# Patient Record
Sex: Female | Born: 1975 | Race: Black or African American | Hispanic: No | Marital: Single | Smoking: Never smoker
Health system: Southern US, Community
[De-identification: ages and names within clinical notes are randomized; demographics above are authoritative.]

## PROBLEM LIST (undated history)

## (undated) DIAGNOSIS — I10 Essential (primary) hypertension: Secondary | ICD-10-CM

## (undated) DIAGNOSIS — R569 Unspecified convulsions: Secondary | ICD-10-CM

---

## 2017-07-30 ENCOUNTER — Emergency Department (HOSPITAL_COMMUNITY): Payer: Medicaid Other

## 2017-07-30 ENCOUNTER — Inpatient Hospital Stay (HOSPITAL_COMMUNITY): Payer: Medicaid Other | Admitting: Certified Registered Nurse Anesthetist

## 2017-07-30 ENCOUNTER — Encounter (HOSPITAL_COMMUNITY): Payer: Self-pay | Admitting: Emergency Medicine

## 2017-07-30 ENCOUNTER — Inpatient Hospital Stay (HOSPITAL_COMMUNITY)
Admission: EM | Admit: 2017-07-30 | Discharge: 2017-09-01 | DRG: 020 | Disposition: A | Discharge status: Rehabilitation Facility | Payer: Medicaid Other | Attending: Neurosurgery | Admitting: Neurosurgery

## 2017-07-30 ENCOUNTER — Encounter (HOSPITAL_COMMUNITY)
Admission: EM | Disposition: A | Discharge status: Rehabilitation Facility | Payer: Self-pay | Source: Home / Self Care | Attending: Neurosurgery

## 2017-07-30 DIAGNOSIS — D72829 Elevated white blood cell count, unspecified: Secondary | ICD-10-CM | POA: Diagnosis not present

## 2017-07-30 DIAGNOSIS — Z09 Encounter for follow-up examination after completed treatment for conditions other than malignant neoplasm: Secondary | ICD-10-CM

## 2017-07-30 DIAGNOSIS — Z0189 Encounter for other specified special examinations: Secondary | ICD-10-CM

## 2017-07-30 DIAGNOSIS — Y95 Nosocomial condition: Secondary | ICD-10-CM | POA: Diagnosis not present

## 2017-07-30 DIAGNOSIS — G914 Hydrocephalus in diseases classified elsewhere: Secondary | ICD-10-CM | POA: Diagnosis present

## 2017-07-30 DIAGNOSIS — I606 Nontraumatic subarachnoid hemorrhage from other intracranial arteries: Principal | ICD-10-CM | POA: Diagnosis present

## 2017-07-30 DIAGNOSIS — D6489 Other specified anemias: Secondary | ICD-10-CM | POA: Diagnosis present

## 2017-07-30 DIAGNOSIS — Z823 Family history of stroke: Secondary | ICD-10-CM | POA: Diagnosis not present

## 2017-07-30 DIAGNOSIS — S06350S Traumatic hemorrhage of left cerebrum without loss of consciousness, sequela: Secondary | ICD-10-CM | POA: Diagnosis not present

## 2017-07-30 DIAGNOSIS — R509 Fever, unspecified: Secondary | ICD-10-CM | POA: Diagnosis not present

## 2017-07-30 DIAGNOSIS — Z781 Physical restraint status: Secondary | ICD-10-CM | POA: Diagnosis not present

## 2017-07-30 DIAGNOSIS — R5082 Postprocedural fever: Secondary | ICD-10-CM | POA: Diagnosis not present

## 2017-07-30 DIAGNOSIS — I611 Nontraumatic intracerebral hemorrhage in hemisphere, cortical: Secondary | ICD-10-CM | POA: Diagnosis not present

## 2017-07-30 DIAGNOSIS — R739 Hyperglycemia, unspecified: Secondary | ICD-10-CM | POA: Diagnosis not present

## 2017-07-30 DIAGNOSIS — J181 Lobar pneumonia, unspecified organism: Secondary | ICD-10-CM | POA: Diagnosis not present

## 2017-07-30 DIAGNOSIS — D62 Acute posthemorrhagic anemia: Secondary | ICD-10-CM | POA: Diagnosis not present

## 2017-07-30 DIAGNOSIS — E872 Acidosis: Secondary | ICD-10-CM | POA: Diagnosis present

## 2017-07-30 DIAGNOSIS — I609 Nontraumatic subarachnoid hemorrhage, unspecified: Secondary | ICD-10-CM

## 2017-07-30 DIAGNOSIS — K567 Ileus, unspecified: Secondary | ICD-10-CM | POA: Diagnosis not present

## 2017-07-30 DIAGNOSIS — I69018 Other symptoms and signs involving cognitive functions following nontraumatic subarachnoid hemorrhage: Secondary | ICD-10-CM | POA: Diagnosis not present

## 2017-07-30 DIAGNOSIS — J189 Pneumonia, unspecified organism: Secondary | ICD-10-CM

## 2017-07-30 DIAGNOSIS — R1313 Dysphagia, pharyngeal phase: Secondary | ICD-10-CM | POA: Diagnosis not present

## 2017-07-30 DIAGNOSIS — R569 Unspecified convulsions: Secondary | ICD-10-CM

## 2017-07-30 DIAGNOSIS — R1310 Dysphagia, unspecified: Secondary | ICD-10-CM | POA: Diagnosis not present

## 2017-07-30 DIAGNOSIS — G8191 Hemiplegia, unspecified affecting right dominant side: Secondary | ICD-10-CM | POA: Diagnosis present

## 2017-07-30 DIAGNOSIS — G40909 Epilepsy, unspecified, not intractable, without status epilepticus: Secondary | ICD-10-CM | POA: Diagnosis present

## 2017-07-30 DIAGNOSIS — E876 Hypokalemia: Secondary | ICD-10-CM | POA: Diagnosis present

## 2017-07-30 DIAGNOSIS — I69093 Ataxia following nontraumatic subarachnoid hemorrhage: Secondary | ICD-10-CM | POA: Diagnosis not present

## 2017-07-30 DIAGNOSIS — I10 Essential (primary) hypertension: Secondary | ICD-10-CM | POA: Diagnosis present

## 2017-07-30 DIAGNOSIS — I671 Cerebral aneurysm, nonruptured: Secondary | ICD-10-CM | POA: Diagnosis not present

## 2017-07-30 DIAGNOSIS — J9601 Acute respiratory failure with hypoxia: Secondary | ICD-10-CM

## 2017-07-30 DIAGNOSIS — R1312 Dysphagia, oropharyngeal phase: Secondary | ICD-10-CM | POA: Diagnosis not present

## 2017-07-30 DIAGNOSIS — A419 Sepsis, unspecified organism: Secondary | ICD-10-CM | POA: Diagnosis not present

## 2017-07-30 DIAGNOSIS — R1314 Dysphagia, pharyngoesophageal phase: Secondary | ICD-10-CM | POA: Diagnosis not present

## 2017-07-30 DIAGNOSIS — I629 Nontraumatic intracranial hemorrhage, unspecified: Secondary | ICD-10-CM | POA: Diagnosis not present

## 2017-07-30 DIAGNOSIS — G9349 Other encephalopathy: Secondary | ICD-10-CM | POA: Diagnosis present

## 2017-07-30 DIAGNOSIS — I610 Nontraumatic intracerebral hemorrhage in hemisphere, subcortical: Secondary | ICD-10-CM | POA: Diagnosis not present

## 2017-07-30 DIAGNOSIS — R269 Unspecified abnormalities of gait and mobility: Secondary | ICD-10-CM | POA: Diagnosis not present

## 2017-07-30 DIAGNOSIS — G9389 Other specified disorders of brain: Secondary | ICD-10-CM | POA: Diagnosis present

## 2017-07-30 DIAGNOSIS — I161 Hypertensive emergency: Secondary | ICD-10-CM | POA: Diagnosis present

## 2017-07-30 DIAGNOSIS — Z978 Presence of other specified devices: Secondary | ICD-10-CM | POA: Diagnosis not present

## 2017-07-30 DIAGNOSIS — J9690 Respiratory failure, unspecified, unspecified whether with hypoxia or hypercapnia: Secondary | ICD-10-CM

## 2017-07-30 DIAGNOSIS — Z4659 Encounter for fitting and adjustment of other gastrointestinal appliance and device: Secondary | ICD-10-CM

## 2017-07-30 DIAGNOSIS — I69151 Hemiplegia and hemiparesis following nontraumatic intracerebral hemorrhage affecting right dominant side: Secondary | ICD-10-CM | POA: Diagnosis not present

## 2017-07-30 DIAGNOSIS — I69919 Unspecified symptoms and signs involving cognitive functions following unspecified cerebrovascular disease: Secondary | ICD-10-CM | POA: Diagnosis not present

## 2017-07-30 DIAGNOSIS — I67848 Other cerebrovascular vasospasm and vasoconstriction: Secondary | ICD-10-CM | POA: Diagnosis present

## 2017-07-30 DIAGNOSIS — R4189 Other symptoms and signs involving cognitive functions and awareness: Secondary | ICD-10-CM | POA: Diagnosis present

## 2017-07-30 DIAGNOSIS — I69398 Other sequelae of cerebral infarction: Secondary | ICD-10-CM | POA: Diagnosis not present

## 2017-07-30 DIAGNOSIS — K9189 Other postprocedural complications and disorders of digestive system: Secondary | ICD-10-CM | POA: Diagnosis not present

## 2017-07-30 DIAGNOSIS — I619 Nontraumatic intracerebral hemorrhage, unspecified: Secondary | ICD-10-CM | POA: Diagnosis present

## 2017-07-30 HISTORY — PX: IR ANGIOGRAM FOLLOW UP STUDY: IMG697

## 2017-07-30 HISTORY — PX: IR TRANSCATH/EMBOLIZ: IMG695

## 2017-07-30 HISTORY — PX: IR 3D INDEPENDENT WKST: IMG2385

## 2017-07-30 HISTORY — PX: IR ANGIO VERTEBRAL SEL VERTEBRAL UNI R MOD SED: IMG5368

## 2017-07-30 HISTORY — DX: Unspecified convulsions: R56.9

## 2017-07-30 HISTORY — PX: IR ANGIO INTRA EXTRACRAN SEL INTERNAL CAROTID BILAT MOD SED: IMG5363

## 2017-07-30 HISTORY — PX: RADIOLOGY WITH ANESTHESIA: SHX6223

## 2017-07-30 HISTORY — DX: Essential (primary) hypertension: I10

## 2017-07-30 LAB — CBC
HCT: 28.2 % — ABNORMAL LOW (ref 36.0–46.0)
HEMOGLOBIN: 8.6 g/dL — AB (ref 12.0–15.0)
MCH: 26.6 pg (ref 26.0–34.0)
MCHC: 30.5 g/dL (ref 30.0–36.0)
MCV: 87.3 fL (ref 78.0–100.0)
Platelets: 287 10*3/uL (ref 150–400)
RBC: 3.23 MIL/uL — AB (ref 3.87–5.11)
RDW: 18.2 % — ABNORMAL HIGH (ref 11.5–15.5)
WBC: 13.1 10*3/uL — ABNORMAL HIGH (ref 4.0–10.5)

## 2017-07-30 LAB — RAPID URINE DRUG SCREEN, HOSP PERFORMED
AMPHETAMINES: NOT DETECTED
Barbiturates: NOT DETECTED
Benzodiazepines: NOT DETECTED
COCAINE: NOT DETECTED
OPIATES: NOT DETECTED
TETRAHYDROCANNABINOL: NOT DETECTED

## 2017-07-30 LAB — I-STAT ARTERIAL BLOOD GAS, ED
ACID-BASE EXCESS: 1 mmol/L (ref 0.0–2.0)
Bicarbonate: 26.8 mmol/L (ref 20.0–28.0)
O2 SAT: 100 %
TCO2: 28 mmol/L (ref 22–32)
pCO2 arterial: 47.8 mmHg (ref 32.0–48.0)
pH, Arterial: 7.355 (ref 7.350–7.450)
pO2, Arterial: 397 mmHg — ABNORMAL HIGH (ref 83.0–108.0)

## 2017-07-30 LAB — BASIC METABOLIC PANEL
ANION GAP: 14 (ref 5–15)
BUN: 14 mg/dL (ref 6–20)
CALCIUM: 8.6 mg/dL — AB (ref 8.9–10.3)
CO2: 17 mmol/L — AB (ref 22–32)
Chloride: 104 mmol/L (ref 101–111)
Creatinine, Ser: 0.79 mg/dL (ref 0.44–1.00)
Glucose, Bld: 121 mg/dL — ABNORMAL HIGH (ref 65–99)
Potassium: 2.6 mmol/L — CL (ref 3.5–5.1)
Sodium: 135 mmol/L (ref 135–145)

## 2017-07-30 LAB — BLOOD GAS, ARTERIAL
Acid-base deficit: 5.9 mmol/L — ABNORMAL HIGH (ref 0.0–2.0)
BICARBONATE: 18.2 mmol/L — AB (ref 20.0–28.0)
Drawn by: 51133
FIO2: 40
LHR: 14 {breaths}/min
O2 Saturation: 99.3 %
PATIENT TEMPERATURE: 97.8
PEEP/CPAP: 5 cmH2O
PO2 ART: 211 mmHg — AB (ref 83.0–108.0)
VT: 420 mL
pCO2 arterial: 30.5 mmHg — ABNORMAL LOW (ref 32.0–48.0)
pH, Arterial: 7.391 (ref 7.350–7.450)

## 2017-07-30 LAB — CBG MONITORING, ED: GLUCOSE-CAPILLARY: 134 mg/dL — AB (ref 65–99)

## 2017-07-30 LAB — I-STAT BETA HCG BLOOD, ED (MC, WL, AP ONLY)

## 2017-07-30 LAB — PROTIME-INR
INR: 1.07
Prothrombin Time: 13.8 seconds (ref 11.4–15.2)

## 2017-07-30 LAB — APTT: aPTT: 37 seconds — ABNORMAL HIGH (ref 24–36)

## 2017-07-30 LAB — MRSA PCR SCREENING: MRSA BY PCR: NEGATIVE

## 2017-07-30 SURGERY — RADIOLOGY WITH ANESTHESIA
Anesthesia: General

## 2017-07-30 MED ORDER — LORAZEPAM 2 MG/ML IJ SOLN
INTRAMUSCULAR | Status: AC
  Filled 2017-07-30: qty 1

## 2017-07-30 MED ORDER — ORAL CARE MOUTH RINSE
15.0000 mL | Freq: Four times a day (QID) | OROMUCOSAL | Status: DC
  Administered 2017-07-30: 15 mL via OROMUCOSAL

## 2017-07-30 MED ORDER — PANTOPRAZOLE SODIUM 40 MG PO TBEC: 40.0000 mg | DELAYED_RELEASE_TABLET | Freq: Every day | ORAL | Status: DC

## 2017-07-30 MED ORDER — PROPOFOL 1000 MG/100ML IV EMUL
5.0000 ug/kg/min | Freq: Once | INTRAVENOUS | Status: DC
  Administered 2017-07-30: 20 ug/kg/min via INTRAVENOUS

## 2017-07-30 MED ORDER — METOPROLOL TARTRATE 5 MG/5ML IV SOLN
2.5000 mg | INTRAVENOUS | Status: DC | PRN
  Administered 2017-07-30 – 2017-08-06 (×15): 5 mg via INTRAVENOUS
  Filled 2017-07-30 (×15): qty 5

## 2017-07-30 MED ORDER — LORAZEPAM 2 MG/ML IJ SOLN
2.0000 mg | Freq: Once | INTRAMUSCULAR | Status: AC
  Administered 2017-07-30: 2 mg via INTRAVENOUS

## 2017-07-30 MED ORDER — FENTANYL CITRATE (PF) 100 MCG/2ML IJ SOLN
50.0000 ug | INTRAMUSCULAR | Status: DC | PRN
  Administered 2017-07-30 – 2017-08-01 (×5): 50 ug via INTRAVENOUS
  Filled 2017-07-30 (×5): qty 2

## 2017-07-30 MED ORDER — IOPAMIDOL (ISOVUE-300) INJECTION 61%
INTRAVENOUS | Status: AC
  Filled 2017-07-30: qty 100

## 2017-07-30 MED ORDER — IOPAMIDOL (ISOVUE-300) INJECTION 61%
INTRAVENOUS | Status: AC
  Filled 2017-07-30: qty 150

## 2017-07-30 MED ORDER — ONDANSETRON 4 MG PO TBDP: 4.0000 mg | ORAL_TABLET | Freq: Four times a day (QID) | ORAL | Status: DC | PRN

## 2017-07-30 MED ORDER — LORAZEPAM 2 MG/ML IJ SOLN
INTRAMUSCULAR | Status: AC
  Administered 2017-07-30: 2 mg
  Filled 2017-07-30: qty 1

## 2017-07-30 MED ORDER — PROPOFOL 1000 MG/100ML IV EMUL
5.0000 ug/kg/min | Freq: Once | INTRAVENOUS | Status: DC
  Filled 2017-07-30: qty 100

## 2017-07-30 MED ORDER — LORAZEPAM 2 MG/ML IJ SOLN
INTRAMUSCULAR | Status: AC
  Filled 2017-07-30: qty 2

## 2017-07-30 MED ORDER — PANTOPRAZOLE SODIUM 40 MG PO PACK
40.0000 mg | PACK | Freq: Every day | ORAL | Status: DC
  Administered 2017-07-30 – 2017-08-05 (×6): 40 mg
  Filled 2017-07-30 (×6): qty 20

## 2017-07-30 MED ORDER — PHENYLEPHRINE 40 MCG/ML (10ML) SYRINGE FOR IV PUSH (FOR BLOOD PRESSURE SUPPORT)
PREFILLED_SYRINGE | INTRAVENOUS | Status: DC | PRN
  Administered 2017-07-30 (×2): 40 ug via INTRAVENOUS

## 2017-07-30 MED ORDER — ACETAMINOPHEN 650 MG RE SUPP
650.0000 mg | RECTAL | Status: DC | PRN
  Administered 2017-08-06: 650 mg via RECTAL
  Filled 2017-07-30: qty 1

## 2017-07-30 MED ORDER — SODIUM CHLORIDE 0.9 % IV SOLN
1000.0000 mg | Freq: Two times a day (BID) | INTRAVENOUS | Status: DC
  Administered 2017-07-30 – 2017-08-08 (×18): 1000 mg via INTRAVENOUS
  Filled 2017-07-30 (×18): qty 10

## 2017-07-30 MED ORDER — SODIUM CHLORIDE 0.9 % IV SOLN
INTRAVENOUS | Status: DC
  Administered 2017-07-30 – 2017-08-24 (×39): via INTRAVENOUS

## 2017-07-30 MED ORDER — SODIUM CHLORIDE 0.9 % IV SOLN
INTRAVENOUS | Status: DC | PRN
  Administered 2017-07-30 (×3): via INTRAVENOUS

## 2017-07-30 MED ORDER — ROCURONIUM BROMIDE 100 MG/10ML IV SOLN
INTRAVENOUS | Status: DC | PRN
  Administered 2017-07-30 (×4): 50 mg via INTRAVENOUS

## 2017-07-30 MED ORDER — CLEVIDIPINE BUTYRATE 0.5 MG/ML IV EMUL
0.0000 mg/h | INTRAVENOUS | Status: DC
  Administered 2017-07-30 (×3): 21 mg/h via INTRAVENOUS
  Administered 2017-07-31 (×2): 15 mg/h via INTRAVENOUS
  Administered 2017-07-31: 19 mg/h via INTRAVENOUS
  Administered 2017-07-31: 13 mg/h via INTRAVENOUS
  Administered 2017-07-31: 15 mg/h via INTRAVENOUS
  Administered 2017-07-31: 13 mg/h via INTRAVENOUS
  Administered 2017-07-31: 15 mg/h via INTRAVENOUS
  Administered 2017-07-31: 20 mg/h via INTRAVENOUS
  Administered 2017-08-01: 6 mg/h via INTRAVENOUS
  Administered 2017-08-01: 5 mg/h via INTRAVENOUS
  Administered 2017-08-01: 7 mg/h via INTRAVENOUS
  Administered 2017-08-02: 9 mg/h via INTRAVENOUS
  Administered 2017-08-02: 8 mg/h via INTRAVENOUS
  Administered 2017-08-02 (×2): 11 mg/h via INTRAVENOUS
  Administered 2017-08-02: 9 mg/h via INTRAVENOUS
  Administered 2017-08-03: 11 mg/h via INTRAVENOUS
  Administered 2017-08-04: 1 mg/h via INTRAVENOUS
  Administered 2017-08-05: 4 mg/h via INTRAVENOUS
  Administered 2017-08-05: 5 mg/h via INTRAVENOUS
  Administered 2017-08-06: 3 mg/h via INTRAVENOUS
  Filled 2017-07-30 (×25): qty 50

## 2017-07-30 MED ORDER — ETOMIDATE 2 MG/ML IV SOLN
INTRAVENOUS | Status: DC | PRN
  Administered 2017-07-30: 20 mg via INTRAVENOUS

## 2017-07-30 MED ORDER — SUCCINYLCHOLINE CHLORIDE 20 MG/ML IJ SOLN
INTRAMUSCULAR | Status: DC | PRN
  Administered 2017-07-30: 100 mg via INTRAVENOUS

## 2017-07-30 MED ORDER — NICARDIPINE HCL IN NACL 20-0.86 MG/200ML-% IV SOLN
0.0000 mg/h | INTRAVENOUS | Status: DC
  Administered 2017-07-30: 5 mg/h via INTRAVENOUS
  Filled 2017-07-30: qty 200

## 2017-07-30 MED ORDER — POTASSIUM CHLORIDE 10 MEQ/100ML IV SOLN
10.0000 meq | INTRAVENOUS | Status: AC
  Administered 2017-07-30 – 2017-07-31 (×4): 10 meq via INTRAVENOUS
  Filled 2017-07-30 (×4): qty 100

## 2017-07-30 MED ORDER — NIMODIPINE 30 MG PO CAPS
60.0000 mg | ORAL_CAPSULE | ORAL | Status: DC
  Filled 2017-07-30: qty 2

## 2017-07-30 MED ORDER — ACETAMINOPHEN 325 MG PO TABS: 650.0000 mg | ORAL_TABLET | ORAL | Status: DC | PRN

## 2017-07-30 MED ORDER — CHLORHEXIDINE GLUCONATE 0.12% ORAL RINSE (MEDLINE KIT)
15.0000 mL | Freq: Two times a day (BID) | OROMUCOSAL | Status: DC
  Administered 2017-07-30: 15 mL via OROMUCOSAL

## 2017-07-30 MED ORDER — HYDRALAZINE HCL 20 MG/ML IJ SOLN
10.0000 mg | INTRAMUSCULAR | Status: DC | PRN
  Administered 2017-07-30: 20 mg via INTRAVENOUS
  Filled 2017-07-30: qty 2

## 2017-07-30 MED ORDER — ACETAMINOPHEN 160 MG/5ML PO SOLN
650.0000 mg | ORAL | Status: DC | PRN
  Administered 2017-08-02 – 2017-08-27 (×38): 650 mg
  Filled 2017-07-30 (×41): qty 20.3

## 2017-07-30 MED ORDER — DOCUSATE SODIUM 100 MG PO CAPS: 100.0000 mg | ORAL_CAPSULE | Freq: Two times a day (BID) | ORAL | Status: DC

## 2017-07-30 MED ORDER — SODIUM CHLORIDE 0.9 % IV SOLN
1000.0000 mg | Freq: Once | INTRAVENOUS | Status: AC
  Administered 2017-07-30: 1000 mg via INTRAVENOUS
  Filled 2017-07-30: qty 10

## 2017-07-30 MED ORDER — CLEVIDIPINE BUTYRATE 0.5 MG/ML IV EMUL
0.0000 mg/h | INTRAVENOUS | Status: DC
  Administered 2017-07-30: 1 mg/h via INTRAVENOUS
  Filled 2017-07-30 (×3): qty 50

## 2017-07-30 MED ORDER — LACTATED RINGERS IV SOLN: INTRAVENOUS | Status: DC | PRN

## 2017-07-30 MED ORDER — IOPAMIDOL (ISOVUE-370) INJECTION 76%
INTRAVENOUS | Status: AC
  Filled 2017-07-30: qty 50

## 2017-07-30 MED ORDER — NIMODIPINE 60 MG/20ML PO SOLN
60.0000 mg | ORAL | Status: DC
  Administered 2017-07-30 – 2017-08-17 (×100): 60 mg
  Administered 2017-08-17: 30 mg
  Filled 2017-07-30 (×104): qty 20

## 2017-07-30 MED ORDER — IOPAMIDOL (ISOVUE-300) INJECTION 61%
INTRAVENOUS | Status: AC
  Administered 2017-07-30: 110 mL
  Filled 2017-07-30: qty 150

## 2017-07-30 MED ORDER — ONDANSETRON HCL 4 MG/2ML IJ SOLN: 4.0000 mg | Freq: Four times a day (QID) | INTRAMUSCULAR | Status: DC | PRN

## 2017-07-30 MED ORDER — POTASSIUM CHLORIDE 10 MEQ/100ML IV SOLN
10.0000 meq | INTRAVENOUS | Status: AC
  Administered 2017-07-30 (×2): 10 meq via INTRAVENOUS
  Filled 2017-07-30 (×2): qty 100

## 2017-07-30 MED ORDER — PROPOFOL 1000 MG/100ML IV EMUL
INTRAVENOUS | Status: AC
  Administered 2017-07-30: 20 ug/kg/min via INTRAVENOUS
  Filled 2017-07-30: qty 100

## 2017-07-30 MED ORDER — PROPOFOL 1000 MG/100ML IV EMUL
0.0000 ug/kg/min | INTRAVENOUS | Status: DC
  Administered 2017-07-31 – 2017-08-01 (×3): 30 ug/kg/min via INTRAVENOUS
  Filled 2017-07-30 (×3): qty 100

## 2017-07-30 MED ORDER — FENTANYL CITRATE (PF) 100 MCG/2ML IJ SOLN
50.0000 ug | INTRAMUSCULAR | Status: AC | PRN
  Administered 2017-07-31 – 2017-08-01 (×3): 50 ug via INTRAVENOUS
  Filled 2017-07-30 (×3): qty 2

## 2017-07-30 MED ORDER — MORPHINE SULFATE (PF) 4 MG/ML IV SOLN: 1.0000 mg | INTRAVENOUS | Status: DC | PRN

## 2017-07-30 MED ORDER — STROKE: EARLY STAGES OF RECOVERY BOOK
Freq: Once | Status: DC
  Filled 2017-07-30: qty 1

## 2017-07-30 NOTE — Anesthesia Procedure Notes (Signed)
Arterial Line Insertion Start/End9/13/2018 6:00 PM, 07/30/2017 6:11 PM Performed by: Izola PriceOCKFIELD JR, Yasmin Bronaugh WALTON, CRNA  Patient location: OR. Preanesthetic checklist: patient identified, IV checked, site marked, risks and benefits discussed, surgical consent, monitors and equipment checked, pre-op evaluation, timeout performed and anesthesia consent Left, radial was placed Catheter size: 20 G Hand hygiene performed , maximum sterile barriers used  and Seldinger technique used Allen's test indicative of satisfactory collateral circulation Attempts: 1 Procedure performed without using ultrasound guided technique. Following insertion, Biopatch and dressing applied. Patient tolerated the procedure well with no immediate complications.

## 2017-07-30 NOTE — Progress Notes (Signed)
Pts admission history reviewed. Pt unable to provide history, no family available. Per EMR and ER MD, pt presented likely post-ictal, very somnolent but intermittently FC, moving extremities. Had multiple subsequent SZ requiring intubation for airway protection. Unknown medical history.  EXAM:  BP (!) 170/126   Pulse (!) 143   Temp 97.8 F (36.6 C) (Oral)   Resp (!) 25   Wt 60 kg (132 lb 4.4 oz)   LMP  (LMP Unknown) Comment: neg preg test  SpO2 100%   On propofol, received rocuronium 10min ago Pupils 4mm, briskly reactive No motor responses  IMAGING: CTH reviewed, diffuse basal SAH with left orbito-frontal IPH suggesting carotid circulation aneurysm. No HCP.  IMPRESSION:  41 y.o. female Hunt-Hess 5, Fisher 4 SAH. Despite high-grade, she has been post-ictal suggesting her true grade after resuscitation may be lower. Given her young age I believe treatment is certainly reasonable.  PLAN: - Will proceed with angiogram, treatment of any identified aneurysm  No family is available for consent, pt is unable to consent for herself. I will therefore proceed under emergent circumstances with implied consent.

## 2017-07-30 NOTE — ED Triage Notes (Signed)
[place] reported pt was riding in the car with patient and witnessed full body jerking. Pt with hx of seizures and HTN. Emesis x1 for EMS. Given  zofran. CBG 114. bp prehospital 200/112. Pt arouses to voice. Oriented x3 at present. 20g LFA. C/o 10/10 headache. Pupils equal reactive. Seizure precautions in place.

## 2017-07-30 NOTE — Consult Note (Signed)
Reason for Consult:SAH Referring Physician: EDP  Suzanne Brooks is an 41 y.o. female.   HPI:  41 year old female with a history of seizure who was brought to the emergency department with a seizure today. She had 2 subsequent emergency department, and was intubated in a postictal state. CT scan showed diffuse subarachnoid hemorrhage. She is sedated and intubated and therefore cannot take a history from her lower and she cannot cooperate with physical exam.  Past Medical History:  Diagnosis Date  . Hypertension   . Seizures (HCC)     History reviewed. No pertinent surgical history.  No Known Allergies  Social History  Substance Use Topics  . Smoking status: Not on file  . Smokeless tobacco: Not on file  . Alcohol use Yes     Comment: occasional    History reviewed. No pertinent family history.   Review of Systems  Positive ROS: Unable to obtain    Objective: Vital signs in last 24 hours: Temp:  [97.8 F (36.6 C)] 97.8 F (36.6 C) (09/13 1456) Pulse Rate:  [66-143] 143 (09/13 1658) Resp:  [18-29] 25 (09/13 1658) BP: (156-219)/(113-146) 170/126 (09/13 1658) SpO2:  [97 %-100 %] 100 % (09/13 1658) FiO2 (%):  [100 %] 100 % (09/13 1637) Weight:  [60 kg (132 lb 4.4 oz)] 60 kg (132 lb 4.4 oz) (09/13 1659)  General Appearance: Sedated and intubated female, appears stated age Head: Normocephalic, without obvious abnormality, atraumatic Eyes: PERRL 5-3 mm bilaterally, is conjugate    Ears: Normal TM's and external ear canals, both ears Throat: Intubated Neck: Supple Lungs: Clear to auscultation bilaterally Heart: Tachycardic Abdomen: Soft Extremities: Extremities normal, atraumatic, no cyanosis or edema Pulses: 2+ and symmetric all extremities Skin: Skin color, texture, turgor normal, no rashes or lesions  NEUROLOGIC:   Mental status: Sedated and intubated, unable to test speech, fund of knowledge, memory or attention span Motor Exam - seems to move all extremities  spontaneously Sensory Exam - unable to examine Reflexes: symmetric, no pathologic reflexes, No Hoffman's, No clonus Coordination - unable to examine Gait - unable to test Balance - unable to test Cranial Nerves: I: smell Not tested  II: visual acuity  OS: na    OD: na  II: visual fields   II: pupils Equal and reactive   III,VII: ptosis   III,IV,VI: extraocular muscles    V: mastication   V: facial light touch sensation    V,VII: corneal reflex  Present   VII: facial muscle function - upper    VII: facial muscle function - lower   VIII: hearing   IX: soft palate elevation    IX,X: gag reflex Present   XI: trapezius strength    XI: sternocleidomastoid strength   XI: neck flexion strength    XII: tongue strength      Data Review Lab Results  Component Value Date   WBC 13.1 (H) 07/30/2017   HGB 8.6 (L) 07/30/2017   HCT 28.2 (L) 07/30/2017   MCV 87.3 07/30/2017   PLT 287 07/30/2017   Lab Results  Component Value Date   NA 135 07/30/2017   K 2.6 (LL) 07/30/2017   CL 104 07/30/2017   CO2 17 (L) 07/30/2017   BUN 14 07/30/2017   CREATININE 0.79 07/30/2017   GLUCOSE 121 (H) 07/30/2017   No results found for: INR, PROTIME  Radiology: Ct Head Wo Contrast  Result Date: 07/30/2017 CLINICAL DATA:  Altered mental status, history hypertension, seizures EXAM: CT HEAD WITHOUT CONTRAST TECHNIQUE: Contiguous axial  images were obtained from the base of the skull through the vertex without intravenous contrast. Sagittal and coronal MPR images reconstructed from axial data set. COMPARISON:  None FINDINGS: Brain: Extensive subarachnoid blood throughout the basilar cisterns, sylvian fissure, and interhemispheric fissure. Additional intra cerebral hematoma identified within inferior LEFT frontal region 3.5 x 1.4 x 1.8 cm. No hydrocephalus. Diffuse cerebral edema. Minimal RIGHT to LEFT midline shift 2 mm. No additional areas of intraparenchymal hemorrhage or infarction. No intraventricular  blood. Vascular: High suspicion of ruptured intracranial aneurysm though a discrete aneurysm is not visualized. Skull: Intact. Sinuses/Orbits: Visualized paranasal sinuses and mastoid air cells clear. Other: N/A IMPRESSION: Extensive subarachnoid hemorrhage throughout the basilar cisterns, sylvian fissure and interhemispheric fissure question ruptured aneurysm. Additional intra cerebral hematoma within the inferior LEFT frontal lobe 3.5 x 1.4 x 1.8 cm, un common to have intraparenchymal extension of subarachnoid hemorrhage, question concomitant or subsequent fall with traumatic hemorrhage? Further assessment by CTA imaging with contrast recommended. Critical Value/emergent results were called by telephone at the time of interpretation on 07/30/2017 at 4:44 pm to Dr. Shaune Pollack , who verbally acknowledged these results. Electronically Signed   By: Ulyses Southward M.D.   On: 07/30/2017 16:45   Dg Chest Port 1 View  Result Date: 07/30/2017 CLINICAL DATA:  Seizure activity, now 1 responsive, intubation of the trachea. EXAM: PORTABLE CHEST 1 VIEW COMPARISON:  None in PACs FINDINGS: The endotracheal tube tip lies approximately 1.6 cm above the carina. The esophagogastric tube tip in proximal port lie within the stomach. The lungs are reasonably well inflated. The interstitial markings are mildly prominent. There are coarse lung markings in the medial retrocardiac region on the left. There is no pleural effusion or pneumothorax. The cardiac silhouette is top-normal in size. The pulmonary vascularity is normal. IMPRESSION: Reasonable positioning of the endotracheal tube and esophagogastric tube. The lungs are adequately inflated. There is no definite pneumonia though subsegmental atelectasis at the left lung base medially may be present. Electronically Signed   By: Elaya Droege  [place] M.D.   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 I have spoken with our neurovascular surgeon who will evaluate the patient in the next 20-30 minutes and we'll take her to the angiographic suite for diagnostic and potentially therapeutic angiography.   Venisa Frampton S 07/30/2017 5:26 PM

## 2017-07-30 NOTE — Anesthesia Postprocedure Evaluation (Signed)
Anesthesia Post Note  Patient: Suzanne Brooks  Procedure(s) Performed: Procedure(s) (LRB): RADIOLOGY WITH ANESTHESIA/ DR. Gerlene BurdockNUNDKUMAR/IR (N/A)     Patient location during evaluation: SICU Anesthesia Type: General Level of consciousness: sedated Pain management: pain level controlled Vital Signs Assessment: post-procedure vital signs reviewed and stable Respiratory status: patient remains intubated per anesthesia plan Cardiovascular status: stable Postop Assessment: no apparent nausea or vomiting Anesthetic complications: no    Last Vitals:  Vitals:   07/30/17 2010 07/30/17 2030  BP:    Pulse:    Resp: 14   Temp:  36.6 C  SpO2:      Last Pain:  Vitals:   07/30/17 2030  TempSrc: Axillary  PainSc:                  Suzanne Brooks,W. EDMOND

## 2017-07-30 NOTE — Sedation Documentation (Signed)
Pt brought to IR 2 from emergency room, intubated accompanied by CRNA and RN. Sedated and unresponsive to verbal or tactile stimuli.

## 2017-07-30 NOTE — ED Notes (Signed)
Patient taken to IR by this RN, IR RN, and CRNA.  Patient tolerated move well.  Care handed off to IR Team

## 2017-07-30 NOTE — Brief Op Note (Signed)
DATE OF PROCEDURE: 07/30/17  PREOP DX: Subarachnoid Hemorrhage  POSTOP DX: Same  PROCEDURE: Diagnostic angiogram, coiling of left ophthalmic aneurysm  SURGEON: Julyana Woolverton  ANESTHESIA: GETA  EBL: Minimal  SPECIMENS: None  DRAINS: None  COMPLICATIONS: None immediate  CONDITION: Stable to ICU  FINDINGS:  1. 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 Coiled successfully with minimal residual and patency of the left ophthalmic a. after emoblization. 2. No other aneurysms, AVM, or fistulas.

## 2017-07-30 NOTE — ED Provider Notes (Signed)
[facility] DEPT Provider Note   CSN: [id number] Arrival date & time: 07/30/17  1444  History   Chief Complaint Chief Complaint  Patient presents with  . Seizures    HPI Caelynn Marshman is a 41 y.o. female.  This is a 41 year old female with no known PMH who presents via EMS with concern for seizure while riding in a car with her family member.  Blood glucose at scene 114.  No family members present at bedside, patient altered mentally and somnolent, level 5 caveat.  According to EMS patient had approximate 2 full minutes of body wide "jerking".  She apparently has a history of seizure and HTN.  She had emesis, one episode and was given 4 mg IV Zofran.  Her initial blood pressure was 200/112.   The history is provided by the EMS personnel. The history is limited by the condition of the patient.    Past Medical History:  Diagnosis Date  . Hypertension   . Seizures [facility])     Patient Active Problem List   Diagnosis Date Noted  . ICH (intracerebral hemorrhage) ([place]) 07/30/2017    History reviewed. No pertinent surgical history.  OB History    No data available       Home Medications    Prior to Admission medications   Not on File    Family History History reviewed. No pertinent family history.  Social History Social History  Substance Use Topics  . Smoking status: Not on file  . Smokeless tobacco: Not on file  . Alcohol use Yes     Comment: occasional     Allergies   Patient has no known allergies.   Review of Systems Review of Systems  Unable to perform ROS: Mental status change     Physical Exam Updated Vital Signs BP (!) 170/126   Pulse (!) 143   Temp 97.8 F (36.6 C) (Oral)   Resp (!) 25   Wt 60 kg (132 lb 4.4 oz)   LMP  (LMP Unknown) Comment: neg preg test  SpO2 100%   Physical Exam  Constitutional: She appears well-nourished. She appears lethargic. No distress.  HENT:  Head: Normocephalic and atraumatic.  Eyes: Pupils are equal,  round, and reactive to light. Conjunctivae are normal.  Neck: Neck supple.  Cardiovascular: Normal rate and regular rhythm.   No murmur heard. Pulmonary/Chest: Effort normal and breath sounds normal. No respiratory distress.  Abdominal: Soft. There is no tenderness.  Musculoskeletal: She exhibits no edema.  Neurological: She appears lethargic. She displays no tremor. She displays seizure activity. GCS eye subscore is 3. GCS verbal subscore is 3. GCS motor subscore is 6.  Unable to complete neuro exam due to patient being post-ictal.  Skin: Skin is warm. She is diaphoretic.  Psychiatric: She has a normal mood and affect.  Nursing note and vitals reviewed.    ED Treatments / Results  Labs (all labs ordered are listed, but only abnormal results are displayed) Labs Reviewed  BASIC METABOLIC PANEL - Abnormal; Notable for the following:       Result Value   Potassium 2.6 (*)    CO2 17 (*)    Glucose, Bld 121 (*)    Calcium 8.6 (*)    All other components within normal limits  CBC - Abnormal; Notable for the following:    WBC 13.1 (*)    RBC 3.23 (*)    Hemoglobin 8.6 (*)    HCT 28.2 (*)    RDW 18.2 (*)  All other components within normal limits  CBG MONITORING, ED - Abnormal; Notable for the following:    Glucose-Capillary 134 (*)    All other components within normal limits  I-STAT BETA HCG BLOOD, ED ([facility], [facility], [facility] ONLY)    EKG  EKG Interpretation  Date/Time:  Thursday July 30 2017 15:01:32 EDT Ventricular Rate:  82 PR Interval:    QRS Duration: 101 QT Interval:  433 QTC Calculation: 506 R Axis:   54 Text Interpretation:  Sinus rhythm Prolonged PR interval Borderline prolonged QT interval hypertrophic changes Abnormal ekg Confirmed by Carmin Muskrat [phone]) on 07/30/2017 4:16:59 PM       Radiology Ct Head Wo Contrast  Result Date: 07/30/2017 CLINICAL DATA:  Altered mental status, history hypertension, seizures EXAM: CT HEAD WITHOUT CONTRAST TECHNIQUE:  Contiguous axial images were obtained from the base of the skull through the vertex without intravenous contrast. Sagittal and coronal MPR images reconstructed from axial data set. COMPARISON:  None FINDINGS: Brain: Extensive subarachnoid blood throughout the basilar cisterns, sylvian fissure, and interhemispheric fissure. Additional intra cerebral hematoma identified within inferior LEFT frontal region 3.5 x 1.4 x 1.8 cm. No hydrocephalus. Diffuse cerebral edema. Minimal RIGHT to LEFT midline shift 2 mm. No additional areas of intraparenchymal hemorrhage or infarction. No intraventricular blood. Vascular: High suspicion of ruptured intracranial aneurysm though a discrete aneurysm is not visualized. Skull: Intact. Sinuses/Orbits: Visualized paranasal sinuses and mastoid air cells clear. Other: N/A IMPRESSION: Extensive subarachnoid hemorrhage throughout the basilar cisterns, sylvian fissure and interhemispheric fissure question ruptured aneurysm. Additional intra cerebral hematoma within the inferior LEFT frontal lobe 3.5 x 1.4 x 1.8 cm, un common to have intraparenchymal extension of subarachnoid hemorrhage, question concomitant or subsequent fall with traumatic hemorrhage? Further assessment by CTA imaging with contrast recommended. Critical Value/emergent results were called by telephone at the time of interpretation on 07/30/2017 at 4:44 pm to Dr. Aldona Lento , who verbally acknowledged these results. Electronically Signed   By: Lavonia Dana M.D.   On: 07/30/2017 16:45   Dg Chest Port 1 View  Result Date: 07/30/2017 CLINICAL DATA:  Seizure activity, now 1 responsive, intubation of the trachea. EXAM: PORTABLE CHEST 1 VIEW COMPARISON:  None in PACs FINDINGS: The endotracheal tube tip lies approximately 1.6 cm above the carina. The esophagogastric tube tip in proximal port lie within the stomach. The lungs are reasonably well inflated. The interstitial markings are mildly prominent. There are coarse lung  markings in the medial retrocardiac region on the left. There is no pleural effusion or pneumothorax. The cardiac silhouette is top-normal in size. The pulmonary vascularity is normal. IMPRESSION: Reasonable positioning of the endotracheal tube and esophagogastric tube. The lungs are adequately inflated. There is no definite pneumonia though subsegmental atelectasis at the left lung base medially may be present. Electronically Signed   By: David  Martinique M.D.   On: 07/30/2017 16:29    Procedures Procedure Name: Intubation Date/Time: 07/30/2017 4:28 PM Performed by: Aldona Lento Pre-anesthesia Checklist: Patient identified, Emergency Drugs available, Patient being monitored, Suction available and Timeout performed Oxygen Delivery Method: Nasal cannula Preoxygenation: Pre-oxygenation with 100% oxygen Induction Type: IV induction and Rapid sequence Ventilation: Nasal airway inserted- appropriate to patient size and Mask ventilation without difficulty Laryngoscope Size: Mac and 4 Grade View: Grade II Tube size: 7.5 mm Number of attempts: 1 Airway Equipment and Method: Stylet Secured at: 23 cm Tube secured with: ETT holder Difficulty Due To: Difficulty was unanticipated Comments: Tube moved back to 22 at the lip after  X-ray due to tube being right at the carina edge.      (including critical care time)  Medications Ordered in ED Medications  LORazepam (ATIVAN) 2 MG/ML injection (not administered)  etomidate (AMIDATE) injection (20 mg Intravenous Given 07/30/17 1611)  succinylcholine (ANECTINE) injection (100 mg Intravenous Given 07/30/17 1611)  clevidipine (CLEVIPREX) infusion 0.5 mg/mL (21 mg/hr Intravenous Rate/Dose Change 07/30/17 1658)  iopamidol (ISOVUE-370) 76 % injection (not administered)  iopamidol (ISOVUE-300) 61 % injection (not administered)  iopamidol (ISOVUE-300) 61 % injection (not administered)  potassium chloride 10 mEq in 100 mL IVPB (not administered)  LORazepam  (ATIVAN) injection 2 mg (2 mg Intravenous Given 07/30/17 1507)  LORazepam (ATIVAN) 2 MG/ML injection (2 mg  Given 07/30/17 1555)  levETIRAcetam (KEPPRA) 1,000 mg in sodium chloride 0.9 % 100 mL IVPB (0 mg Intravenous Stopped 07/30/17 1734)  propofol (DIPRIVAN) 1000 MG/100ML infusion (60 mcg/kg/min Intravenous Rate/Dose Change 07/30/17 1742)     Initial Impression / Assessment and Plan / ED Course  I have reviewed the triage vital signs and the nursing notes.  Pertinent labs & imaging results that were available during my care of the patient were reviewed by me and considered in my medical decision making (see chart for details).     This is a 41 year old female with no known PMH who presents via EMS with concern for seizure while riding in a car with her family member.    Limited history due to lack of family members present at bedside and no phone numbers to contact in her chart.  Unable to verify medical history given limited medical record here at this institution. According to EMS patient had 2 minutes of concern for tonic-clonic seizure activity.  Blood glucose is 114, no falls noted at the scene.   Repeat CBG 134 in ED. On first eval patient disoriented but able to state name, oxygen saturations >98% with good waveform, and good distal pulses. Exam noted as above and was limited due to patient's mental status.  Patient had repeat seizure activity shortly after arrival. Given 2 mg IV Ativan. CT head ordered. Patient meets criteria for status epilepticus.  While awaiting for CT, the  patient had another seizure episode and she was given 2 doses of 2 mg IV Ativan by nursing staff.  Patient had posturing as well on reexamination and verbally unresponsive with extremity stiffening and pupillary dilation but responsive and equal.  Blood pressure continued to be in the 200s.  Patient urgently intubated for airway protection given patient has refractory status epilepticus and concern for  intracranial pathology.  Propofol ordered for sedation, she was loaded with IV Keppra.  See procedure note above for details regarding intubation, no immediate complications noted.  Patient emergently taken to CT which displayed SAH and IPH in the left frontoinferior lobe measuring 3.5 x 1.4 x 1.8.   Clevidipine drip started for BP.  NSG called and evaluated patient. Patient candidate for IR intervention. No family members to discuss with regarding patient's care.  Final Clinical Impressions(s) / ED Diagnoses   Final diagnoses:  Seizure-like activity ([place])  Nontraumatic cortical hemorrhage of left cerebral hemisphere [facility])    New Prescriptions New Prescriptions   No medications on file     Aldona Lento, MD 07/30/17 Evalee Jefferson    Carmin Muskrat, MD 08/03/17 1100

## 2017-07-30 NOTE — ED Notes (Signed)
Called into room by tech because pt was seizing. Pts seizure lasted approx 30sec with full body jerking and eye deviation to the right. Pt medicated post seizure with  ativan.

## 2017-07-30 NOTE — Consult Note (Signed)
PULMONARY / CRITICAL CARE MEDICINE   Name: Suzanne Brooks MRN: [id number] DOB: 1976/06/13    ADMISSION DATE:  07/30/2017 CONSULTATION DATE:  9/13  REFERRING MD:  Dr. Conchita Paris  CHIEF COMPLAINT:  SAH  HISTORY OF PRESENT ILLNESS:   41 year old female with PMH significant for HTN and seizures. 9/13 she suffered seizure while riding in a car with a family member. EMS was called and the patient was alert upon their arrival, but suffered another seizure. She had another seizure in ED which responded to IV ativan. She was found to be profoundly hypertensive and was intubated for airway protection. Keppra was given and she was taken for CT head, which demonstrated SAH and IPH in the frontoinferior lobe measuring 3.5 x 1.4 x 1.8.  She was taken to IR where aneurysmal source was identified and coiled. Post-operatively she was taken to ICU on vent for monitoring. PCCM asked to assist with ICU care.    PAST MEDICAL HISTORY :  She  has a past medical history of Hypertension and Seizures (HCC).  PAST SURGICAL HISTORY: She  has no past surgical history on file.  No Known Allergies  No current facility-administered medications on file prior to encounter.    No current outpatient prescriptions on file prior to encounter.    FAMILY HISTORY:  Her has no family status information on file.    SOCIAL HISTORY: She  reports that she drinks alcohol. She reports that she does not use drugs.  REVIEW OF SYSTEMS:   unable  SUBJECTIVE:    VITAL SIGNS: BP (!) 115/95   Pulse (!) 139   Temp 97.8 F (36.6 C) (Axillary)   Resp 14   Wt 60 kg (132 lb 4.4 oz)   LMP  (LMP Unknown) Comment: neg preg test  SpO2 100%   HEMODYNAMICS:    VENTILATOR SETTINGS: Vent Mode: PRVC FiO2 (%):  [40 %-100 %] 40 % Set Rate:  [14 bmp] 14 bmp Vt Set:  [420 mL] 420 mL PEEP:  [5 cmH20] 5 cmH20 Plateau Pressure:  [14 cmH20-16 cmH20] 16 cmH20  INTAKE / OUTPUT: I/O last 3 completed shifts: In: 1000 [I.V.:1000] Out:  350 [Urine:350]  PHYSICAL EXAMINATION: General:  Middle aged female of normal body habitus on vent Neuro:  Sedated HEENT:  [place]/AT, Pupils unequal, Left dilated, no JVD Cardiovascular:  Tachy, regular, no MRG Lungs:  Clear, bilateral breath sounds Abdomen:  Soft, non-tender, non-distended Musculoskeletal: No acute deformity Skin:  Grossly intact  LABS:  BMET  Recent Labs Lab 07/30/17 1515  NA 135  K 2.6*  CL 104  CO2 17*  BUN 14  CREATININE 0.79  GLUCOSE 121*    Electrolytes  Recent Labs Lab 07/30/17 1515  CALCIUM 8.6*    CBC  Recent Labs Lab 07/30/17 1515  WBC 13.1*  HGB 8.6*  HCT 28.2*  PLT 287    Coag's No results for input(s): APTT, INR in the last 168 hours.  Sepsis Markers No results for input(s): LATICACIDVEN, PROCALCITON, O2SATVEN in the last 168 hours.  ABG  Recent Labs Lab 07/30/17 1807  PHART 7.355  PCO2ART 47.8  PO2ART 397.0*    Liver Enzymes No results for input(s): AST, ALT, ALKPHOS, BILITOT, ALBUMIN in the last 168 hours.  Cardiac Enzymes No results for input(s): TROPONINI, PROBNP in the last 168 hours.  Glucose  Recent Labs Lab 07/30/17 1509  GLUCAP 134*    Imaging Ct Head Wo Contrast  Result Date: 07/30/2017 CLINICAL DATA:  Altered mental status, history  hypertension, seizures EXAM: CT HEAD WITHOUT CONTRAST TECHNIQUE: Contiguous axial images were obtained from the base of the skull through the vertex without intravenous contrast. Sagittal and coronal MPR images reconstructed from axial data set. COMPARISON:  None FINDINGS: Brain: Extensive subarachnoid blood throughout the basilar cisterns, sylvian fissure, and interhemispheric fissure. Additional intra cerebral hematoma identified within inferior LEFT frontal region 3.5 x 1.4 x 1.8 cm. No hydrocephalus. Diffuse cerebral edema. Minimal RIGHT to LEFT midline shift 2 mm. No additional areas of intraparenchymal hemorrhage or infarction. No intraventricular blood. Vascular:  High suspicion of ruptured intracranial aneurysm though a discrete aneurysm is not visualized. Skull: Intact. Sinuses/Orbits: Visualized paranasal sinuses and mastoid air cells clear. Other: N/A IMPRESSION: Extensive subarachnoid hemorrhage throughout the basilar cisterns, sylvian fissure and interhemispheric fissure question ruptured aneurysm. Additional intra cerebral hematoma within the inferior LEFT frontal lobe 3.5 x 1.4 x 1.8 cm, un common to have intraparenchymal extension of subarachnoid hemorrhage, question concomitant or subsequent fall with traumatic hemorrhage? Further assessment by CTA imaging with contrast recommended. Critical Value/emergent results were called by telephone at the time of interpretation on 07/30/2017 at 4:44 pm to Dr. Shaune PollackBLAKE BRIGGS , who verbally acknowledged these results. Electronically Signed   By: Ulyses SouthwardMark  Boles M.D.   On: 07/30/2017 16:45   Dg Chest Port 1 View  Result Date: 07/30/2017 CLINICAL DATA:  Seizure activity, now 1 responsive, intubation of the trachea. EXAM: PORTABLE CHEST 1 VIEW COMPARISON:  None in PACs FINDINGS: The endotracheal tube tip lies approximately 1.6 cm above the carina. The esophagogastric tube tip in proximal port lie within the stomach. The lungs are reasonably well inflated. The interstitial markings are mildly prominent. There are coarse lung markings in the medial retrocardiac region on the left. There is no pleural effusion or pneumothorax. The cardiac silhouette is top-normal in size. The pulmonary vascularity is normal. IMPRESSION: Reasonable positioning of the endotracheal tube and esophagogastric tube. The lungs are adequately inflated. There is no definite pneumonia though subsegmental atelectasis at the left lung base medially may be present. Electronically Signed   By: David  SwazilandJordan M.D.   On: 07/30/2017 16:29     STUDIES:  CT head 9/13 > Extensive subarachnoid hemorrhage throughout the basilar cisterns, sylvian fissure and  interhemispheric fissure question ruptured aneurysm. Additional intra cerebral hematoma within the inferior LEFT frontal lobe 3.5 x 1.4 x 1.8 cm, un common to have intraparenchymal extension of subarachnoid hemorrhage, question concomitant or subsequent fall with traumatic hemorrhage?  CULTURES:   ANTIBIOTICS:   SIGNIFICANT EVENTS: 9/13 admit  LINES/TUBES: Ett 9/13 > Art line 9/13 >  DISCUSSION: 41 year old female who presented to ED with seizures found to be hypertensive with aneurysmal SAH, ICH. Coiled in IR 9/13. Post-op to ICU on vent.   ASSESSMENT / PLAN:  PULMONARY A: Inability to protect airway in setting SAH, IPH  P:   Full vent support ABG CXR VAP bundle  CARDIOVASCULAR A:  Hypertensive emergency  P:  ICU hemodynamic monitoring Clevidipine infusion for BP goal set [facility]surgery [facility] hydralazine, metoprolol  RENAL A:   Hypokalemia  P:   Give 4 runs K, repeat BMP Strict I&O  GASTROINTESTINAL A:   No acute issues  P:   NPO Protonix  HEMATOLOGIC A:   Anemia  P:  Follow CBC Transfuse per ICU guidelines  INFECTIOUS A:   Leukocytosis  P:   Monitor  ENDOCRINE A:   No acute issues  P:   Follow glucose  NEUROLOGIC A:   Diffuse  subarachnoid hemorrhage  Inferior left frontal ICH  P:   Aneurysm coiled in IR 9/13 RASS goal: -1 to -2 Propofol for sedation PRN fentanyl for analgesia Nimodipine Repeat imaging per Neurosurgery   FAMILY  - Updates:   - Inter-disciplinary family meet or Palliative Care meeting due by:  9/20  Joneen Roach, AGACNP-BC [facility] Pager [phone] or [phone]  07/30/2017 9:10 PM

## 2017-07-30 NOTE — ED Notes (Signed)
Date and time results received: 07/30/17 1614   Test: Potassium  Critical Value: 2.6   Name of Provider Notified: Briggs/Lockwood MD   Orders Received? Or Actions Taken?:  No new orders

## 2017-07-30 NOTE — ED Notes (Signed)
Pt CBG was 134, notified Nicole(RN)

## 2017-07-30 NOTE — Anesthesia Preprocedure Evaluation (Addendum)
Anesthesia Evaluation  Patient identified by MRN, date of birth, ID band Patient unresponsive    Reviewed: Allergy & Precautions, NPO status , Patient's Chart, lab work & pertinent test results  Airway Mallampati: Intubated       Dental no notable dental hx. (+) Teeth Intact, Dental Advisory Given   Pulmonary  Intubated and sedated   Pulmonary exam normal breath sounds clear to auscultation       Cardiovascular hypertension, Normal cardiovascular exam Rhythm:Regular Rate:Normal  ECG: SR, rate 82   Neuro/Psych Seizures -, Poorly Controlled,  ANEURSYM CT scan showed diffuse subarachnoid hemorrhage inferior left frontal hemorrhage   ruptured intracranial aneurysm negative psych ROS   GI/Hepatic negative GI ROS, Neg liver ROS,   Endo/Other  negative endocrine ROS  Renal/GU negative Renal ROS     Musculoskeletal negative musculoskeletal ROS (+)   Abdominal   Peds  Hematology  (+) anemia ,   Anesthesia Other Findings Hypokalemia   Reproductive/Obstetrics                            Anesthesia Physical Anesthesia Plan  ASA: IV and emergent  Anesthesia Plan: General   Post-op Pain Management:    Induction: Intravenous  PONV Risk Score and Plan: 3 and Ondansetron, Dexamethasone and Treatment may vary due to age or medical condition  Airway Management Planned: Oral ETT  Additional Equipment: Arterial line  Intra-op Plan:   Post-operative Plan: Post-operative intubation/ventilation  Informed Consent: I have reviewed the patients History and Physical, chart, labs and discussed the procedure including the risks, benefits and alternatives for the proposed anesthesia with the patient or authorized representative who has indicated his/her understanding and acceptance.   Dental advisory given  Plan Discussed with: CRNA  Anesthesia Plan Comments:        Anesthesia Quick  Evaluation

## 2017-07-30 NOTE — ED Notes (Signed)
  Roc given by CRNA

## 2017-07-30 NOTE — Transfer of Care (Signed)
Immediate Anesthesia Transfer of Care Note  Patient: Suzanne CatenaChristie Brooks  Procedure(s) Performed: Procedure(s): RADIOLOGY WITH ANESTHESIA/ DR. Gerlene BurdockNUNDKUMAR/IR (N/A)  Patient Location: ICU  Anesthesia Type:General  Level of Consciousness: Patient remains intubated per anesthesia plan  Airway & Oxygen Therapy: Patient remains intubated per anesthesia plan and Patient placed on Ventilator (see vital sign flow sheet for setting)  Post-op Assessment: Report given to RN and Post -op Vital signs reviewed and stable  Post vital signs: Reviewed and stable  Last Vitals:  Vitals:   07/30/17 1800 07/30/17 2010  BP: (!) 115/95   Pulse: (!) 139   Resp: (!) 28 14  Temp:    SpO2: 100%     Last Pain:  Vitals:   07/30/17 1456  TempSrc: Oral  PainSc: 10-Worst pain ever         Complications: No apparent anesthesia complications

## 2017-07-31 ENCOUNTER — Inpatient Hospital Stay (HOSPITAL_BASED_OUTPATIENT_CLINIC_OR_DEPARTMENT_OTHER): Payer: Medicaid Other

## 2017-07-31 ENCOUNTER — Inpatient Hospital Stay (HOSPITAL_COMMUNITY): Payer: Medicaid Other

## 2017-07-31 ENCOUNTER — Encounter (HOSPITAL_COMMUNITY): Payer: Self-pay | Admitting: Radiology

## 2017-07-31 DIAGNOSIS — I609 Nontraumatic subarachnoid hemorrhage, unspecified: Secondary | ICD-10-CM | POA: Diagnosis not present

## 2017-07-31 DIAGNOSIS — J9601 Acute respiratory failure with hypoxia: Secondary | ICD-10-CM

## 2017-07-31 LAB — BASIC METABOLIC PANEL
ANION GAP: 8 (ref 5–15)
BUN: <5 mg/dL — ABNORMAL LOW (ref 6–20)
CO2: 20 mmol/L — ABNORMAL LOW (ref 22–32)
Calcium: 7.5 mg/dL — ABNORMAL LOW (ref 8.9–10.3)
Chloride: 107 mmol/L (ref 101–111)
Creatinine, Ser: 0.74 mg/dL (ref 0.44–1.00)
GFR calc Af Amer: >60 mL/min (ref >60)
Glucose, Bld: 128 mg/dL — ABNORMAL HIGH (ref 65–99)
POTASSIUM: 2.9 mmol/L — AB (ref 3.5–5.1)
SODIUM: 135 mmol/L (ref 135–145)

## 2017-07-31 LAB — HIV ANTIBODY (ROUTINE TESTING W REFLEX): HIV SCREEN 4TH GENERATION: NONREACTIVE

## 2017-07-31 LAB — PHOSPHORUS: Phosphorus: 2.8 mg/dL (ref 2.5–4.6)

## 2017-07-31 LAB — GLUCOSE, CAPILLARY
Glucose-Capillary: 137 mg/dL — ABNORMAL HIGH (ref 65–99)
Glucose-Capillary: 138 mg/dL — ABNORMAL HIGH (ref 65–99)
Glucose-Capillary: 143 mg/dL — ABNORMAL HIGH (ref 65–99)

## 2017-07-31 LAB — MAGNESIUM: MAGNESIUM: 1.4 mg/dL — AB (ref 1.7–2.4)

## 2017-07-31 MED ORDER — VITAL HIGH PROTEIN PO LIQD
1000.0000 mL | ORAL | Status: DC
  Administered 2017-07-31: 17:00:00
  Administered 2017-07-31: 1000 mL
  Administered 2017-07-31 – 2017-08-01 (×6)

## 2017-07-31 MED ORDER — CHLORHEXIDINE GLUCONATE 0.12% ORAL RINSE (MEDLINE KIT)
15.0000 mL | Freq: Two times a day (BID) | OROMUCOSAL | Status: DC
  Administered 2017-07-31 (×2): 15 mL via OROMUCOSAL

## 2017-07-31 MED ORDER — ORAL CARE MOUTH RINSE
15.0000 mL | OROMUCOSAL | Status: DC
  Administered 2017-07-31 – 2017-08-01 (×12): 15 mL via OROMUCOSAL

## 2017-07-31 MED ORDER — ADULT MULTIVITAMIN LIQUID CH
15.0000 mL | Freq: Every day | ORAL | Status: DC
  Administered 2017-07-31 – 2017-09-01 (×30): 15 mL
  Filled 2017-07-31 (×30): qty 15

## 2017-07-31 MED ORDER — MAGNESIUM SULFATE 4 GM/100ML IV SOLN
4.0000 g | Freq: Once | INTRAVENOUS | Status: AC
  Administered 2017-07-31: 4 g via INTRAVENOUS
  Filled 2017-07-31: qty 100

## 2017-07-31 MED ORDER — POTASSIUM CHLORIDE 10 MEQ/100ML IV SOLN
10.0000 meq | INTRAVENOUS | Status: AC
  Administered 2017-07-31 (×3): 10 meq via INTRAVENOUS
  Filled 2017-07-31 (×3): qty 100

## 2017-07-31 MED ORDER — POTASSIUM CHLORIDE 20 MEQ/15ML (10%) PO SOLN
40.0000 meq | Freq: Once | ORAL | Status: AC
  Administered 2017-07-31: 40 meq
  Filled 2017-07-31: qty 30

## 2017-07-31 MED ORDER — LORAZEPAM 2 MG/ML IJ SOLN
INTRAMUSCULAR | Status: AC
  Filled 2017-07-31: qty 1

## 2017-07-31 MED ORDER — PRO-STAT SUGAR FREE PO LIQD
60.0000 mL | Freq: Two times a day (BID) | ORAL | Status: DC
  Administered 2017-07-31: 60 mL
  Filled 2017-07-31: qty 60

## 2017-07-31 NOTE — Procedures (Signed)
PROCEDURE: Right frontal ventriculostomy   SURGEON: Dr. Lisbeth Renshaw, MD  ANESTHESIA: IV Sedation (propofol and fentanyl) with Local  EBL: Minimal  SPECIMENS: None  COMPLICATIONS: None  CONDITION: Hemodynamically stable  INDICATIONS: Mrs. Suzanne Brooks is a 41 y.o. female in the ICU for subarachnoid hemorrhage. CT scans overnight have demonstrated progressive ventriculomegaly. EVD is therefore indicated. Indications, risks, benefits, and alternatives were reviewed with the patient's family. After all questions were answered, consent was obtained.  PROCEDURE IN DETAIL: After consent was obtained from the patient's family, skin of the right frontal scalp was clipped, prepped and draped in the usual sterile fashion.  Scalp was then infiltrated with local anesthetic with epinephrine.  Skin incision was made sharply, and twist drill burr hole was made.  The dura was then incised, and the ventricular catheter was passed in two attempts into the right lateral ventricle.  Good CSF flow was obtained.  The catheter was then tunneled subcutaneously and connected to a drainage system and the skin incision closed.  The drain was then secured in place.  FINDINGS: 1. Opening pressure >20cm H2O 2. Clear CSF

## 2017-07-31 NOTE — Progress Notes (Signed)
Pt started having intermittent facial twitching and will only intermittently FC.  Notified neurology of this.  He stated that he did not see any seizures on the EEG at this time.  Notified Dr. Conchita Paris and he will contact neurology after he is done with his procedure to follow up.  Will continue to monitor.

## 2017-07-31 NOTE — Progress Notes (Signed)
eLink Physician-Brief Progress Note Patient Name: Suzanne Brooks DOB: Mar 24, 1976 MRN: [id number]   Date of Service  07/31/2017  HPI/Events of Note    eICU Interventions  Hypokalemia / hypomag  -repleted      Intervention Category Intermediate Interventions: Electrolyte abnormality - evaluation and management  ALVA,RAKESH V. 07/31/2017, 6:09 PM

## 2017-07-31 NOTE — Progress Notes (Signed)
PULMONARY / CRITICAL CARE MEDICINE   Name: Suzanne Brooks MRN: [id number] DOB: 1976-10-14    ADMISSION DATE:  07/30/2017 CONSULTATION DATE:  07/30/2017   REFERRING MD:  Dr Conchita Paris  BRIEF:   41 year old female with PMH significant for HTN and seizures. 9/13 she suffered seizure while riding in a car with a family member. EMS was called and the patient was alert upon their arrival, but suffered another seizure. She had another seizure in ED which responded to IV ativan. She was found to be profoundly hypertensive and was intubated for airway protection. Keppra was given and she was taken for CT head, which demonstrated SAH and IPH in the frontoinferior lobe measuring 3.5 x 1.4 x 1.8.  She was taken to IR where aneurysmal source was identified and coiled. Post-operatively she was taken to ICU on vent for monitoring. PCCM asked to assist with ICU care.    SUBJECTIVE/OVERNIGHT/INTERVAL HX 07/31/17  - has 5 kids (age 74-26) and mom and fiancee. Has hydrocephalus and is now for c-eeg and ventriculostomy. Remains on vent   VITAL SIGNS: BP 127/82   Pulse (!) 114   Temp 98.9 F (37.2 C) (Oral)   Resp (!) 29   Ht  (1.6 m)   Wt 62.7 kg (138 lb 3.7 oz)   LMP  (LMP Unknown) Comment: neg preg test  SpO2 100%   BMI 24.49 kg/m   HEMODYNAMICS:    VENTILATOR SETTINGS: Vent Mode: PRVC FiO2 (%):  [30 %-100 %] 30 % Set Rate:  [14 bmp] 14 bmp Vt Set:  [420 mL] 420 mL PEEP:  [5 cmH20] 5 cmH20 Plateau Pressure:  [14 cmH20-16 cmH20] 16 cmH20  INTAKE / OUTPUT: I/O last 3 completed shifts: In: 4497.4 [I.V.:3987.4; IV Piggyback:510] Out: 3685 [Urine:3675; Blood:10]  PHYSICAL EXAMINATION:  General Appearance:    Looks criticall ill  Head:    Normocephalic, without obvious abnormality, atraumatic  Eyes:    PERRL - yes, conjunctiva/corneas - clear  Ears:    Normal external ear canals, both ears  Nose:   NG tube - no  Throat:  ETT TUBE - yes , OG tube - yes  Neck:   Supple,  No  enlargement/tenderness/nodules     Lungs:     Clear to auscultation bilaterally, Ventilator   Synchrony - yes  Chest wall:    No deformity  Heart:    S1 and S2 normal, no murmur, CVP - no.  Pressors - no  Abdomen:     Soft, no masses, no organomegaly  Genitalia:    Not done  Rectal:   not done  Extremities:   Extremities- intact     Skin:   Intact in exposed areas .     Neurologic:   Sedation - diprivan gtt -> RASS - -3     LABS:  PULMONARY  Recent Labs Lab 07/30/17 1807 07/30/17 2245  PHART 7.355 7.391  PCO2ART 47.8 30.5*  PO2ART 397.0* 211*  HCO3 26.8 18.2*  TCO2 28  --   O2SAT 100.0 99.3    CBC  Recent Labs Lab 07/30/17 1515  HGB 8.6*  HCT 28.2*  WBC 13.1*  PLT 287    COAGULATION  Recent Labs Lab 07/30/17 2039  INR 1.07    CARDIAC  No results for input(s): TROPONINI in the last 168 hours. No results for input(s): PROBNP in the last 168 hours.   CHEMISTRY  Recent Labs Lab 07/30/17 1515  NA 135  K 2.6*  CL 104  CO2 17*  GLUCOSE 121*  BUN 14  CREATININE 0.79  CALCIUM 8.6*   Estimated Creatinine Clearance: 75 mL/min (by C-G formula based on SCr of 0.79 mg/dL).   LIVER  Recent Labs Lab 07/30/17 2039  INR 1.07     INFECTIOUS No results for input(s): LATICACIDVEN, PROCALCITON in the last 168 hours.   ENDOCRINE CBG (last 3)   Recent Labs  07/30/17 1509  GLUCAP 134*         IMAGING x48h  - image(s) personally visualized  -   highlighted in bold Ct Head Wo Contrast  Result Date: 07/31/2017 CLINICAL DATA:  Followup intracranial hemorrhage. EXAM: CT HEAD WITHOUT CONTRAST TECHNIQUE: Contiguous axial images were obtained from the base of the skull through the vertex without intravenous contrast. COMPARISON:  07/31/2017 and 07/30/2017 FINDINGS: Brain: Examination demonstrates no change in an oval acute intraparenchymal hematoma over the left frontal lobe measuring 1.3 x 3.6 cm. No change in extensive acute subarachnoid  hemorrhage over the basal cisterns and convexities. Subtle amount of intraventricular hemorrhage unchanged. No evidence of midline shift. Vascular: Embolization material projected over the left anterior aspect of the circle of Willis near the left ophthalmic artery unchanged. Skull: Normal. Negative for fracture or focal lesion. Sinuses/Orbits: No acute finding. Mild opacification over the inferior left mastoid air cells. Other: None. IMPRESSION: Stable acute intraparenchymal hematoma over the left frontal lobe measuring 1.3 x 3.6 cm. Stable moderate acute subarachnoid hemorrhage over the basal cisterns and convexities. Minimal stable intraventricular hemorrhage. Embolization material projected in the region of the left anterior circle Willis unchanged. Electronically Signed   By: Elberta Fortis M.D.   On: 07/31/2017 08:12   Ct Head Wo Contrast  Result Date: 07/31/2017 CLINICAL DATA:  - 41 y/o F; post transarterial intervention without movement of the lower extremities and altered mental status. EXAM: CT HEAD WITHOUT CONTRAST TECHNIQUE: Contiguous axial images were obtained from the base of the skull through the vertex without intravenous contrast. COMPARISON:  07/30/2017 CT head FINDINGS: Brain: Stable acute hemorrhage within the left inferior frontal lobe measuring 36 x 14 x 17 mm. Stable large volume of subarachnoid hemorrhage over the cerebral convexities, within basilar cisterns, and extending inferiorly in the prepontine cistern to the foramen magnum. Stable small volume of intraventricular hemorrhage within occipital horns of lateral ventricles, third ventricle, and fourth ventricle. No evidence for new acute intracranial hemorrhage or large territory infarction. The interval mild dilatation of the lateral and third ventricles compatible with mild hydrocephalus. Vascular: Coil mass in the left ophthalmic ICA region. Skull: Normal. Negative for fracture or focal lesion. Sinuses/Orbits: No acute finding.  Other: None. IMPRESSION: 1. Interval mild dilatation of lateral and third ventricles compatible with developing hydrocephalus. 2. No evidence for new acute intracranial hemorrhage or large infarction. 3. Stable acute hemorrhage within the left inferior frontal lobe, diffuse large volume subarachnoid hemorrhage, and small volume intraventricular hemorrhage. These results will be called to the ordering clinician or representative by the Radiologist Assistant, and communication documented in the PACS or zVision Dashboard. Electronically Signed   By: Mitzi Hansen M.D.   On: 07/31/2017 01:41   Ct Head Wo Contrast  Result Date: 07/30/2017 CLINICAL DATA:  Altered mental status, history hypertension, seizures EXAM: CT HEAD WITHOUT CONTRAST TECHNIQUE: Contiguous axial images were obtained from the base of the skull through the vertex without intravenous contrast. Sagittal and coronal MPR images reconstructed from axial data set. COMPARISON:  None FINDINGS: Brain: Extensive subarachnoid blood throughout the basilar cisterns, sylvian fissure,  and interhemispheric fissure. Additional intra cerebral hematoma identified within inferior LEFT frontal region 3.5 x 1.4 x 1.8 cm. No hydrocephalus. Diffuse cerebral edema. Minimal RIGHT to LEFT midline shift 2 mm. No additional areas of intraparenchymal hemorrhage or infarction. No intraventricular blood. Vascular: High suspicion of ruptured intracranial aneurysm though a discrete aneurysm is not visualized. Skull: Intact. Sinuses/Orbits: Visualized paranasal sinuses and mastoid air cells clear. Other: N/A IMPRESSION: Extensive subarachnoid hemorrhage throughout the basilar cisterns, sylvian fissure and interhemispheric fissure question ruptured aneurysm. Additional intra cerebral hematoma within the inferior LEFT frontal lobe 3.5 x 1.4 x 1.8 cm, un common to have intraparenchymal extension of subarachnoid hemorrhage, question concomitant or subsequent fall with  traumatic hemorrhage? Further assessment by CTA imaging with contrast recommended. Critical Value/emergent results were called by telephone at the time of interpretation on 07/30/2017 at 4:44 pm to Dr. Shaune Pollack , who verbally acknowledged these results. Electronically Signed   By: Ulyses Southward M.D.   On: 07/30/2017 16:45   Dg Chest Port 1 View  Result Date: 07/30/2017 CLINICAL DATA:  Seizure activity, now 1 responsive, intubation of the trachea. EXAM: PORTABLE CHEST 1 VIEW COMPARISON:  None in PACs FINDINGS: The endotracheal tube tip lies approximately 1.6 cm above the carina. The esophagogastric tube tip in proximal port lie within the stomach. The lungs are reasonably well inflated. The interstitial markings are mildly prominent. There are coarse lung markings in the medial retrocardiac region on the left. There is no pleural effusion or pneumothorax. The cardiac silhouette is top-normal in size. The pulmonary vascularity is normal. IMPRESSION: Reasonable positioning of the endotracheal tube and esophagogastric tube. The lungs are adequately inflated. There is no definite pneumonia though subsegmental atelectasis at the left lung base medially may be present. Electronically Signed   By: David  [place] M.D.   On: 07/30/2017 16:29   '  ASSESSMENT / PLAN:  PULMONARY A: Acute resp failure - does not meet sbt criteria P:   Full vent suppot  CARDIOVASCULAR A:  On cleviprex P:  sbp > 110 per NSGY  RENAL A:   Severe low k - repleted P:   Recheck and monitor  GASTROINTESTINAL A:   npo P:   Tube feeds ppi  HEMATOLOGIC A:   Anemia critical illness P:  - PRBC for hgb </= 6.9gm%    - exceptions are   -  if ACS susepcted/confirmed then transfuse for hgb </= 8.0gm%,  or    -  active bleeding with hemodynamic instability, then transfuse regardless of hemoglobin value   At at all times try to transfuse 1 unit prbc as possible with exception of active hemorrhage    INFECTIOUS A:    No evidence of infection P:   monitor  ENDOCRINE A:   No acute issues   P:   ssi  NEUROLOGIC A:   Hx of seizures SAH with s/p coiling 07/30/2017 and now hydrocephalus P:   RASS goal: 0 NSGY managing - for ventric    FAMILY  - Updates:  No fmaily at bedside. Updated by NSGY  - Inter-disciplinary family meet or Palliative Care meeting due by:  Day 7 from admit date of 07/30/2017      The patient is critically ill with multiple organ systems failure and requires high complexity decision making for assessment and support, frequent evaluation and titration of therapies, application of advanced monitoring technologies and extensive interpretation of multiple databases.   Critical Care Time devoted to patient care services described in this note  is  30  Minutes. This time reflects time of care of this signee Dr Kalman Shan. This critical care time does not reflect procedure time, or teaching time or supervisory time of PA/NP/Med student/Med Resident etc but could involve care discussion time    Dr. Kalman Shan, M.D., [facility].C.P Pulmonary and Critical Care Medicine Staff Physician  System [facility] Pager: [phone], If no answer or between  15:00h - 7:00h: call [phone]  07/31/2017 10:40 AM

## 2017-07-31 NOTE — Progress Notes (Signed)
PT Cancellation Note  Patient Details Name: Suzanne Brooks MRN: [id number] DOB: 07/24/1976   Cancelled Treatment:    Reason Eval/Treat Not Completed: Patient not medically ready.   Vena Austria 07/31/2017, 10:34 AM Durenda Hurt. Renaldo Fiddler, [facility] Acute Rehab Services Pager [phone]

## 2017-07-31 NOTE — Progress Notes (Signed)
Transcranial Doppler  Date POD PCO2 HCT BP  MCA ACA PCA OPHT SIPH VERT Basilar  9-14 vs     Right  Left   49  44   -14  -44   21  37   -30  -35     -37         Right  Left                                            Right  Left                                             Right  Left                                             Right  Left                                            Right  Left                                            Right  Left                                        MCA = Middle Cerebral Artery      OPHT = Opthalmic Artery     BASILAR = Basilar Artery   ACA = Anterior Cerebral Artery     SIPH = Carotid Siphon PCA = Posterior Cerebral Artery   VERT = Verterbral Artery                   Normal MCA = 62+\-12 ACA = 50+\-12 PCA = 42+\-23   [employer], RVS 07/31/2017, 1:42 PM

## 2017-07-31 NOTE — Progress Notes (Signed)
SLP Cancellation Note  Patient Details Name: Suzanne Brooks MRN: [id number] DOB: [birth year]   Cancelled treatment:       Reason Eval/Treat Not Completed: Patient not medically ready   Stuart Guillen, [name] 07/31/2017, 7:20 AM

## 2017-07-31 NOTE — Progress Notes (Signed)
OT Cancellation Note  Patient Details Name: Suzanne Brooks MRN: [id number] DOB: Feb 05, 1976   Cancelled Treatment:    Reason Eval/Treat Not Completed: Patient not medically ready (bedrest with neuro ongoing assessment)  Harolyn Rutherford  [phone] 07/31/2017, 7:17 AM

## 2017-07-31 NOTE — Progress Notes (Signed)
vLTM EEG running. Notified Neuro 

## 2017-07-31 NOTE — Progress Notes (Signed)
Initial Nutrition Assessment  INTERVENTION:   Vital High Protein @ 10 ml/hr (240 ml/day) 60 ml Prostat BID MVI daily  Provides: 640 kcal, 81 grams protein, and 200 ml free water. TF regimen and propofol at current rate providing 2365 total kcal/day (exceeds needs due to Cleviprex/Propofol)  NUTRITION DIAGNOSIS:   Inadequate oral intake related to inability to eat as evidenced by NPO status.  GOAL:   Patient will meet greater than or equal to 90% of their needs  MONITOR:   Vent status, TF tolerance, I & O's  REASON FOR ASSESSMENT:   Consult, Ventilator Enteral/tube feeding initiation and management  ASSESSMENT:   41 year old female who presented to ED with seizures found to be hypertensive with aneurysmal SAH, ICH. Coiled in IR 9/13. Post-op to ICU on vent.   Pt discussed during ICU rounds and with RN.   Patient is currently intubated on ventilator support MV: 12.5 L/min Temp (24hrs), Avg:98.4 F (36.9 C), Min:97.8 F (36.6 C), Max:99.1 F (37.3 C)  Propofol: 10.8 ml/hr provides: 285 kcal   Cleviprex: 30 ml/hr provides: 1440 kcal Medications reviewed and include: colace Labs reviewed: K+ 2.6 (L)   Diet Order:  Diet NPO time specified  Skin:  Reviewed, no issues  Last BM:  unknown  Height:   Ht Readings from Last 1 Encounters:  07/30/17  (1.6 m)    Weight:   Wt Readings from Last 1 Encounters:  07/30/17 138 lb 3.7 oz (62.7 kg)    Ideal Body Weight:  52.2 kg  BMI:  Body mass index is 24.49 kg/m.  Estimated Nutritional Needs:   Kcal:  1591  Protein:  80-95 grams  Fluid:  > 1.5 L/day  EDUCATION NEEDS:   No education needs identified at this time  Kendell Bane RD, LDN, CNSC [phone] Pager [phone] After Hours Pager

## 2017-07-31 NOTE — Progress Notes (Signed)
Patient ID: Suzanne Brooks, female   DOB: August 04, 1976, 41 y.o.   MRN: [id number] I was called to see the patient for altered mental status and decreased blood pressure. The patient was following commands through part of the night. The nurses then reported that she stopped "doing anything" and her systolic blood pressures went into the 90s.  She opened her eyes spontaneously for me, there is blinking movement, she localizes briskly on the left, she is purposeful with her left arm, she moves her left leg, there is mild flexion of the right upper extremity to painful stimuli, right pupil is 3 mm and briskly reactive, left pupil is 5 mm and sluggish, there is a very slight down and out deviation to the left eye  I have spoken to the family at length about the situation. She seems to be doing a little better at this point than the report I got. I am going to hold off on placing a ventriculostomy drain at this point. I will repeat her scan in a couple of hours to see if the ventricular system is continuing to enlarge, but she does not present clinically at this point like a person developing hydrocephalus. She is too purposeful, and I would not suspect that her blood pressure would drop with increasing intracranial pressure. 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  I do think there is a reasonable chance that she will end up needing a ventriculostomy drain. I explained this to the family at length. I suspect Dr. Conchita Paris will be in in a few hours to check on her, and I will update him of this status.

## 2017-07-31 NOTE — Progress Notes (Addendum)
Pt seen and examined. Was noted to be intermittently following commands overnight. CT scan done at 1a and 8a.   EXAM: Temp:  [97.8 F (36.6 C)-99.1 F (37.3 C)] 99.1 F (37.3 C) (09/14 1200) Pulse Rate:  [78-148] 103 (09/14 1500) Resp:  [14-34] 21 (09/14 1500) BP: (91-208)/(58-146) 121/82 (09/14 1500) SpO2:  [100 %] 100 % (09/14 1500) Arterial Line BP: (101-167)/(49-95) 139/74 (09/14 1500) FiO2 (%):  [30 %-100 %] 30 % (09/14 1451) Weight:  [60 kg (132 lb 4.4 oz)-62.7 kg (138 lb 3.7 oz)] 62.7 kg (138 lb 3.7 oz) (09/13 2034) Intake/Output      09/13 0701 - 09/14 0700 09/14 0701 - 09/15 0700   I.V. (mL/kg) 3987.4 (63.6) 954.8 (15.2)   IV Piggyback 510    Total Intake(mL/kg) 4497.4 (71.7) 954.8 (15.2)   Urine (mL/kg/hr) 3675 1025 (1.8)   Blood 10    Total Output 3685 1025   Net +812.4 -70.2         Opens eyes to voice Moves BUE/BLE spontaneously Able to show thumb on right  LABS: Lab Results  Component Value Date   CREATININE 0.79 07/30/2017   BUN 14 07/30/2017   NA 135 07/30/2017   K 2.6 (LL) 07/30/2017   CL 104 07/30/2017   CO2 17 (L) 07/30/2017   Lab Results  Component Value Date   WBC 13.1 (H) 07/30/2017   HGB 8.6 (L) 07/30/2017   HCT 28.2 (L) 07/30/2017   MCV 87.3 07/30/2017   PLT 287 07/30/2017    IMAGING: CT scans overnight reviewed, demonstrating no new hemorrhage, progressive ventriculomegaly with most recent scan likely showing some bifrontal transependymal flow.  IMPRESSION: - 41 y.o. 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 With history of SZ and fluctuating exam, certainly concern for ongoing SZ.  PLAN: - Will place right frontal EVD - Will get continuous EEG after EVD placed - Cont Nimotop, TCD monitoring

## 2017-07-31 NOTE — Progress Notes (Signed)
eLink Physician-Brief Progress Note Patient Name: Suzanne Brooks DOB: [birth year] MRN: [id number]   Date of Service  07/31/2017  HPI/Events of Note  Patient's family has arrived at the hospital. Updated the patient's mother by phone regarding events of today, recent head CT and the potential need for ventric drain depending on NSGY plans.  She has questions regarding prognosis, possible neuro deficits. I was able to answer these questions in general, but asked them to also address this with NSGY to get more specific answers about her particular deficits and anticipated recovery.   eICU Interventions       Intervention Category Major Interventions: Other:  Suzanne Brooks S. 07/31/2017, 3:08 AM

## 2017-07-31 NOTE — Progress Notes (Signed)
Patient ID: Suzanne Brooks, female   DOB: 03-07-1976, 41 y.o.   MRN: [id number] CT head reviewed. I agree with report. Has temporal horns now but lateral vents not really enlarged, 3rd not overly rounded. No need for ventric yet, but may be developing hydro. Plan for repeat CT later today.

## 2017-07-31 NOTE — Progress Notes (Signed)
RT transported pt to and from CT without event. RT will continue to monitor. 

## 2017-07-31 NOTE — Progress Notes (Signed)
Patient's sedation was turned completely off at 2230 to obtain an accurate NIH and neuro assessment. Patient did not move anything to pain. But with repeated stimulation would show a thumbs up on left hand. Neurosurgery made aware and STAT CT head ordered.

## 2017-07-31 NOTE — Progress Notes (Signed)
LTM EEG initiated.  Nursing staff educated regarding use of event button.

## 2017-07-31 NOTE — Progress Notes (Signed)
Patients blood pressure became soft, cleviprex titrated down and off around 0337. Patient dips down below SBP goal of 110-150. Neurosurgery and CCM made aware. All sedation turned off as well.  0430- patient not following commands anymore. Neurosurgery notified again of lower BP and neuro status. No new orders, will continue to monitor.

## 2017-08-01 ENCOUNTER — Inpatient Hospital Stay (HOSPITAL_COMMUNITY): Payer: Medicaid Other

## 2017-08-01 ENCOUNTER — Encounter (HOSPITAL_COMMUNITY): Payer: Self-pay | Admitting: Certified Registered Nurse Anesthetist

## 2017-08-01 DIAGNOSIS — I611 Nontraumatic intracerebral hemorrhage in hemisphere, cortical: Secondary | ICD-10-CM

## 2017-08-01 DIAGNOSIS — I609 Nontraumatic subarachnoid hemorrhage, unspecified: Secondary | ICD-10-CM

## 2017-08-01 DIAGNOSIS — R569 Unspecified convulsions: Secondary | ICD-10-CM

## 2017-08-01 LAB — GLUCOSE, CAPILLARY
Glucose-Capillary: 127 mg/dL — ABNORMAL HIGH (ref 65–99)
Glucose-Capillary: 118 mg/dL — ABNORMAL HIGH (ref 65–99)
Glucose-Capillary: 132 mg/dL — ABNORMAL HIGH (ref 65–99)
Glucose-Capillary: 143 mg/dL — ABNORMAL HIGH (ref 65–99)
Glucose-Capillary: 113 mg/dL — ABNORMAL HIGH (ref 65–99)

## 2017-08-01 LAB — PHOSPHORUS: PHOSPHORUS: 2.4 mg/dL — AB (ref 2.5–4.6)

## 2017-08-01 LAB — BASIC METABOLIC PANEL
ANION GAP: 7 (ref 5–15)
BUN: 5 mg/dL — ABNORMAL LOW (ref 6–20)
CO2: 18 mmol/L — ABNORMAL LOW (ref 22–32)
CREATININE: 0.81 mg/dL (ref 0.44–1.00)
Calcium: 7.6 mg/dL — ABNORMAL LOW (ref 8.9–10.3)
Chloride: 111 mmol/L (ref 101–111)
GFR calc non Af Amer: >60 mL/min (ref >60)
Glucose, Bld: 139 mg/dL — ABNORMAL HIGH (ref 65–99)
POTASSIUM: 3.6 mmol/L (ref 3.5–5.1)
SODIUM: 136 mmol/L (ref 135–145)

## 2017-08-01 LAB — CBC WITH DIFFERENTIAL/PLATELET
Basophils Absolute: 0 10*3/uL (ref 0.0–0.1)
Basophils Relative: 0 %
EOS PCT: 0 %
Eosinophils Absolute: 0 10*3/uL (ref 0.0–0.7)
HCT: 25.6 % — ABNORMAL LOW (ref 36.0–46.0)
Hemoglobin: 8.5 g/dL — ABNORMAL LOW (ref 12.0–15.0)
LYMPHS ABS: 0.8 10*3/uL (ref 0.7–4.0)
LYMPHS PCT: 3 %
MCH: 28.6 pg (ref 26.0–34.0)
MCHC: 33.2 g/dL (ref 30.0–36.0)
MCV: 86.2 fL (ref 78.0–100.0)
MONO ABS: 1.8 10*3/uL — AB (ref 0.1–1.0)
Monocytes Relative: 8 %
Neutro Abs: 20.1 10*3/uL — ABNORMAL HIGH (ref 1.7–7.7)
Neutrophils Relative %: 89 %
PLATELETS: 207 10*3/uL (ref 150–400)
RBC: 2.97 MIL/uL — ABNORMAL LOW (ref 3.87–5.11)
RDW: 18.3 % — AB (ref 11.5–15.5)
WBC: 22.7 10*3/uL — ABNORMAL HIGH (ref 4.0–10.5)

## 2017-08-01 LAB — MAGNESIUM: Magnesium: 3 mg/dL — ABNORMAL HIGH (ref 1.7–2.4)

## 2017-08-01 MED ORDER — DOCUSATE SODIUM 50 MG/5ML PO LIQD
100.0000 mg | Freq: Two times a day (BID) | ORAL | Status: DC
  Administered 2017-08-01 – 2017-08-04 (×5): 100 mg
  Filled 2017-08-01 (×6): qty 10

## 2017-08-01 MED ORDER — CHLORHEXIDINE GLUCONATE 0.12 % MT SOLN
15.0000 mL | Freq: Two times a day (BID) | OROMUCOSAL | Status: DC
  Administered 2017-08-01 – 2017-08-04 (×7): 15 mL via OROMUCOSAL
  Filled 2017-08-01 (×4): qty 15

## 2017-08-01 MED ORDER — ORAL CARE MOUTH RINSE
15.0000 mL | Freq: Two times a day (BID) | OROMUCOSAL | Status: DC
  Administered 2017-08-01 – 2017-08-04 (×8): 15 mL via OROMUCOSAL

## 2017-08-01 MED ORDER — DIAZEPAM 5 MG/ML IJ SOLN
2.0000 mg | Freq: Once | INTRAMUSCULAR | Status: AC
  Administered 2017-08-01: 2 mg via INTRAVENOUS
  Filled 2017-08-01: qty 2

## 2017-08-01 MED ORDER — FENTANYL CITRATE (PF) 100 MCG/2ML IJ SOLN
12.5000 ug | INTRAMUSCULAR | Status: DC | PRN
  Administered 2017-08-01 – 2017-08-13 (×14): 25 ug via INTRAVENOUS
  Filled 2017-08-01 (×14): qty 2

## 2017-08-01 NOTE — Progress Notes (Signed)
SLP Cancellation Note  Patient Details Name: Suzanne Brooks MRN: [id number] DOB: 11-Mar-1976   Cancelled treatment:       Reason Eval/Treat Not Completed: Patient not medically ready. Remains intubated. Will f/u.   Ferdinand Lango MA, CCC-SLP [phone]    Ellionna Buckbee [name] 08/01/2017, 8:01 AM

## 2017-08-01 NOTE — Evaluation (Signed)
Physical Therapy Evaluation Patient Details Name: Suzanne Brooks MRN: [id number] DOB: Aug 19, 1976 Today's Date: 08/01/2017   History of Present Illness  Pt is a 41 y/o female who presents with seizure activity. 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  Clinical Impression  Pt admitted with above diagnosis. Pt currently with functional limitations due to the deficits listed below (see PT Problem List). At the time of PT eval pt was able to perform transfers to/from EOB with +2 total assist. Pt very lethargic but opening eyes spontaneously after she was sat up EOB. Pt followed one command, and gave a "thumbs up" with her L hand. Recommending continued rehab at the CIR level to maximize functional independence and return to PLOF. Acutely, pt will benefit from skilled PT to increase their independence and safety with mobility to allow discharge to the venue listed below.       Follow Up Recommendations CIR;Supervision/Assistance - 24 hour    Equipment Recommendations  Other (comment) (TBD by next venue of care)    Recommendations for Other Services Rehab consult     Precautions / Restrictions Precautions Precautions: Fall Precaution Comments: EVD Restrictions Weight Bearing Restrictions: No      Mobility  Bed Mobility Overal bed mobility: Needs Assistance Bed Mobility: Supine to Sit;Sit to Supine     Supine to sit: Total assist;+2 for physical assistance;HOB elevated Sit to supine: Total assist;+2 for physical assistance   General bed mobility comments: Total assist for all aspects of bed mobility. No initiation noted.   Transfers                 General transfer comment: Unable  Ambulation/Gait             General Gait Details: Unable  Stairs            Wheelchair Mobility    Modified Rankin (Stroke Patients Only)       Balance Overall balance assessment: Needs  assistance Sitting-balance support: Feet unsupported;Bilateral upper extremity supported Sitting balance-Leahy Scale: Zero Sitting balance - Comments: Max assist for all sitting balance                                     Pertinent Vitals/Pain Pain Assessment: Faces Faces Pain Scale: No hurt    Home Living Family/patient expects to be discharged to:: Inpatient rehab                 Additional Comments: Pt unable to provide history, and no family present in room at the time of PT eval. Unsure what home environment is.    Prior Function Level of Independence: Independent         Comments: Presumably      Hand Dominance        Extremity/Trunk Assessment   Upper Extremity Assessment Upper Extremity Assessment: Defer to OT evaluation    Lower Extremity Assessment Lower Extremity Assessment: Difficult to assess due to impaired cognition    Cervical / Trunk Assessment Cervical / Trunk Assessment: Other exceptions Cervical / Trunk Exceptions: Forward head/rounded shoulder posture  Communication   Communication: Expressive difficulties  Cognition Arousal/Alertness: Lethargic Behavior During Therapy: Flat affect Overall Cognitive Status: Impaired/Different from baseline Area of Impairment: Orientation;Attention;Memory;Following commands;Safety/judgement;Awareness;Problem solving                 Orientation Level:  (  Non-verbal ) Current Attention Level: Focused Memory: Decreased recall of precautions;Decreased short-term memory Following Commands: Follows one step commands inconsistently Safety/Judgement: Decreased awareness of safety;Decreased awareness of deficits Awareness: Intellectual Problem Solving: Slow processing;Decreased initiation;Difficulty sequencing;Requires verbal cues;Requires tactile cues General Comments: Pt showed a "thumbs up" once but did not follow commands throughout the rest of session.       General Comments       Exercises     Assessment/Plan    PT Assessment Patient needs continued PT services  PT Problem List Decreased strength;Decreased range of motion;Decreased activity tolerance;Decreased balance;Decreased mobility;Decreased cognition;Decreased safety awareness;Decreased knowledge of use of DME;Decreased knowledge of precautions       PT Treatment Interventions DME instruction;Gait training;Stair training;Functional mobility training;Therapeutic activities;Therapeutic exercise;Neuromuscular re-education;Patient/family education;Cognitive remediation    PT Goals (Current goals can be found in the Care Plan section)  Acute Rehab PT Goals PT Goal Formulation: Patient unable to participate in goal setting Time For Goal Achievement: 08/15/17 Potential to Achieve Goals: Fair    Frequency Min 3X/week   Barriers to discharge        Co-evaluation PT/OT/SLP Co-Evaluation/Treatment: Yes Reason for Co-Treatment: Complexity of the patient's impairments (multi-system involvement);Necessary to address cognition/behavior during functional activity;To address functional/ADL transfers;For patient/therapist safety PT goals addressed during session: Mobility/safety with mobility;Balance         AM-PAC PT "6 Clicks" Daily Activity  Outcome Measure Difficulty turning over in bed (including adjusting bedclothes, sheets and blankets)?: Unable Difficulty moving from lying on back to sitting on the side of the bed? : Unable Difficulty sitting down on and standing up from a chair with arms (e.g., wheelchair, bedside commode, etc,.)?: Unable Help needed moving to and from a bed to chair (including a wheelchair)?: Total Help needed walking in hospital room?: Total Help needed climbing 3-5 steps with a railing? : Total 6 Click Score: 6    End of Session Equipment Utilized During Treatment: Oxygen Activity Tolerance: Patient limited by lethargy Patient left: in bed;with call bell/phone within reach;with  restraints reapplied Nurse Communication: Mobility status PT Visit Diagnosis: Muscle weakness (generalized) (M62.81);Other symptoms and signs involving the nervous system (R29.898)    Time: 5621-3086 PT Time Calculation (min) (ACUTE ONLY): 21 min   Charges:   PT Evaluation $PT Eval Moderate Complexity: 1 Mod     PT G Codes:        Conni Slipper, PT, DPT Acute Rehabilitation Services Pager: [phone]   Marylynn Pearson 08/01/2017, 3:17 PM

## 2017-08-01 NOTE — Progress Notes (Signed)
Inpatient Rehabilitation  Per PT, OT, and SLP request patient was screened by Fae Pippin for appropriateness for an Inpatient Acute Rehab consult.  At this time we are recommending an Inpatient Rehab consult.  Please order if you are agreeable.    Charlane Ferretti., CCC/SLP Admission Coordinator  [facility] Inpatient Rehabilitation  Cell [phone]

## 2017-08-01 NOTE — Evaluation (Signed)
Occupational Therapy Evaluation Patient Details Name: Suzanne Brooks MRN: [id number] DOB: 10/31/1976 Today's Date: 08/01/2017    History of Present Illness Pt is a 41 y/o female who presents with seizure activity. 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   Clinical Impression   Unsure of pts PLOF. Currently pt requires total assist +2 for bed mobility and and ADL. Pt presenting with impaired cognition, limited AROM bil UEs, ?impaired vision, poor sitting balance impacting her independence and safety with ADL and functional mobility. Recommending CIR level therapies to maximize independence and safety with ADL and functional mobility prior to return home. Pt would benefit from continued skilled OT to address established goals.    Follow Up Recommendations  CIR;Supervision/Assistance - 24 hour    Equipment Recommendations  Other (comment) (TBD at next venue)    Recommendations for Other Services       Precautions / Restrictions Precautions Precautions: Fall Precaution Comments: EVD Restrictions Weight Bearing Restrictions: No      Mobility Bed Mobility Overal bed mobility: Needs Assistance Bed Mobility: Supine to Sit;Sit to Supine     Supine to sit: Total assist;+2 for physical assistance;HOB elevated Sit to supine: Total assist;+2 for physical assistance   General bed mobility comments: Total assist for all aspects of bed mobility. No initiation noted.   Transfers                 General transfer comment: Unable    Balance Overall balance assessment: Needs assistance Sitting-balance support: Feet unsupported;Bilateral upper extremity supported Sitting balance-Leahy Scale: Zero Sitting balance - Comments: Max assist for all sitting balance                                   ADL either performed or assessed with clinical judgement   ADL Overall ADL's : Needs  assistance/impaired Eating/Feeding: NPO                                     General ADL Comments: Total assist for ADL at this time.     Vision   Additional Comments: L eye closed throughout most of session. Difficult to assess due to impaired cognition.     Perception     Praxis      Pertinent Vitals/Pain Pain Assessment: Faces Faces Pain Scale: No hurt     Hand Dominance     Extremity/Trunk Assessment Upper Extremity Assessment Upper Extremity Assessment: Difficult to assess due to impaired cognition (limited RUE active movement noted)   Lower Extremity Assessment Lower Extremity Assessment: Defer to PT evaluation   Cervical / Trunk Assessment Cervical / Trunk Assessment: Other exceptions Cervical / Trunk Exceptions: Forward head/rounded shoulder posture   Communication Communication Communication: Expressive difficulties   Cognition Arousal/Alertness: Lethargic Behavior During Therapy: Flat affect Overall Cognitive Status: Impaired/Different from baseline Area of Impairment: Orientation;Attention;Memory;Following commands;Safety/judgement;Awareness;Problem solving                 Orientation Level:  (Non-verbal ) Current Attention Level: Focused Memory: Decreased recall of precautions;Decreased short-term memory Following Commands: Follows one step commands inconsistently Safety/Judgement: Decreased awareness of safety;Decreased awareness of deficits Awareness: Intellectual Problem Solving: Slow processing;Decreased initiation;Difficulty sequencing;Requires verbal cues;Requires tactile cues General Comments: Pt showed a "thumbs up" once but did not follow commands  throughout the rest of session.    General Comments  HR in 120s throughout    Exercises     Shoulder Instructions      Home Living Family/patient expects to be discharged to:: Inpatient rehab                                 Additional Comments: Pt unable to  provide history, and no family present in room at the time of PT eval. Unsure what home environment is.      Prior Functioning/Environment Level of Independence: Independent        Comments: Presumably         OT Problem List: Decreased strength;Decreased activity tolerance;Decreased range of motion;Impaired balance (sitting and/or standing);Impaired vision/perception;Decreased coordination;Decreased cognition;Decreased safety awareness;Decreased knowledge of use of DME or AE;Cardiopulmonary status limiting activity;Impaired sensation;Impaired tone;Impaired UE functional use;Increased edema      OT Treatment/Interventions: Self-care/ADL training;Therapeutic exercise;Neuromuscular education;Energy conservation;DME and/or AE instruction;Therapeutic activities;Cognitive remediation/compensation;Visual/perceptual remediation/compensation;Patient/family education;Balance training    OT Goals(Current goals can be found in the care plan section) Acute Rehab OT Goals Patient Stated Goal: none stated OT Goal Formulation: With patient Time For Goal Achievement: 08/15/17 Potential to Achieve Goals: Good ADL Goals Pt Will Perform Grooming: with max assist;sitting Additional ADL Goal #1: Pt will sit EOB x5 mintues with min assist as precursor to ADL. Additional ADL Goal #2: Pt will follow one step command 25% of the time in a non distracting environment.  OT Frequency: Min 3X/week   Barriers to D/C:            Co-evaluation PT/OT/SLP Co-Evaluation/Treatment: Yes Reason for Co-Treatment: Complexity of the patient's impairments (multi-system involvement);Necessary to address cognition/behavior during functional activity;For patient/therapist safety;To address functional/ADL transfers PT goals addressed during session: Mobility/safety with mobility;Balance OT goals addressed during session: ADL's and self-care      AM-PAC PT "6 Clicks" Daily Activity     Outcome Measure Help from another  person eating meals?: Total Help from another person taking care of personal grooming?: Total Help from another person toileting, which includes using toliet, bedpan, or urinal?: Total Help from another person bathing (including washing, rinsing, drying)?: Total Help from another person to put on and taking off regular upper body clothing?: Total Help from another person to put on and taking off regular lower body clothing?: Total 6 Click Score: 6   End of Session Equipment Utilized During Treatment: Oxygen Nurse Communication: Mobility status  Activity Tolerance: Patient tolerated treatment well Patient left: in bed;with call bell/phone within reach;with SCD's reapplied;with restraints reapplied  OT Visit Diagnosis: Muscle weakness (generalized) (M62.81);Other abnormalities of gait and mobility (R26.89)                Time: 1914-7829 OT Time Calculation (min): 21 min Charges:  OT General Charges $OT Visit: 1 Visit OT Evaluation $OT Eval Moderate Complexity: 1 Mod G-Codes:     Caylei Sperry A. Brett Albino, M.S., OTR/L Pager: [phone]  Gaye Alken 08/01/2017, 3:41 PM

## 2017-08-01 NOTE — Evaluation (Signed)
  Clinical/Bedside Swallow Evaluation Patient Details  Name: Suzanne Brooks: [id number] Date of Birth: 03/07/1976  Today's Date: 08/01/2017 Time: SLP Start Time (ACUTE ONLY): 1550 SLP Stop Time (ACUTE ONLY): 1553 SLP Time Calculation (min) (ACUTE ONLY): 3 min  Past Medical History:  Past Medical History:  Diagnosis Date  . Hypertension   . Seizures (HCC)    Past Surgical History:  Past Surgical History:  Procedure Laterality Date  . RADIOLOGY WITH ANESTHESIA N/A 07/30/2017   Procedure: RADIOLOGY WITH ANESTHESIA/ DR. Gerlene Burdock;  Surgeon: Radiologist, Medication, MD;  Location: [facility] OR;  Service: Radiology;  Laterality: N/A;   HPI:  Pt is a 41 y/o female who presents with seizure activity. 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 ETT 9/13-9/15.    Assessment / Plan / Recommendation Clinical Impression  Pt presents with moderate-severe risk for aspiration in the setting of SAH s/p coiling, 3 day intubation and reduced level of alertness. Pt is initially lethargic, becomes more alert with oral care and responds verbally to 50% of simple biographical questions. Voice is reduced in intensity and hoarse, intermittently aphonic. She demonstrates consistent, immediate throat clearing with ice chips, thin liquids, pureed solids, suggestive of reduced airway protection. Per RN cortrak placement likely this evening. Will follow up for PO trials to assess readiness for instrumental exam (MBS). SLP Visit Diagnosis: Dysphagia, unspecified (R13.10)    Aspiration Risk  Moderate aspiration risk;Severe aspiration risk    Diet Recommendation NPO;Ice chips PRN after oral care   Medication Administration: Via alternative means    Other  Recommendations Oral Care Recommendations: Oral care QID;Oral care prior to ice chip/H20 Other Recommendations: Remove water pitcher;Have oral suction available   Follow up Recommendations  Inpatient Rehab      Frequency and Duration min 2x/week  2 weeks       Prognosis Prognosis for Safe Diet Advancement: Good Barriers to Reach Goals: Cognitive deficits      Swallow Study   General Date of Onset: 07/30/17 HPI: Pt is a 41 y/o female who presents with seizure activity. 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 ETT 9/13-9/15.  Type of Study: Bedside Swallow Evaluation Previous Swallow Assessment: none in chart Diet Prior to this Study: NPO Temperature Spikes Noted: Yes (100.2) Respiratory Status: Nasal cannula History of Recent Intubation: Yes Length of Intubations (days): 3 days Date extubated: 08/01/17 Behavior/Cognition: Lethargic/Drowsy;Distractible;Requires cueing Oral Cavity Assessment: Within Functional Limits Oral Care Completed by SLP: Recent completion by staff Oral Cavity - Dentition: Adequate natural dentition Self-Feeding Abilities: Total assist Patient Positioning: Upright in bed Baseline Vocal Quality: Hoarse;Low vocal intensity Volitional Cough: Cognitively unable to elicit Volitional Swallow: Unable to elicit    Oral/Motor/Sensory Function Overall Oral Motor/Sensory Function: Generalized oral weakness   Ice Chips Ice chips: Impaired Presentation: Spoon Pharyngeal Phase Impairments: Throat Clearing - Delayed   Thin Liquid Thin Liquid: Impaired Presentation: Cup;Spoon;Straw Pharyngeal  Phase Impairments: Multiple swallows;Suspected delayed Swallow;Throat Clearing - Immediate    Nectar Thick Nectar Thick Liquid: Not tested   Honey Thick Honey Thick Liquid: Not tested   Puree Puree: Impaired Presentation: Spoon Pharyngeal Phase Impairments: Throat Clearing - Immediate   Solid   GO   Rondel Baton, MS, CCC-SLP Speech-Language Pathologist [phone] Solid: Not tested        Arlana Lindau 08/01/2017,4:13 PM

## 2017-08-01 NOTE — Procedures (Signed)
Continuous Video-EEG Monitoring Report  Patient: Suzanne Brooks, Suzanne Brooks     EEG No.ID: [id number]     DOB: [birth year] Age: 41 Room#4N24  MED REC NO: [id number] Gender: Female   TECH: Liliana Cline     Physician: Ronnie Doss     Referring Physician: Lisbeth Renshaw     Report Date: 08/01/2017       Study Duration: 07/31/2017 17:20 to 08/01/2017 09:15 CPT Code:  29528 Diagnosis:  Seizures (R56.9)  History: This is a 41 year old female presenting with seizures in the setting of acute intracranial hemorrhage.  Video-EEG monitoring was performed to evaluate for seizures.  Technical Details:  Long-term video-EEG monitoring was performed using standard setting per the guidelines.  Briefly, a minimum of 21 electrodes were placed on scalp according to the International 10-20 or/and 10-10 Systems.  Supplemental electrodes were placed as needed.  Single EKG electrode was also used to detect cardiac arrhythmia.  Patient's behavior was continuously recorded on video simultaneously with EEG.  A minimum of 16 channels were used for data display.  Each epoch of study was reviewed manually daily and as needed using standard referential and bipolar montages.    EEG Description:  There was diffuse beta activity, which was most likely due to medication effect (propofol).  Generalized polymorphic delta slowing was present.  No posterior dominant rhythm or sleep architecture was recorded.  No focal, lateralized or periodic abnormality was seen.  No epileptiform discharges or seizures were in evidence.    Impression:  This is an abnormal EEG due to the presence of generalized polymorphic delta slowing, suggesting severe encephalopathy, but medication effect (propofol) could not be excluded.  No epileptiform discharge or seizure was in evidence.   Reading Physician: Ronnie Doss, MD, PhD

## 2017-08-01 NOTE — Procedures (Signed)
Extubation Procedure Note  Patient Details:   Name: Suzanne Brooks DOB: Dec 09, 1975 MRN: [id number]   Airway Documentation:     Evaluation  O2 sats: stable throughout Complications: No apparent complications Patient did tolerate procedure well. Bilateral Breath Sounds: Rhonchi   Yes pt able vocalize.   Pt extubated at this time per MD order. No complications noted. Pt able to breathe around deflated cuff. Pt has strong, adequate cough. IS attempted, weak effort with performed. Pt placed on 4L [place]. No stridor noted at this time.   Loyal Jacobson [facility] 08/01/2017, 9:06 AM

## 2017-08-01 NOTE — Progress Notes (Signed)
PULMONARY / CRITICAL CARE MEDICINE   Name: Suzanne Brooks MRN: [id number] DOB: 26-Sep-1976    ADMISSION DATE:  07/30/2017 CONSULTATION DATE:  07/30/2017  REFERRING MD:  Conchita Paris  CHIEF COMPLAINT:  SAH  BRIEF HISTORY OF PRESENT ILLNESS:   41 y/o female with a history of hypertension and seizures presented on 9/13 with seizure, found to have a subarachnoid hemorrhage and intraparenchymal hemorrhage in left frontal lobe. She underwent left opthlmic aneurysm coiling, had an EVD placed on 9/14.   SUBJECTIVE:  No acute issues  VITAL SIGNS: BP 139/69   Pulse (!) 110   Temp 98.9 F (37.2 C) (Axillary)   Resp (!) 30   Ht  (1.6 m)   Wt 150 lb 12.7 oz (68.4 kg)   LMP  (LMP Unknown) Comment: neg preg test  SpO2 96%   BMI 26.71 kg/m   HEMODYNAMICS:    VENTILATOR SETTINGS: Vent Mode: PRVC FiO2 (%):  [30 %] 30 % Set Rate:  [14 bmp] 14 bmp Vt Set:  [420 mL] 420 mL PEEP:  [5 cmH20] 5 cmH20 Plateau Pressure:  [9 cmH20-18 cmH20] 18 cmH20  INTAKE / OUTPUT: I/O last 3 completed shifts: In: 7339.3 [I.V.:6161.6; NG/GT:147.7; IV Piggyback:1030] Out: 6576 [Urine:6350; Drains:216; Blood:10]  PHYSICAL EXAMINATION:  General:  In bed on vent HENT: NCAT ETT in place PULM: CTA B, vent supported breathing CV: RRR, no mgr GI: BS+, soft, nontender MSK: normal bulk and tone Neuro: sedated on vent    LABS:  BMET  Recent Labs Lab 07/30/17 1515 07/31/17 1600 08/01/17 0004  NA 135 135 136  K 2.6* 2.9* 3.6  CL 104 107 111  CO2 17* 20* 18*  BUN 14 <5* 5*  CREATININE 0.79 0.74 0.81  GLUCOSE 121* 128* 139*    Electrolytes  Recent Labs Lab 07/30/17 1515 07/31/17 1600 08/01/17 0004 08/01/17 0344  CALCIUM 8.6* 7.5* 7.6*  --   MG  --  1.4*  --  3.0*  PHOS  --  2.8  --  2.4*    CBC  Recent Labs Lab 07/30/17 1515 08/01/17 0344  WBC 13.1* 22.7*  HGB 8.6* 8.5*  HCT 28.2* 25.6*  PLT 287 207    Coag's  Recent Labs Lab 07/30/17 2039  APTT 37*  INR 1.07     Sepsis Markers No results for input(s): LATICACIDVEN, PROCALCITON, O2SATVEN in the last 168 hours.  ABG  Recent Labs Lab 07/30/17 1807 07/30/17 2245  PHART 7.355 7.391  PCO2ART 47.8 30.5*  PO2ART 397.0* 211*    Liver Enzymes No results for input(s): AST, ALT, ALKPHOS, BILITOT, ALBUMIN in the last 168 hours.  Cardiac Enzymes No results for input(s): TROPONINI, PROBNP in the last 168 hours.  Glucose  Recent Labs Lab 07/30/17 1509 07/31/17 1646 07/31/17 1945 08/01/17 0002 08/01/17 0343 08/01/17 0809  GLUCAP 134* 143* 137* 138* 127* 118*    Imaging Dg Chest Port 1 View  Result Date: 07/31/2017 CLINICAL DATA:  Endotracheal tube placement.  New onset seizures. EXAM: PORTABLE CHEST 1 VIEW COMPARISON:  07/30/2017 FINDINGS: Endotracheal tube has been pulled back and has tip 4.5 cm above the carina. Enteric tube courses into the stomach and off the inferior portion of the film as tip is not visualized. Lungs are adequately inflated with hazy opacification over the right upper lobe which may be a developing pneumonia. No evidence of effusion or pneumothorax. Cardiomediastinal silhouette and remainder of the exam is unchanged. IMPRESSION: Hazy density over the right upper lobe which may be due to  atelectasis or developing pneumonia. Tubes and lines as described. Electronically Signed   By: Elberta Fortis M.D.   On: 07/31/2017 11:33     STUDIES:  CT head 9/13 > Extensive subarachnoid hemorrhage throughout the basilar cisterns, sylvian fissure and interhemispheric fissure question ruptured aneurysm. Additional intra cerebral hematoma within the inferior LEFT frontal lobe 3.5 x 1.4 x 1.8 cm, un common to have intraparenchymal extension of subarachnoid hemorrhage, question concomitant or subsequent fall with traumatic hemorrhage? CT head 9/14> stable left frontal intraparenchymal hemorrhage and moderate SAH, embolization material stable, EVD in  place  CULTURES:   ANTIBIOTICS:   SIGNIFICANT EVENTS: 9/13 admit  LINES/TUBES: Ett 9/13 > 9/14 ventricular drain >   DISCUSSION: 41 y/o female with a past medical history of hypertension and seizure here with a left frontoparenchymal hemorrage and SAH.    ASSESSMENT / PLAN:  NEUROLOGIC A:   Seizure history, unknown if on anti-epileptics SAH Left frontal intraparenchymal hemorrhage S/p L opth artery coiling P:   RASS goal: 0 to -1 PAD protocol > propofol Continue keppra Continue nimodipine Monitor drain output  PULMONARY A: Acute respiratory failure with hypoxemia secondary to inabilty to protect airway 9/14 CXR severely rotated, poor study P:   Full mechanical vent support VAP prevention Daily WUA/SBT Repeat CXR Possible extubation today if mental status OK  CARDIOVASCULAR A:  Hypertension P:  SBP goal 110-150 per neurology Follow cuff pressures as a-line inaccurate Continue cleviprex  RENAL A:   No acute issues P:   Monitor BMET and UOP Replace electrolytes as needed   GASTROINTESTINAL A:   No acute issues P:   Hold tube feedings for possible extubation  HEMATOLOGIC A:   Anemia wihtout bleeding Leukocytosis> stress response? P:  Monitor for bleeding  INFECTIOUS A:   No acute issues P:   Monitor for fever  ENDOCRINE A:   No acute issues   P:   Monitor glucose  My cc time 40 minutes   FAMILY  - Updates: none bedside  - Inter-disciplinary family meet or Palliative Care meeting due by:  day 7  Heber [place], MD [place] [facility] Pager: [phone] Cell: [phone] After 3pm or if no response, call [phone]   08/01/2017, 8:14 AM

## 2017-08-01 NOTE — Progress Notes (Signed)
Cortrak Tube Team Note:  RD had received consult for tube feeding. She has been extubated. Upon clarification w/ RN, this was for a small bore NGT if the patient failed her swallow evaluation.   No current note indicating whether or not patient failed/passed this evaluation.   Given time of day and closure of service, pt would require ngt placed on bedside by nurse if this is required. Alternatively, If small bore is required, pt would need to go to IR. If patient can wait, cortrak service will be available Monday.   Will discontinue RD consult.   Thank you.   Christophe Louis RD, LDN, CNSC Clinical Nutrition Pager: [phone] 08/01/2017 4:11 PM

## 2017-08-01 NOTE — Progress Notes (Signed)
No new events or problems overnight.  Afebrile. Vital signs stable. Fluid balance positive. Ventricular drainage approximately 10/h. Fluid blood-tinged. Patient well open eyes to voice and appears aware. No effort at verbalization. Will stick out tongue to command. Occasionally follows commands with right hand.  Status post subarachnoid/intracerebral hemorrhage secondary to ruptured cerebral aneurysm. Continue ICU observation and care.

## 2017-08-02 ENCOUNTER — Inpatient Hospital Stay (HOSPITAL_COMMUNITY): Payer: Medicaid Other

## 2017-08-02 DIAGNOSIS — J189 Pneumonia, unspecified organism: Secondary | ICD-10-CM

## 2017-08-02 DIAGNOSIS — A419 Sepsis, unspecified organism: Secondary | ICD-10-CM

## 2017-08-02 LAB — CBC WITH DIFFERENTIAL/PLATELET
BASOS ABS: 0 10*3/uL (ref 0.0–0.1)
BASOS PCT: 0 %
Eosinophils Absolute: 0 10*3/uL (ref 0.0–0.7)
Eosinophils Relative: 0 %
HCT: 30.1 % — ABNORMAL LOW (ref 36.0–46.0)
HEMOGLOBIN: 9.5 g/dL — AB (ref 12.0–15.0)
LYMPHS PCT: 3 %
Lymphs Abs: 0.9 10*3/uL (ref 0.7–4.0)
MCH: 27.2 pg (ref 26.0–34.0)
MCHC: 31.6 g/dL (ref 30.0–36.0)
MCV: 86.2 fL (ref 78.0–100.0)
MONOS PCT: 5 %
Monocytes Absolute: 1.6 10*3/uL — ABNORMAL HIGH (ref 0.1–1.0)
NEUTROS ABS: 28.9 10*3/uL — AB (ref 1.7–7.7)
Neutrophils Relative %: 92 %
Platelets: 239 10*3/uL (ref 150–400)
RBC: 3.49 MIL/uL — ABNORMAL LOW (ref 3.87–5.11)
RDW: 18.1 % — ABNORMAL HIGH (ref 11.5–15.5)
WBC: 31.4 10*3/uL — ABNORMAL HIGH (ref 4.0–10.5)

## 2017-08-02 LAB — URINALYSIS, ROUTINE W REFLEX MICROSCOPIC
Bilirubin Urine: NEGATIVE
GLUCOSE, UA: 50 mg/dL — AB
KETONES UR: NEGATIVE mg/dL
Leukocytes, UA: NEGATIVE
NITRITE: NEGATIVE
PH: 5 (ref 5.0–8.0)
PROTEIN: 30 mg/dL — AB
Specific Gravity, Urine: 1.006 (ref 1.005–1.030)

## 2017-08-02 LAB — BASIC METABOLIC PANEL
ANION GAP: 9 (ref 5–15)
BUN: 7 mg/dL (ref 6–20)
CHLORIDE: 107 mmol/L (ref 101–111)
CO2: 22 mmol/L (ref 22–32)
Calcium: 8.6 mg/dL — ABNORMAL LOW (ref 8.9–10.3)
Creatinine, Ser: 0.68 mg/dL (ref 0.44–1.00)
GFR calc Af Amer: >60 mL/min (ref >60)
Glucose, Bld: 124 mg/dL — ABNORMAL HIGH (ref 65–99)
Potassium: 3.9 mmol/L (ref 3.5–5.1)
SODIUM: 138 mmol/L (ref 135–145)

## 2017-08-02 LAB — GLUCOSE, CAPILLARY
GLUCOSE-CAPILLARY: 127 mg/dL — AB (ref 65–99)
GLUCOSE-CAPILLARY: 121 mg/dL — AB (ref 65–99)
GLUCOSE-CAPILLARY: 126 mg/dL — AB (ref 65–99)
Glucose-Capillary: 119 mg/dL — ABNORMAL HIGH (ref 65–99)
Glucose-Capillary: 127 mg/dL — ABNORMAL HIGH (ref 65–99)
Glucose-Capillary: 134 mg/dL — ABNORMAL HIGH (ref 65–99)
Glucose-Capillary: 141 mg/dL — ABNORMAL HIGH (ref 65–99)

## 2017-08-02 LAB — MAGNESIUM
MAGNESIUM: 2.5 mg/dL — AB (ref 1.7–2.4)
Magnesium: 2.2 mg/dL (ref 1.7–2.4)
Magnesium: 2.3 mg/dL (ref 1.7–2.4)

## 2017-08-02 LAB — PHOSPHORUS
Phosphorus: 1.7 mg/dL — ABNORMAL LOW (ref 2.5–4.6)
Phosphorus: 2.1 mg/dL — ABNORMAL LOW (ref 2.5–4.6)
Phosphorus: 1.3 mg/dL — ABNORMAL LOW (ref 2.5–4.6)

## 2017-08-02 LAB — LACTIC ACID, PLASMA
LACTIC ACID, VENOUS: 2.6 mmol/L — AB (ref 0.5–1.9)
Lactic Acid, Venous: 2.2 mmol/L (ref 0.5–1.9)

## 2017-08-02 LAB — TRIGLYCERIDES: TRIGLYCERIDES: 103 mg/dL (ref <150)

## 2017-08-02 MED ORDER — VANCOMYCIN HCL IN DEXTROSE 750-5 MG/150ML-% IV SOLN
750.0000 mg | Freq: Two times a day (BID) | INTRAVENOUS | Status: DC
  Administered 2017-08-02 – 2017-08-05 (×7): 750 mg via INTRAVENOUS
  Filled 2017-08-02 (×8): qty 150

## 2017-08-02 MED ORDER — DEXTROSE 5 % IV SOLN
2.0000 g | Freq: Three times a day (TID) | INTRAVENOUS | Status: DC
  Administered 2017-08-02 – 2017-08-11 (×28): 2 g via INTRAVENOUS
  Filled 2017-08-02 (×29): qty 2

## 2017-08-02 MED ORDER — SODIUM CHLORIDE 0.9 % IV BOLUS (SEPSIS)
1000.0000 mL | Freq: Once | INTRAVENOUS | Status: AC
  Administered 2017-08-02: 1000 mL via INTRAVENOUS

## 2017-08-02 MED ORDER — SODIUM CHLORIDE 0.9 % IV BOLUS (SEPSIS)
250.0000 mL | Freq: Once | INTRAVENOUS | Status: AC
  Administered 2017-08-02: 250 mL via INTRAVENOUS

## 2017-08-02 MED ORDER — PRO-STAT SUGAR FREE PO LIQD
30.0000 mL | Freq: Two times a day (BID) | ORAL | Status: DC
  Administered 2017-08-02 – 2017-08-03 (×3): 30 mL
  Filled 2017-08-02 (×3): qty 30

## 2017-08-02 MED ORDER — SODIUM CHLORIDE 0.9 % IV BOLUS (SEPSIS): 1000.0000 mL | Freq: Once | INTRAVENOUS | Status: DC

## 2017-08-02 MED ORDER — VITAL HIGH PROTEIN PO LIQD
1000.0000 mL | ORAL | Status: DC
  Administered 2017-08-02 (×7)
  Administered 2017-08-02: 1000 mL
  Administered 2017-08-02 (×2)
  Administered 2017-08-03: 1000 mL

## 2017-08-02 MED ORDER — VANCOMYCIN HCL IN DEXTROSE 1-5 GM/200ML-% IV SOLN
1000.0000 mg | Freq: Once | INTRAVENOUS | Status: AC
  Administered 2017-08-02: 1000 mg via INTRAVENOUS
  Filled 2017-08-02: qty 200

## 2017-08-02 NOTE — Progress Notes (Signed)
No new issues or problems overnight. Patient nonverbal.  Low-grade temperature elevations. Vital signs otherwise stable. Oxygen well. Urine output appropriate. Fluid balance remains overall positive.  Patient is awake and aware. She follows commands bilaterally. She still has a dense nonfluent aphasia. CSF output approximately 10 mL/h. Fluid remains blood-tinged.  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 Continue current management.

## 2017-08-02 NOTE — Progress Notes (Signed)
SLP Cancellation Note  Patient Details Name: Suzanne Brooks MRN: [id number] DOB: 01-27-1976   Cancelled treatment:       Reason Eval/Treat Not Completed: Fatigue/lethargy limiting ability to participate. Pt now with NG. Will follow up next date for readiness for MBS, cognitive-linguistic evaluation.  Rondel Baton, [place], CCC-SLP Speech-Language Pathologist [phone]  Arlana Lindau 08/02/2017, 9:38 AM

## 2017-08-02 NOTE — Progress Notes (Signed)
RT note-no Arterial line, charted in error.

## 2017-08-02 NOTE — Progress Notes (Signed)
CRITICAL VALUE ALERT  Critical Value:  Lactic acid 2.6  Date & Time Notied:  08/02/17 at 1225  Provider Notified: CCM MD  Orders Received/Actions taken: No new orders received at this time.

## 2017-08-02 NOTE — Progress Notes (Signed)
Pharmacy Antibiotic Note  Suzanne Brooks is a 41 y.o. female admitted on 07/30/2017 with sepsis and fever of unknown origin.  Pharmacy has been consulted for vancomycin/ceftazidime dosing. WBC today is elevated at 31.4 and patient is febrile with Tmax 101.1. Patient has not been on any antibiotics up to this point since admission. Renal function is stable with SCr- 0.68 and estimated CrCl ~ 80 ml/min.  Plan: Vancomycin 1000 mg Load then 750 mg every 12 hours Ceftazidime 2 gm every 8 hours  Monitor renal function, cultures, and clinical s/sx of improvement Vancomycin trough at steady state as indicated   Height:  (160 cm) Weight: 150 lb 12.7 oz (68.4 kg) IBW/kg (Calculated) : 52.4  Temp (24hrs), Avg:99.8 F (37.7 C), Min:98.5 F (36.9 C), Max:101.1 F (38.4 C)   Recent Labs Lab 07/30/17 1515 07/31/17 1600 08/01/17 0004 08/01/17 0344 08/02/17 0502  WBC 13.1*  --   --  22.7* 31.4*  CREATININE 0.79 0.74 0.81  --  0.68    Estimated Creatinine Clearance: 84.2 mL/min (by C-G formula based on SCr of 0.68 mg/dL).    No Known Allergies  Antimicrobials this admission: 9/16 Vanc >> 9/16 Ceftaz >>   Microbiology results: 9/13  MRSA PCR: Negative    Thank you for allowing pharmacy to be a part of this patient's care.  Della Goo, PharmD PGY2 Infectious Diseases Pharmacy Resident Phone: [phone] 08/02/2017 8:56 AM

## 2017-08-02 NOTE — Progress Notes (Signed)
PULMONARY / CRITICAL CARE MEDICINE   Name: Suzanne Brooks MRN: [id number] DOB: 1976/10/31    ADMISSION DATE:  07/30/2017 CONSULTATION DATE:  07/30/2017  REFERRING MD:  Conchita Paris  CHIEF COMPLAINT:  SAH  BRIEF HISTORY OF PRESENT ILLNESS:   41 y/o female with a history of hypertension and seizures presented on 9/13 with seizure, found to have a subarachnoid hemorrhage and intraparenchymal hemorrhage in left frontal lobe. She underwent left opthlmic aneurysm coiling, had an EVD placed on 9/14.   SUBJECTIVE:  Extubated yesterday Pulled out NG Has fever CXR without infiltrate  VITAL SIGNS: BP (!) 131/95   Pulse (!) 117   Temp 100 F (37.8 C) (Axillary)   Resp 19   Ht  (1.6 m)   Wt 68.4 kg (150 lb 12.7 oz)   LMP  (LMP Unknown) Comment: neg preg test  SpO2 96%   BMI 26.71 kg/m   HEMODYNAMICS:    VENTILATOR SETTINGS:    INTAKE / OUTPUT: I/O last 3 completed shifts: In: 5222.7 [I.V.:4344.4; NG/GT:138.3; IV Piggyback:740] Out: 5214 [Urine:4700; Drains:514]  PHYSICAL EXAMINATION:  General:  Resting comfortably in bed HENT: Ventricular drain clean, dry, no drainage OP clear PULM: CTA B, normal effort CV: RRR, no mgr GI: BS+, soft, nontender MSK: normal bulk and tone Neuro: awakens to voice, opens R eye, moves R hand, non verbal     LABS:  BMET  Recent Labs Lab 07/31/17 1600 08/01/17 0004 08/02/17 0502  NA 135 136 138  K 2.9* 3.6 3.9  CL 107 111 107  CO2 20* 18* 22  BUN <5* 5* 7  CREATININE 0.74 0.81 0.68  GLUCOSE 128* 139* 124*    Electrolytes  Recent Labs Lab 07/31/17 1600 08/01/17 0004 08/01/17 0344 08/02/17 0502  CALCIUM 7.5* 7.6*  --  8.6*  MG 1.4*  --  3.0* 2.5*  PHOS 2.8  --  2.4* 1.7*    CBC  Recent Labs Lab 07/30/17 1515 08/01/17 0344 08/02/17 0502  WBC 13.1* 22.7* 31.4*  HGB 8.6* 8.5* 9.5*  HCT 28.2* 25.6* 30.1*  PLT 287 207 239    Coag's  Recent Labs Lab 07/30/17 2039  APTT 37*  INR 1.07    Sepsis  Markers No results for input(s): LATICACIDVEN, PROCALCITON, O2SATVEN in the last 168 hours.  ABG  Recent Labs Lab 07/30/17 1807 07/30/17 2245  PHART 7.355 7.391  PCO2ART 47.8 30.5*  PO2ART 397.0* 211*    Liver Enzymes No results for input(s): AST, ALT, ALKPHOS, BILITOT, ALBUMIN in the last 168 hours.  Cardiac Enzymes No results for input(s): TROPONINI, PROBNP in the last 168 hours.  Glucose  Recent Labs Lab 08/01/17 1136 08/01/17 1538 08/01/17 2018 08/02/17 0019 08/02/17 0407 08/02/17 0723  GLUCAP 132* 143* 113* 119* 141* 127*    Imaging Dg Abd 1 View  Result Date: 08/02/2017 CLINICAL DATA:  Nasogastric placement EXAM: ABDOMEN - 1 VIEW COMPARISON:  08/02/2017 FINDINGS: Nasogastric tube enters the stomach with its tip in the fundus. Moderate amount of gas throughout the colon. Small bowel pattern unremarkable. IMPRESSION: Nasogastric tube enters the stomach with its tip in the fundus. Electronically Signed   By: Paulina Fusi M.D.   On: 08/02/2017 07:58   Dg Chest Port 1 View  Result Date: 08/02/2017 CLINICAL DATA:  Acute respiratory failure with hypoxia. EXAM: PORTABLE CHEST 1 VIEW COMPARISON:  08/01/2017 FINDINGS: No visible endotracheal tube. Nasogastric tube enters the gastric fundus than doubles back into the esophagus with its tip at the midthoracic esophagus. Infiltrates less  volume loss persists in the right lower lobe. IMPRESSION: Persistent right lower lobe pneumonia. Nasogastric tube enters the gastric fundus but doubles back upon itself with its tip in the midthoracic esophagus. Electronically Signed   By: Paulina Fusi M.D.   On: 08/02/2017 06:51   Dg Chest Port 1 View  Result Date: 08/01/2017 CLINICAL DATA:  Acute respiratory failure. EXAM: PORTABLE CHEST 1 VIEW COMPARISON:  Earlier this morning at 0620 hours. FINDINGS: 0858 hours. Removal of nasogastric and endotracheal tubes. Extensive artifact over the left-sided chest. Patient rotated to the right.  Cardiomegaly accentuated by AP portable technique. Left costophrenic angle minimally excluded. No pleural effusion or pneumothorax. Mild right hemidiaphragm elevation with improved right-sided aeration. No congestive failure. IMPRESSION: Extubation and removal of nasogastric tube. Cardiomegaly without congestive failure. Improved right base aeration with decreased atelectasis. Electronically Signed   By: Jeronimo Greaves M.D.   On: 08/01/2017 09:14   Dg Abd Portable 1v  Result Date: 08/02/2017 CLINICAL DATA:  Encounter for feeding tube placement. EXAM: PORTABLE ABDOMEN - 1 VIEW COMPARISON:  Chest radiograph performed immediately prior. FINDINGS: The enteric tube previously coiled in the stomach has been retracted since chest radiograph, not visualized on the current exam. Nonobstructive bowel gas pattern. IMPRESSION: Enteric tube not visualized in the abdomen. Electronically Signed   By: Rubye Oaks M.D.   On: 08/02/2017 05:12   Dg Abd Portable 1v  Result Date: 08/01/2017 CLINICAL DATA:  Evaluate NG tube placement EXAM: PORTABLE ABDOMEN - 1 VIEW COMPARISON:  None FINDINGS: Nasogastric tube is identified looped within the left upper quadrant of the abdomen. The side port of the tube is approximately 3.7 cm distal to the expected location of the GE junction. IMPRESSION: Side port of the NG tube is below the level of the GE junction. Electronically Signed   By: Signa Kell M.D.   On: 08/01/2017 19:11     STUDIES:  CT head 9/13 > Extensive subarachnoid hemorrhage throughout the basilar cisterns, sylvian fissure and interhemispheric fissure question ruptured aneurysm. Additional intra cerebral hematoma within the inferior LEFT frontal lobe 3.5 x 1.4 x 1.8 cm, un common to have intraparenchymal extension of subarachnoid hemorrhage, question concomitant or subsequent fall with traumatic hemorrhage? CT head 9/14> stable left frontal intraparenchymal hemorrhage and moderate SAH, embolization material  stable, EVD in place  CULTURES: 9/16 blood >  9/16 urine >   ANTIBIOTICS: 9/16 vanc >   9/16 ceftaz >   SIGNIFICANT EVENTS: 9/13 admit  LINES/TUBES: Ett 9/13 > 9/15 9/14 ventricular drain >   DISCUSSION: 41 y/o female with a past medical history of hypertension and seizure here with a left frontoparenchymal hemorrage and SAH.  Fever 9/16.  ASSESSMENT / PLAN:  NEUROLOGIC A:   Seizure history, unknown if on anti-epileptics SAH Left frontal intraparenchymal hemorrhage S/p L opth artery coiling P:   Per neurosurgery Continue keppra Continue nimodipine Monitor drain output  PULMONARY A: Acute respiratory failure with hypoxemia secondary to inabilty to protect airway > resolved 9/15 CXR severely rotated, poor study > RLL pneumonia? No clear infiltrate on my review P:   Incentive spirometry Start HCAP coverage  CARDIOVASCULAR A:  Hypertension Sinus tach from sepsis, not in shock P:  SBP goal 110-150 per neurology Follow cuff pressures as a-line inaccurate Continue cleviprex Sepsis protocol > 30cc/kg IBW Follow lactic acid  RENAL A:   No acute issues P:   Monitor BMET and UOP Replace electrolytes as needed    GASTROINTESTINAL A:   No acute issues  dysphagia P:   Restart tube feeding  HEMATOLOGIC A:   Anemia wihtout bleeding Leukocytosis> stress response? P:  Monitor for bleeding  INFECTIOUS A:   Sepsis, HCAP? P:   Sepsis protocol Fluids, vanc, ceftaz, cultures  ENDOCRINE A:   No acute issues   P:   Monitor glucose    FAMILY  - Updates: none bedside  - Inter-disciplinary family meet or Palliative Care meeting due by:  day 7  Heber [place], MD [place] [facility] Pager: [phone] Cell: [phone] After 3pm or if no response, call [phone]   08/02/2017, 8:55 AM

## 2017-08-02 NOTE — Progress Notes (Signed)
CRITICAL VALUE ALERT  Critical Value:  Lactic acid 2.2   Date & Time Notied:  08/02/17 at 1150  Provider Notified: CCM MD notified and responded at 1245 pm (08/02/17)  Orders Received/Actions taken: continue with sepsis protocol (repeat lactic acid)

## 2017-08-02 NOTE — Progress Notes (Signed)
Pt. Pulled NG tube out. RN attempted times 2 to place another NG unsuccessfully. RN will continue to monitor.

## 2017-08-03 ENCOUNTER — Inpatient Hospital Stay (HOSPITAL_BASED_OUTPATIENT_CLINIC_OR_DEPARTMENT_OTHER): Payer: Medicaid Other

## 2017-08-03 ENCOUNTER — Inpatient Hospital Stay (HOSPITAL_COMMUNITY): Payer: Medicaid Other

## 2017-08-03 DIAGNOSIS — I609 Nontraumatic subarachnoid hemorrhage, unspecified: Secondary | ICD-10-CM | POA: Diagnosis not present

## 2017-08-03 LAB — GLUCOSE, CAPILLARY
GLUCOSE-CAPILLARY: 132 mg/dL — AB (ref 65–99)
Glucose-Capillary: 145 mg/dL — ABNORMAL HIGH (ref 65–99)
Glucose-Capillary: 122 mg/dL — ABNORMAL HIGH (ref 65–99)
Glucose-Capillary: 137 mg/dL — ABNORMAL HIGH (ref 65–99)
Glucose-Capillary: 129 mg/dL — ABNORMAL HIGH (ref 65–99)

## 2017-08-03 LAB — CBC WITH DIFFERENTIAL/PLATELET
BASOS PCT: 0 %
Basophils Absolute: 0 10*3/uL (ref 0.0–0.1)
EOS PCT: 0 %
Eosinophils Absolute: 0 10*3/uL (ref 0.0–0.7)
HEMATOCRIT: 27.6 % — AB (ref 36.0–46.0)
HEMOGLOBIN: 9 g/dL — AB (ref 12.0–15.0)
Lymphocytes Relative: 3 %
Lymphs Abs: 1.2 10*3/uL (ref 0.7–4.0)
MCH: 27.9 pg (ref 26.0–34.0)
MCHC: 32.6 g/dL (ref 30.0–36.0)
MCV: 85.4 fL (ref 78.0–100.0)
MONO ABS: 2 10*3/uL — AB (ref 0.1–1.0)
Monocytes Relative: 5 %
NEUTROS PCT: 92 %
Neutro Abs: 36.5 10*3/uL — ABNORMAL HIGH (ref 1.7–7.7)
Platelets: 220 10*3/uL (ref 150–400)
RBC: 3.23 MIL/uL — ABNORMAL LOW (ref 3.87–5.11)
RDW: 18.4 % — AB (ref 11.5–15.5)
WBC: 39.7 10*3/uL — ABNORMAL HIGH (ref 4.0–10.5)

## 2017-08-03 LAB — BASIC METABOLIC PANEL
ANION GAP: 14 (ref 5–15)
BUN: 10 mg/dL (ref 6–20)
CALCIUM: 8.5 mg/dL — AB (ref 8.9–10.3)
CHLORIDE: 107 mmol/L (ref 101–111)
CO2: 18 mmol/L — ABNORMAL LOW (ref 22–32)
Creatinine, Ser: 0.78 mg/dL (ref 0.44–1.00)
GFR calc Af Amer: >60 mL/min (ref >60)
GFR calc non Af Amer: >60 mL/min (ref >60)
GLUCOSE: 138 mg/dL — AB (ref 65–99)
Potassium: 2.7 mmol/L — CL (ref 3.5–5.1)
Sodium: 139 mmol/L (ref 135–145)

## 2017-08-03 LAB — MAGNESIUM
Magnesium: 2.1 mg/dL (ref 1.7–2.4)
Magnesium: 2 mg/dL (ref 1.7–2.4)

## 2017-08-03 LAB — PROCALCITONIN: PROCALCITONIN: 0.44 ng/mL

## 2017-08-03 LAB — URINE CULTURE

## 2017-08-03 LAB — PHOSPHORUS
Phosphorus: 1.8 mg/dL — ABNORMAL LOW (ref 2.5–4.6)
Phosphorus: 2.9 mg/dL (ref 2.5–4.6)

## 2017-08-03 MED ORDER — POTASSIUM PHOSPHATES 15 MMOLE/5ML IV SOLN
30.0000 mmol | Freq: Once | INTRAVENOUS | Status: AC
  Administered 2017-08-03: 30 mmol via INTRAVENOUS
  Filled 2017-08-03: qty 10

## 2017-08-03 MED ORDER — OSMOLITE 1.5 CAL PO LIQD
1000.0000 mL | ORAL | Status: DC
  Administered 2017-08-03 – 2017-08-18 (×14): 1000 mL
  Filled 2017-08-03 (×24): qty 1000

## 2017-08-03 MED ORDER — PRO-STAT SUGAR FREE PO LIQD
30.0000 mL | Freq: Every day | ORAL | Status: DC
  Administered 2017-08-04 – 2017-08-20 (×16): 30 mL
  Filled 2017-08-03 (×16): qty 30

## 2017-08-03 MED ORDER — HYDRALAZINE HCL 20 MG/ML IJ SOLN
10.0000 mg | INTRAMUSCULAR | Status: DC | PRN
  Administered 2017-08-04: 20 mg via INTRAVENOUS
  Administered 2017-08-11: 40 mg via INTRAVENOUS
  Administered 2017-08-12 – 2017-08-22 (×2): 20 mg via INTRAVENOUS
  Filled 2017-08-03 (×3): qty 1
  Filled 2017-08-03: qty 2

## 2017-08-03 NOTE — Progress Notes (Signed)
Physical Therapy Treatment Patient Details Name: Suzanne Brooks MRN: [id number] DOB: [birth year] Today's Date: 08/03/2017    History of Present Illness Pt is a 41 y/o female who presents with seizure activity. 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 Pt disoriented to place, time and situation but following one step commands consistently. Pt with movement bil UE and performing reaching activities EOB with OT as well as active ROM LLE, no AROM or activation noted on LLE throughout session. Pt attempting to correct and right balance throughout EoB with progression of decreased assist needed when pelvis aligned in neutral and verbal cues given. Will continue to follow to maximize function and balance.    Follow Up Recommendations  CIR;Supervision/Assistance - 24 hour     Equipment Recommendations       Recommendations for Other Services       Precautions / Restrictions Precautions Precautions: Fall Precaution Comments: EVD Restrictions Weight Bearing Restrictions: No    Mobility  Bed Mobility Overal bed mobility: Needs Assistance Bed Mobility: Supine to Sit     Supine to sit: Max assist;+2 for physical assistance Sit to supine: Max assist;+2 for physical assistance   General bed mobility comments: pt with max assist to move bil LE off of bed, elevate trunk and pivot to EOB. pt initiating movement of LLE with cues but unable to clear bed  Transfers Overall transfer level: Needs assistance   Transfers: Sit to/from Stand Sit to Stand: Mod assist;+2 physical assistance         General transfer comment: mod +2 with Right knee blocked. Pt able to stand on LLE with buckling on right and assist for weight shift and balance, assist to rise and control descent. Scooting to EOB and sideways at EOB max +2 assist with pt  initiating effort  Ambulation/Gait             General Gait Details: Unable   Stairs            Wheelchair Mobility    Modified Rankin (Stroke Patients Only) Modified Rankin (Stroke Patients Only) Pre-Morbid Rankin Score: No symptoms Modified Rankin: Severe disability     Balance Overall balance assessment: Needs assistance Sitting-balance support: Feet supported Sitting balance-Leahy Scale: Poor Sitting balance - Comments: pt EOB 18 min with variation of minguard- mod assist for balance. Pt able to maintain minguard grossly 3 min at a time with verbal cues for awareness of body position with bil UE supported and unsupported                                    Cognition Arousal/Alertness: Awake/alert Behavior During Therapy: Flat affect Overall Cognitive Status: Impaired/Different from baseline Area of Impairment: Orientation;Attention;Memory;Following commands;Safety/judgement;Awareness;Problem solving                 Orientation Level: Disoriented to;Time;Place;Situation Current Attention Level: Focused Memory: Decreased short-term memory Following Commands: Follows one step commands consistently Safety/Judgement: Decreased awareness of deficits   Problem Solving: Slow processing;Requires verbal cues;Requires tactile cues General Comments: pt stating it is February and we are at a friends house beginning of session. End of session stated hospital but continued to state Feb      Exercises General Exercises - Lower Extremity Long Arc Quad: AROM;Left;Seated;10  reps Hip Flexion/Marching: AROM;Left;Seated;5 reps    General Comments        Pertinent Vitals/Pain Pain Assessment: No/denies pain    Home Living                      Prior Function            PT Goals (current goals can now be found in the care plan section) Progress towards PT goals: Progressing toward goals    Frequency    Min 3X/week      PT Plan  Current plan remains appropriate    Co-evaluation PT/OT/SLP Co-Evaluation/Treatment: Yes Reason for Co-Treatment: Complexity of the patient's impairments (multi-system involvement);For patient/therapist safety PT goals addressed during session: Mobility/safety with mobility;Balance;Strengthening/ROM        AM-PAC PT "6 Clicks" Daily Activity  Outcome Measure  Difficulty turning over in bed (including adjusting bedclothes, sheets and blankets)?: Unable Difficulty moving from lying on back to sitting on the side of the bed? : Unable Difficulty sitting down on and standing up from a chair with arms (e.g., wheelchair, bedside commode, etc,.)?: Unable Help needed moving to and from a bed to chair (including a wheelchair)?: Total Help needed walking in hospital room?: Total Help needed climbing 3-5 steps with a railing? : Total 6 Click Score: 6    End of Session   Activity Tolerance: Patient tolerated treatment well Patient left: in bed;with call bell/phone within reach;with restraints reapplied Nurse Communication: Mobility status;Precautions PT Visit Diagnosis: Muscle weakness (generalized) (M62.81);Other symptoms and signs involving the nervous system (R29.898);Other abnormalities of gait and mobility (R26.89)     Time: 1610-9604 PT Time Calculation (min) (ACUTE ONLY): 32 min  Charges:  $Therapeutic Activity: 8-22 mins                    G Codes:       Delaney Meigs, PT [phone]    Tahiry Spicer B Haniah Penny 08/03/2017, 11:56 AM

## 2017-08-03 NOTE — Progress Notes (Signed)
IVC bag changed by sterile technique

## 2017-08-03 NOTE — Progress Notes (Addendum)
  Speech Language Pathology Treatment: Dysphagia;Cognitive-Linquistic  Patient Details Name: Suzanne Brooks MRN: [id number] DOB: 1976-11-17 Today's Date: 08/03/2017 Time: 1478-2956 SLP Time Calculation (min) (ACUTE ONLY): 14 min  Assessment / Plan / Recommendation Clinical Impression  Pt lethargic; awakens with verbal and tactile stimuli for brief periods. Oral hygiene provided before ice chips trials and 1/2 teaspoon water. Decreased laryngeal protection suspected indicated by immediate cough with tsp water. Vocal intensity low; needs encouragement to respond verbally to questions. Continue NPO with consistent oral care and will recommend objective evaluation when pt able to maintain alertness. Speech-language eval to be completed.    HPI HPI: Pt is a 41 y/o female who presents with seizure activity. 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 ETT 9/13-9/15.       SLP Plan  Continue with current plan of care       Recommendations  Diet recommendations: NPO Medication Administration: Via alternative means                Oral Care Recommendations: Oral care QID Follow up Recommendations: Inpatient Rehab Plan: Continue with current plan of care                       Royce Macadamia 08/03/2017, 3:27 PM  Breck Coons Lonell Face.Ed [employer] [phone]

## 2017-08-03 NOTE — Progress Notes (Signed)
Transcranial Doppler  Date POD PCO2 HCT BP  MCA ACA PCA OPHT SIPH VERT Basilar  9/14 vs     Right  Left   49  44   -14  -44   21  37   -30  -35     -37    9/17 je     Right  Left   79  143   -45  -67   12  32   31  19   109  42   -60  -37   -54           Right  Left                                             Right  Left                                             Right  Left                                            Right  Left                                            Right  Left                                        MCA = Middle Cerebral Artery      OPHT = Opthalmic Artery     BASILAR = Basilar Artery   ACA = Anterior Cerebral Artery     SIPH = Carotid Siphon PCA = Posterior Cerebral Artery   VERT = Verterbral Artery                   Normal MCA = 62+\-12 ACA = 50+\-12 PCA = 42+\-23   9/17 [place] ratio: right 4.4 left 4.6. je

## 2017-08-03 NOTE — Progress Notes (Signed)
Pt seen and examined. No issues overnight.   EXAM: Temp:  [98.6 F (37 C)-103.1 F (39.5 C)] 98.7 F (37.1 C) (09/17 1347) Pulse Rate:  [97-128] 109 (09/17 1400) Resp:  [17-30] 21 (09/17 1400) BP: (117-174)/(73-119) 159/100 (09/17 1400) SpO2:  [90 %-100 %] 98 % (09/17 1400) Weight:  [66.5 kg (146 lb 9.7 oz)] 66.5 kg (146 lb 9.7 oz) (09/17 0405) Intake/Output      09/16 0701 - 09/17 0700 09/17 0701 - 09/18 0700   I.V. (mL/kg) 2888.5 (43.4) 749.6 (11.3)   NG/GT 837.3 203.3   IV Piggyback 1676.6 620   Total Intake(mL/kg) 5402.4 (81.2) 1572.9 (23.7)   Urine (mL/kg/hr) 3200 (2)    Drains 357 81   Total Output 3557 81   Net +1845.4 +1491.9        Urine Occurrence 1 x    Stool Occurrence 1 x     Opens eyes with stimulation Pupils reactive Says name, does speak in short sentences Follows commands BUE, LLE. Minimal movement noted RLE EVD in place, functional  LABS: Lab Results  Component Value Date   CREATININE 0.78 08/03/2017   BUN 10 08/03/2017   NA 139 08/03/2017   K 2.7 (LL) 08/03/2017   CL 107 08/03/2017   CO2 18 (L) 08/03/2017   Lab Results  Component Value Date   WBC 39.7 (H) 08/03/2017   HGB 9.0 (L) 08/03/2017   HCT 27.6 (L) 08/03/2017   MCV 85.4 08/03/2017   PLT 220 08/03/2017    IMAGING: CTH reviewed demonstrating no HCP, no new hemorrhage. Some edema surrounding left frontotemporal IPH.  TCD reviewed, increase in LMCA velocity  IMPRESSION: - 41 y.o. female SAH d# 5 s/p coiling left ophthalmic aneurysm. 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 

## 2017-08-03 NOTE — Progress Notes (Signed)
PULMONARY / CRITICAL CARE MEDICINE   Name: Suzanne Brooks MRN: [id number] DOB: 01-30-1976    ADMISSION DATE:  07/30/2017 CONSULTATION DATE:  07/30/2017  REFERRING MD:  Conchita Paris  CHIEF COMPLAINT:  SAH  BRIEF HISTORY OF PRESENT ILLNESS:   41 y/o female with a history of hypertension and seizures presented on 9/13 with seizure, found to have a subarachnoid hemorrhage and intraparenchymal hemorrhage in left frontal lobe. She underwent left opthlmic aneurysm coiling, had an EVD placed on 9/14.   SUBJECTIVE:  Extubated yesterday Pulled out NG Has fever CXR without infiltrate  VITAL SIGNS: BP 127/84   Pulse (!) 117   Temp (!) 103.1 F (39.5 C) (Axillary)   Resp (!) 27   Ht  (1.6 m)   Wt 66.5 kg (146 lb 9.7 oz)   LMP  (LMP Unknown) Comment: neg preg test  SpO2 96%   BMI 25.97 kg/m   HEMODYNAMICS:    VENTILATOR SETTINGS:    INTAKE / OUTPUT: I/O last 3 completed shifts: In: 6898.7 [I.V.:4274.8; NG/GT:837.3; IV Piggyback:1786.6] Out: 5186 [Urine:4650; Drains:536]  PHYSICAL EXAMINATION:  General:  Resting comfortably in bed, NAD HENT: Ventricular drain clean, dry, no drainage OP clear PULM: Decreased BS at the bases CV: RRR, Nl S1/S2, -M/R/G. GI: Soft, NT, ND and +BS MSK: WNL Neuro: Awakens to voice, opens R eye, moves R hand, non-verbal  LABS:  BMET  Recent Labs Lab 08/01/17 0004 08/02/17 0502 08/03/17 0519  NA 136 138 139  K 3.6 3.9 2.7*  CL 111 107 107  CO2 18* 22 18*  BUN 5* 7 10  CREATININE 0.81 0.68 0.78  GLUCOSE 139* 124* 138*    Electrolytes  Recent Labs Lab 08/01/17 0004  08/02/17 0502 08/02/17 1016 08/02/17 1608 08/03/17 0519  CALCIUM 7.6*  --  8.6*  --   --  8.5*  MG  --   < > 2.5* 2.2 2.3 2.1  PHOS  --   < > 1.7* 2.1* 1.3* 1.8*  < > = values in this interval not displayed.  CBC  Recent Labs Lab 08/01/17 0344 08/02/17 0502 08/03/17 0519  WBC 22.7* 31.4* 39.7*  HGB 8.5* 9.5* 9.0*  HCT 25.6* 30.1* 27.6*  PLT 207 239  220   Coag's  Recent Labs Lab 07/30/17 2039  APTT 37*  INR 1.07   Sepsis Markers  Recent Labs Lab 08/02/17 1016 08/02/17 1222  LATICACIDVEN 2.2* 2.6*   ABG  Recent Labs Lab 07/30/17 1807 07/30/17 2245  PHART 7.355 7.391  PCO2ART 47.8 30.5*  PO2ART 397.0* 211*   Liver Enzymes No results for input(s): AST, ALT, ALKPHOS, BILITOT, ALBUMIN in the last 168 hours.  Cardiac Enzymes No results for input(s): TROPONINI, PROBNP in the last 168 hours.  Glucose  Recent Labs Lab 08/02/17 1249 08/02/17 1607 08/02/17 1949 08/02/17 2338 08/03/17 0400 08/03/17 0817  GLUCAP 127* 121* 126* 134* 132* 145*   Imaging No results found.   STUDIES:  CT head 9/13 > Extensive subarachnoid hemorrhage throughout the basilar cisterns, sylvian fissure and interhemispheric fissure question ruptured aneurysm. Additional intra cerebral hematoma within the inferior LEFT frontal lobe 3.5 x 1.4 x 1.8 cm, un common to have intraparenchymal extension of subarachnoid hemorrhage, question concomitant or subsequent fall with traumatic hemorrhage? CT head 9/14> stable left frontal intraparenchymal hemorrhage and moderate SAH, embolization material stable, EVD in place  CULTURES: 9/16 blood >  9/16 urine >   ANTIBIOTICS: 9/16 vanc >   9/16 ceftaz >   SIGNIFICANT EVENTS: 9/13 admit  LINES/TUBES: Ett 9/13 > 9/15 9/14 ventricular drain >   I reviewed CXR myself, RLL infiltrate noted  DISCUSSION: 41 y/o female with a past medical history of hypertension and seizure here with a left frontoparenchymal hemorrage and SAH.  Fever 9/16.  Discussed with PCCM-NP.  ASSESSMENT / PLAN:  NEUROLOGIC A:   Seizure history, unknown if on anti-epileptics SAH Left frontal intraparenchymal hemorrhage S/p L opth artery coiling P:   Per neurosurgery Continue keppra Continue nimodipine IVD per neurosurgery  PULMONARY A: Acute respiratory failure with hypoxemia secondary to inabilty to protect  airway > resolved 9/15 CXR severely rotated, poor study > RLL pneumonia? No clear infiltrate on my review P:   Incentive spirometry HCAP coverage Monitor closely for airway protection, concern is that with mental status patient may loose control of her airway and require intubation.  CARDIOVASCULAR A:  Hypertension Sinus tach from sepsis, not in shock P:  SBP goal 110-150 per neurology Follow cuff pressures as a-line inaccurate Continue cleviprex Sepsis protocol > 30cc/kg IBW  RENAL A:   No acute issues P:   Monitor BMET and UOP Replace electrolytes as needed K3PO4 orders placed  GASTROINTESTINAL A:   No acute issues dysphagia P:   Tube feeding May need PEG tube  HEMATOLOGIC A:   Anemia wihtout bleeding Leukocytosis> stress response? P:  Monitor for bleeding  INFECTIOUS A:   Sepsis, HCAP? P:   Sepsis protocol Fluids, vanc, ceftaz, cultures Procalcitonin protocol ordered  ENDOCRINE A:   No acute issues   P:   Monitor glucose  FAMILY  - Updates: Hold in the ICU given IVD, new fever, pulmonary infiltrate and concern for airway protection given mental status.  PCCM will continue to follow.  - Inter-disciplinary family meet or Palliative Care meeting due by:  day 7  The patient is critically ill with multiple organ systems failure and requires high complexity decision making for assessment and support, frequent evaluation and titration of therapies, application of advanced monitoring technologies and extensive interpretation of multiple databases.   Critical Care Time devoted to patient care services described in this note is  35  Minutes. This time reflects time of care of this signee Dr Koren Bound. This critical care time does not reflect procedure time, or teaching time or supervisory time of PA/NP/Med student/Med Resident etc but could involve care discussion time.  Alyson Reedy, M.D. [facility] Pulmonary/Critical Care Medicine. Pager:  [phone]. After hours pager: [phone].  08/03/2017, 10:05 AM

## 2017-08-03 NOTE — Progress Notes (Signed)
Occupational Therapy Treatment Patient Details Name: Suzanne Brooks MRN: [id number] DOB: [birth year] Today's Date: 08/03/2017    History of present illness Pt is a 41 y/o female who presents with seizure activity. 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 Purposeful active movement noted in 3/4 extremities and pt able to complete AROM (bil UEs and LLE) and trunk control exercises seated at EOB x18 minutes with mod-min guard assist. Pt required mod assist +2 for sit to stand at EOB and max assist for LB bathing/peri care. Updated OT goals to reflect pts progress. D/c plan remains appropriate. Will continue to follow acutely.   Follow Up Recommendations  CIR;Supervision/Assistance - 24 hour    Equipment Recommendations  Other (comment) (TBD at next venue)    Recommendations for Other Services      Precautions / Restrictions Precautions Precautions: Fall Precaution Comments: EVD Restrictions Weight Bearing Restrictions: No       Mobility Bed Mobility Overal bed mobility: Needs Assistance Bed Mobility: Supine to Sit     Supine to sit: Max assist;+2 for physical assistance Sit to supine: Max assist;+2 for physical assistance   General bed mobility comments: pt with max assist to move bil LE off of bed, elevate trunk and pivot to EOB. pt initiating movement of LLE with cues but unable to clear bed  Transfers Overall transfer level: Needs assistance Equipment used: 2 person hand held assist Transfers: Sit to/from Stand Sit to Stand: Mod assist;+2 physical assistance         General transfer comment: mod +2 with Right knee blocked. Pt able to stand on LLE with buckling on right and assist for weight shift and balance, assist to rise and control descent. Scooting to EOB and sideways at EOB  max +2 assist with pt initiating effort    Balance Overall balance assessment: Needs assistance Sitting-balance support: Feet supported Sitting balance-Leahy Scale: Poor Sitting balance - Comments: pt EOB 18 min with variation of minguard- mod assist for balance. Pt able to maintain minguard grossly 3 min at a time with verbal cues for awareness of body position with bil UE supported and unsupported   Standing balance support: Bilateral upper extremity supported Standing balance-Leahy Scale: Poor Standing balance comment: max assist +2 to maintain standing balance                           ADL either performed or assessed with clinical judgement   ADL Overall ADL's : Needs assistance/impaired             Lower Body Bathing: Maximal assistance;Sit to/from stand;+2 for physical assistance       Lower Body Dressing: Maximal assistance Lower Body Dressing Details (indicate cue type and reason): to don socks     Toileting- Clothing Manipulation and Hygiene: Maximal assistance;+2 for physical assistance;Sit to/from stand               Vision   Additional Comments: Pt reporting no visual deficits. L eye droop noted but open and tracking in all quads this session.   Perception     Praxis      Cognition Arousal/Alertness: Awake/alert Behavior During Therapy: Flat affect Overall Cognitive Status: Impaired/Different from baseline Area of Impairment: Orientation;Attention;Memory;Following commands;Safety/judgement;Awareness;Problem solving  Orientation Level: Disoriented to;Time;Place;Situation Current Attention Level: Focused Memory: Decreased short-term memory Following Commands: Follows one step commands consistently Safety/Judgement: Decreased awareness of deficits Awareness: Intellectual Problem Solving: Slow processing;Requires verbal cues;Requires tactile cues General Comments: pt stating it is February and we are at a friends  house beginning of session. End of session stated hospital but continued to state Feb        Exercises Exercises: General Upper Extremity;Other exercises General Exercises - Upper Extremity Shoulder Flexion: AROM;Both;10 reps;Seated Shoulder Extension: AROM;Both;10 reps;Seated  Other Exercises Other Exercises: Pt tolerated sitting EOB x18 minutes with mod-min guard assist. Pt able to perform trunk control activities and identify which way she was leaning and able to self correct ~50% of the time with verbal cues.   Shoulder Instructions       General Comments      Pertinent Vitals/ Pain       Pain Assessment: No/denies pain  Home Living                                          Prior Functioning/Environment              Frequency  Min 3X/week        Progress Toward Goals  OT Goals(current goals can now be found in the care plan section)  Progress towards OT goals: Progressing toward goals  Acute Rehab OT Goals Patient Stated Goal: none stated OT Goal Formulation: With patient ADL Goals Pt Will Perform Grooming: with min guard assist;sitting Pt Will Transfer to Toilet: with min assist;stand pivot transfer;bedside commode Pt Will Perform Toileting - Clothing Manipulation and hygiene: with min assist;sit to/from stand Additional ADL Goal #1: Pt will sit EOB x10 minutes with min guard assist as precursor to ADL.  Plan Discharge plan remains appropriate    Co-evaluation    PT/OT/SLP Co-Evaluation/Treatment: Yes Reason for Co-Treatment: Complexity of the patient's impairments (multi-system involvement);For patient/therapist safety;To address functional/ADL transfers PT goals addressed during session: Mobility/safety with mobility;Balance;Strengthening/ROM OT goals addressed during session: ADL's and self-care      AM-PAC PT "6 Clicks" Daily Activity     Outcome Measure   Help from another person eating meals?: Total Help from another person  taking care of personal grooming?: A Lot Help from another person toileting, which includes using toliet, bedpan, or urinal?: A Lot Help from another person bathing (including washing, rinsing, drying)?: A Lot Help from another person to put on and taking off regular upper body clothing?: A Lot Help from another person to put on and taking off regular lower body clothing?: A Lot 6 Click Score: 11    End of Session    OT Visit Diagnosis: Muscle weakness (generalized) (M62.81);Other abnormalities of gait and mobility (R26.89)   Activity Tolerance Patient tolerated treatment well   Patient Left in bed;with call bell/phone within reach;with SCD's reapplied   Nurse Communication Mobility status        Time: 1610-9604 OT Time Calculation (min): 35 min  Charges: OT General Charges $OT Visit: 1 Visit OT Treatments $Self Care/Home Management : 8-22 mins  Soul Hackman A. Brett Albino, M.S., OTR/L Pager: [phone]   Gaye Alken 08/03/2017, 2:16 PM

## 2017-08-03 NOTE — Progress Notes (Signed)
Nutrition Follow-up  DOCUMENTATION CODES:   Not applicable  INTERVENTION:  - Will order 30 mL Prostat once/day with Osmolite 1.5 @ 30 mL/hr to increase by 10 mL every 8 hours to goal rate of Osmolite 1.5 @ 50 mL/hr. Slow advancement given current hypokalemia. At goal rate, this regimen + kcal from current Cleviprex rate will provide 1996 kcal, 90 grams of protein, and 914 mL free water.   Monitor magnesium, potassium, and phosphorus daily for at least 3 days, MD to replete as needed, as pt is at risk for refeeding syndrome given current hypokalemia.   NUTRITION DIAGNOSIS:   Inadequate oral intake related to inability to eat as evidenced by NPO status. -ongoing  GOAL:   Patient will meet greater than or equal to 90% of their needs -unmet with current TF regimen  MONITOR:   TF tolerance, Weight trends, Labs  REASON FOR ASSESSMENT:   Consult Enteral/tube feeding initiation and management  ASSESSMENT:   41 year old female who presented to ED with seizures found to be hypertensive with aneurysmal SAH, ICH. Coiled in IR 9/13. Post-op to ICU on vent.   9/17 Pt was extubated on 9/15 shortly before 9:00 AM. SLP saw pt later that day and recommended NPO. Small bore NGT was placed yesterday AM and pt is currently receiving Vital High Protein @ 40 mL/hr and also with order for 30 mL Prostat BID. This regimen provides 1160 kcal, 114 grams of protein, and 802 mL free water. Will adjust TF regimen as outlined above to better meet re-estimated nutrition needs (updated based on extubation and weight 9/13 at 2034: 62.7 kg). Weight beginning to trend back down toward admission weight.   Per Dr. Percival Spanish note today at 10:05 AM: Monitor closely for airway protection, concern is that with mental status patient may loose control of her airway and require intubation. May need PEG tube.  Medications reviewed; 100 mg Colace BID per NGT, 15 mL liquid multivitamin/day per NGT, 40 mg Protonix/day per NGT,  30 mmol IV KPhos x1 run today.  Labs reviewed; CBGs: 132 and 145 mg/dL this AM, K: 2.7 mmol/L, Mg: 1.8 mg/dL, Ca: 8.5 mg/dL.  IVF: NS @ 100 mL/hr.  Drip: Cleviprex @ 2 mg/hr (96 kcal from fat)   9/14 Pt discussed during ICU rounds and with RN.   Patient is currently intubated on ventilator support MV: 12.5 L/min Temp (24hrs), Avg:98.4 F (36.9 C), Min:97.8 F (36.6 C), Max:99.1 F (37.3 C)  Propofol: 10.8 ml/hr provides: 285 kcal   Cleviprex: 30 ml/hr provides: 1440 kcal   Diet Order:   NPO  Skin:  Wound (see comment) (R groin incision from 9/13)  Last BM:  9/16  Height:   Ht Readings from Last 1 Encounters:  07/30/17  (1.6 m)    Weight:   Wt Readings from Last 1 Encounters:  08/03/17 146 lb 9.7 oz (66.5 kg)    Ideal Body Weight:  52.2 kg  BMI:  Body mass index is 25.97 kg/m.  Estimated Nutritional Needs:   Kcal:  1880-2070 (30-33 kcal/kg)  Protein:  82-94 grams (1.3-1.5 grams/kg)  Fluid:  > 1.5 L/day  EDUCATION NEEDS:   No education needs identified at this time    Trenton Gammon, MS, RD, LDN, CNSC Inpatient Clinical Dietitian Pager # [phone] After hours/weekend pager # [phone]

## 2017-08-04 ENCOUNTER — Encounter (HOSPITAL_COMMUNITY): Payer: Self-pay | Admitting: *Deleted

## 2017-08-04 ENCOUNTER — Inpatient Hospital Stay (HOSPITAL_COMMUNITY): Payer: Medicaid Other

## 2017-08-04 LAB — BASIC METABOLIC PANEL
ANION GAP: 12 (ref 5–15)
BUN: 6 mg/dL (ref 6–20)
CALCIUM: 8.9 mg/dL (ref 8.9–10.3)
CHLORIDE: 106 mmol/L (ref 101–111)
CO2: 22 mmol/L (ref 22–32)
Creatinine, Ser: 0.78 mg/dL (ref 0.44–1.00)
GFR calc non Af Amer: >60 mL/min (ref >60)
Glucose, Bld: 202 mg/dL — ABNORMAL HIGH (ref 65–99)
POTASSIUM: 2.1 mmol/L — AB (ref 3.5–5.1)
Sodium: 140 mmol/L (ref 135–145)

## 2017-08-04 LAB — CBC WITH DIFFERENTIAL/PLATELET
BASOS PCT: 0 %
Basophils Absolute: 0 10*3/uL (ref 0.0–0.1)
EOS ABS: 0.4 10*3/uL (ref 0.0–0.7)
EOS PCT: 1 %
HEMATOCRIT: 29.4 % — AB (ref 36.0–46.0)
Hemoglobin: 9.6 g/dL — ABNORMAL LOW (ref 12.0–15.0)
LYMPHS ABS: 1.1 10*3/uL (ref 0.7–4.0)
Lymphocytes Relative: 3 %
MCH: 27.7 pg (ref 26.0–34.0)
MCHC: 32.7 g/dL (ref 30.0–36.0)
MCV: 84.7 fL (ref 78.0–100.0)
MONO ABS: 2.2 10*3/uL — AB (ref 0.1–1.0)
Monocytes Relative: 6 %
NEUTROS ABS: 33.1 10*3/uL — AB (ref 1.7–7.7)
NEUTROS PCT: 90 %
Platelets: 246 10*3/uL (ref 150–400)
RBC: 3.47 MIL/uL — ABNORMAL LOW (ref 3.87–5.11)
RDW: 17.8 % — AB (ref 11.5–15.5)
WBC: 36.8 10*3/uL — ABNORMAL HIGH (ref 4.0–10.5)

## 2017-08-04 LAB — GLUCOSE, CAPILLARY
GLUCOSE-CAPILLARY: 104 mg/dL — AB (ref 65–99)
GLUCOSE-CAPILLARY: 130 mg/dL — AB (ref 65–99)
GLUCOSE-CAPILLARY: 137 mg/dL — AB (ref 65–99)
GLUCOSE-CAPILLARY: 165 mg/dL — AB (ref 65–99)
GLUCOSE-CAPILLARY: 177 mg/dL — AB (ref 65–99)
Glucose-Capillary: 139 mg/dL — ABNORMAL HIGH (ref 65–99)
Glucose-Capillary: 159 mg/dL — ABNORMAL HIGH (ref 65–99)

## 2017-08-04 LAB — PHOSPHORUS: Phosphorus: 1.9 mg/dL — ABNORMAL LOW (ref 2.5–4.6)

## 2017-08-04 LAB — PROCALCITONIN: Procalcitonin: 0.23 ng/mL

## 2017-08-04 LAB — MAGNESIUM: MAGNESIUM: 2 mg/dL (ref 1.7–2.4)

## 2017-08-04 MED ORDER — CHLORHEXIDINE GLUCONATE 0.12 % MT SOLN
15.0000 mL | Freq: Two times a day (BID) | OROMUCOSAL | Status: DC
  Administered 2017-08-04 – 2017-08-07 (×7): 15 mL via OROMUCOSAL
  Filled 2017-08-04 (×5): qty 15

## 2017-08-04 MED ORDER — POTASSIUM CHLORIDE 20 MEQ/15ML (10%) PO SOLN
40.0000 meq | Freq: Once | ORAL | Status: AC
  Administered 2017-08-04: 40 meq
  Filled 2017-08-04: qty 30

## 2017-08-04 MED ORDER — POTASSIUM PHOSPHATES 15 MMOLE/5ML IV SOLN
30.0000 mmol | Freq: Once | INTRAVENOUS | Status: AC
  Administered 2017-08-04: 30 mmol via INTRAVENOUS
  Filled 2017-08-04: qty 10

## 2017-08-04 NOTE — Progress Notes (Signed)
PULMONARY / CRITICAL CARE MEDICINE   Name: Suzanne Brooks MRN: [id number] DOB: 10/07/1976    ADMISSION DATE:  07/30/2017 CONSULTATION DATE:  07/30/2017  REFERRING MD:  Conchita Paris  CHIEF COMPLAINT:  SAH  BRIEF HISTORY OF PRESENT ILLNESS:   41 y/o female with a history of hypertension and seizures presented on 9/13 with seizure, found to have a subarachnoid hemorrhage and intraparenchymal hemorrhage in left frontal lobe. She underwent left opthlmic aneurysm coiling, had an EVD placed on 9/14.   SUBJECTIVE:  Persistent fever overnight, coughing and protecting her airway  VITAL SIGNS: BP (!) 164/112   Pulse (!) 115   Temp (!) 102.8 F (39.3 C) (Oral)   Resp (!) 21   Ht  (1.6 m)   Wt 65.4 kg (144 lb 2.9 oz)   LMP  (LMP Unknown) Comment: neg preg test  SpO2 97%   BMI 25.54 kg/m   HEMODYNAMICS:    VENTILATOR SETTINGS:    INTAKE / OUTPUT: I/O last 3 completed shifts: In: 7240.6 [I.V.:3863.6; NG/GT:1237; IV Piggyback:2140] Out: 4973 [Urine:4450; Drains:523]  PHYSICAL EXAMINATION:  General:  Resting comfortably in bed, NAD HENT: Ventricular drain clean, dry, no drainage OP clear PULM: Decreased BS at the bases, coughing and protecting her airway CV: RRR, Nl S1/S2, -M/R/G. GI: Soft, NT, ND and +BS MSK: WNL Neuro: Awakens to voice, opens R eye, moves R hand, non-verbal  LABS:  BMET  Recent Labs Lab 08/02/17 0502 08/03/17 0519 08/04/17 0201  NA 138 139 140  K 3.9 2.7* 2.1*  CL 107 107 106  CO2 22 18* 22  BUN CREATININE 0.68 0.78 0.78  GLUCOSE 124* 138* 202*    Electrolytes  Recent Labs Lab 08/02/17 0502  08/03/17 0519 08/03/17 1625 08/04/17 0201  CALCIUM 8.6*  --  8.5*  --  8.9  MG 2.5*  < > 2.1 2.0 2.0  PHOS 1.7*  < > 1.8* 2.9 1.9*  < > = values in this interval not displayed.  CBC  Recent Labs Lab 08/02/17 0502 08/03/17 0519 08/04/17 0201  WBC 31.4* 39.7* 36.8*  HGB 9.5* 9.0* 9.6*  HCT 30.1* 27.6* 29.4*  PLT 239 220 246    Coag's  Recent Labs Lab 07/30/17 2039  APTT 37*  INR 1.07   Sepsis Markers  Recent Labs Lab 08/02/17 1016 08/02/17 1222 08/03/17 1014 08/04/17 0201  LATICACIDVEN 2.2* 2.6*  --   --   PROCALCITON  --   --  0.44 0.23   ABG  Recent Labs Lab 07/30/17 1807 07/30/17 2245  PHART 7.355 7.391  PCO2ART 47.8 30.5*  PO2ART 397.0* 211*   Liver Enzymes No results for input(s): AST, ALT, ALKPHOS, BILITOT, ALBUMIN in the last 168 hours.  Cardiac Enzymes No results for input(s): TROPONINI, PROBNP in the last 168 hours.  Glucose  Recent Labs Lab 08/03/17 1345 08/03/17 1535 08/03/17 2016 08/04/17 0018 08/04/17 0337 08/04/17 0739  GLUCAP 122* 137* 129* 104* 177* 139*   Imaging Ct Head Wo Contrast  Result Date: 08/03/2017 CLINICAL DATA:  Continued surveillance subarachnoid hemorrhage. Dense non-fluent aphasia. EXAM: CT HEAD WITHOUT CONTRAST TECHNIQUE: Contiguous axial images were obtained from the base of the skull through the vertex without intravenous contrast. COMPARISON:  Multiple priors. 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 Endovascular coil mass appears appropriately positioned. LEFT frontal parenchymal hemorrhage is partially resolving, no increase in size, with moderate surrounding edema. A RIGHT frontal twist drill has been performed  with placement of an extraventricular drainage catheter. The tip of the catheter traverses the RIGHT lateral ventricle and lies with its tip in the superior anterior third ventricle. There are no postprocedural complications. Ventricular size is slightly decreased, particularly with regard to dilatation of the temporal horns. Subarachnoid blood throughout the basilar cisterns is diminished. No extra-axial collections. No developing cortical infarction, although there is suggestion of early hypodensity in the posterior limb, LEFT internal capsule as well as possible early  hypodensity in the LEFT frontal and posterior frontal subcortical white matter. Vascular: No hyperdense vessel or unexpected calcification. Skull: Unremarkable twist drill defect.  No fracture. Sinuses/Orbits: Negative. Other: None. IMPRESSION: Satisfactory extraventricular drainage catheter placement. Slight decrease in ventricular size, particularly temporal horns. Resolving parenchymal and subarachnoid blood. No visible cortical infarction, but cannot exclude early ischemic change in the LEFT posterior limb internal capsule, as well as scattered areas of the LEFT frontal subcortical white matter. Electronically Signed   By: Elsie Stain M.D.   On: 08/03/2017 12:39   Dg Chest Port 1 View  Result Date: 08/04/2017 CLINICAL DATA:  Pneumonia EXAM: PORTABLE CHEST 1 VIEW COMPARISON:  08/02/2017 FINDINGS: NG has been repositioned and is now within the stomach Right lower lobe airspace disease is slightly improved. Left lung clear. Negative for heart failure or effusion IMPRESSION: NG in the stomach Right lower lobe airspace disease with mild improvement. Electronically Signed   By: Marlan Palau M.D.   On: 08/04/2017 07:09   Dg Abd Portable 1v  Result Date: 08/03/2017 CLINICAL DATA:  Nasogastric tube placement. EXAM: PORTABLE ABDOMEN - 1 VIEW COMPARISON:  Plain film of the abdomen dated 08/02/2017. FINDINGS: Nasogastric tube in the stomach with distal portion loosely coiled in the stomach fundus region. Again noted is gaseous distention of the colon, perhaps slightly worsened compared to the earlier exam. No evidence of free intraperitoneal air or fluid collection. IMPRESSION: 1. Nasogastric tube is adequately positioned with tip loosely coiled in the stomach fundus region. 2. Distended gas-filled loops of bowel throughout the abdomen and pelvis, mostly colonic in appearance, perhaps slightly worsened compared to yesterday's exam. Electronically Signed   By: Bary Richard M.D.   On: 08/03/2017 23:33      STUDIES:  CT head 9/13 > Extensive subarachnoid hemorrhage throughout the basilar cisterns, sylvian fissure and interhemispheric fissure question ruptured aneurysm. Additional intra cerebral hematoma within the inferior LEFT frontal lobe 3.5 x 1.4 x 1.8 cm, un common to have intraparenchymal extension of subarachnoid hemorrhage, question concomitant or subsequent fall with traumatic hemorrhage? CT head 9/14> stable left frontal intraparenchymal hemorrhage and moderate SAH, embolization material stable, EVD in place  CULTURES: 9/16 blood >  9/16 urine >   ANTIBIOTICS: 9/16 vanc >   9/16 ceftaz >   SIGNIFICANT EVENTS: 9/13 admit  LINES/TUBES: Ett 9/13 > 9/15 9/14 ventricular drain >   I reviewed CXR myself, RLL infiltrate noted  DISCUSSION: 41 y/o female with a past medical history of hypertension and seizure here with a left frontoparenchymal hemorrage and SAH.  Fever 9/16.  Discussed with PCCM-NP.  ASSESSMENT / PLAN:  NEUROLOGIC A:   Seizure history, unknown if on anti-epileptics SAH Left frontal intraparenchymal hemorrhage S/p L opth artery coiling P:   Per neurosurgery Continue keppra Continue nimodipine IVD per neurosurgery  PULMONARY A: Acute respiratory failure with hypoxemia secondary to inabilty to protect airway > resolved 9/15 CXR severely rotated, poor study > RLL pneumonia? No clear infiltrate on my review P:   Incentive spirometry HCAP  coverage Monitor closely for airway protection, concern is that with mental status patient may loose control of her airway and require intubation.  CARDIOVASCULAR A:  Hypertension Sinus tach from sepsis, not in shock P:  SBP goal 110-150 per neurology Continue cleviprex Monitor HR and BP as sepsis progresses  RENAL A:   No acute issues P:   Monitor BMET and UOP Replace electrolytes as needed  GASTROINTESTINAL A:   No acute issues dysphagia P:   Tube feeding per nutrition May need PEG tube  since failed swallow evaluation but will repeat first in AM prior to committing to PEG tube.  HEMATOLOGIC A:   Anemia wihtout bleeding Leukocytosis> stress response? P:  Monitor for bleeding  INFECTIOUS A:   Sepsis, HCAP? P:   Sepsis protocol Fluids, vanc, ceftaz, cultures Procalcitonin 0.44 and 0.23, will continue abx for now  ENDOCRINE A:   No acute issues   P:   Monitor glucose  FAMILY  - Updates: Hold in the ICU given IVD, persistent fever, pulmonary infiltrate and concern for airway protection given mental status.  PCCM will continue to follow.  - Inter-disciplinary family meet or Palliative Care meeting due by:  day 7  Discussed with PCCM-NP.  Alyson Reedy, M.D. [facility] Pulmonary/Critical Care Medicine. Pager: [phone]. After hours pager: [phone].  08/04/2017, 9:44 AM

## 2017-08-04 NOTE — Progress Notes (Signed)
Pt seen and examined. No issues overnight.   EXAM: Temp:  [98.7 F (37.1 C)-102.8 F (39.3 C)] 102.8 F (39.3 C) (09/18 0800) Pulse Rate:  [98-124] 115 (09/18 0900) Resp:  [17-30] 21 (09/18 0900) BP: (118-183)/(85-126) 164/112 (09/18 0900) SpO2:  [90 %-100 %] 97 % (09/18 0900) Weight:  [65.4 kg (144 lb 2.9 oz)] 65.4 kg (144 lb 2.9 oz) (09/18 0500) Intake/Output      09/17 0701 - 09/18 0700 09/18 0701 - 09/19 0700   I.V. (mL/kg) 2399.6 (36.7) 200 (3.1)   NG/GT 757 80   IV Piggyback 1990    Total Intake(mL/kg) 5146.6 (78.7) 280 (4.3)   Urine (mL/kg/hr) 2850 (1.8) 400 (1.8)   Drains 331 55   Total Output 3181 455   Net +1965.6 -175        Urine Occurrence 1 x    Stool Occurrence 2 x     Opens eyes to voice Says name, oriented to hospital, says its 1996 Follows commands BUE, LLE. Minimal movement RLE EVD in place, functional  LABS: Lab Results  Component Value Date   CREATININE 0.78 08/04/2017   BUN 6 08/04/2017   NA 140 08/04/2017   K 2.1 (LL) 08/04/2017   CL 106 08/04/2017   CO2 22 08/04/2017   Lab Results  Component Value Date   WBC 36.8 (H) 08/04/2017   HGB 9.6 (L) 08/04/2017   HCT 29.4 (L) 08/04/2017   MCV 84.7 08/04/2017   PLT 246 08/04/2017    IMPRESSION: - 41 y.o. female SAH d# 6 s/p coiling left ophthalmic aneurysm, remains stable.  PLAN: - Cont supportive care - Will keep EVD open at 5 for now.

## 2017-08-04 NOTE — Progress Notes (Signed)
Patient able to pull out NG tube with restraints on. Placed another NG, confirmed by XR. Notified [facility] of results that tube loosely coiled in fundus. Per Dr Jamison Neighbor ok to use NG tube. Restraints securely tied and safety mittens placed on patient.

## 2017-08-04 NOTE — Progress Notes (Signed)
eLink Physician-Brief Progress Note Patient Name: Suzanne Brooks DOB: November 18, 1975 MRN: [id number]   Date of Service  08/04/2017  HPI/Events of Note  Hypophosphatemia and hypokalemia. Normal renal function. Patient currently with tube feedings.   eICU Interventions  1. K-Phos 3 minimal IV 2. KCl 40 mEq via tube x1     Intervention Category Major Interventions: Electrolyte abnormality - evaluation and management  Lawanda Cousins 08/04/2017, 4:25 AM

## 2017-08-04 NOTE — Evaluation (Signed)
Speech Language Pathology Evaluation Patient Details Name: Suzanne Brooks MRN: [id number] DOB: 12/20/1975 Today's Date: 08/04/2017 Time: 4098-1191 SLP Time Calculation (min) (ACUTE ONLY): 15 min  Problem List:  Patient Active Problem List   Diagnosis Date Noted  . ICH (intracerebral hemorrhage) (HCC) 07/30/2017  . Subarachnoid hemorrhage (HCC) 07/30/2017  . Seizure-like activity (HCC)   . Acute respiratory failure with hypoxia [facility])    Past Medical History:  Past Medical History:  Diagnosis Date  . Hypertension   . Seizures (HCC)    Past Surgical History:  Past Surgical History:  Procedure Laterality Date  . RADIOLOGY WITH ANESTHESIA N/A 07/30/2017   Procedure: RADIOLOGY WITH ANESTHESIA/ DR. Gerlene Burdock;  Surgeon: Radiologist, Medication, MD;  Location: [facility] OR;  Service: Radiology;  Laterality: N/A;   HPI:  Pt is a 41 y/o female who presents with seizure activity. 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 ETT 9/13-9/15.    Assessment / Plan / Recommendation Clinical Impression  Pt presents with clear articulation, fluent speech, persisting dysphonia.  Follows simple commands and initiates to make needs known.  Oriented to person, but not elements of time, place or situation (continues to state it is 1966; cites reason for hospitalization as a "nervous breakdown").  Impaired short term memory, higher level attention, insight at this time.  SLP will follow for cognitive-communicative therapy in the context of SAH.     In addition, pt's swallow was reassessed at bedside.  She continues to present with low vocal intensity, periods of aphonia, weak cough in response to ice chips.  She does demonstrate adequate oral attention, effort at mastication, and a palpable swallow response, but not sufficient yet for instrumental study.  Pt will require an MBS prior to initiating a diet - prognosis for resuming oral diet is  quite good; pt needs a few more days before she is ready, but strongly doubt a PEG will be warranted. D/W RN, family.    SLP Assessment  SLP Recommendation/Assessment: Patient needs continued Speech Lanaguage Pathology Services    Follow Up Recommendations  Inpatient Rehab    Frequency and Duration min 2x/week  2 weeks      SLP Evaluation Cognition  Overall Cognitive Status: Impaired/Different from baseline Arousal/Alertness: Awake/alert Orientation Level: Oriented to person;Disoriented to place;Disoriented to time;Disoriented to situation Attention: Focused;Sustained Focused Attention: Appears intact Sustained Attention: Appears intact Memory: Impaired Memory Impairment: Storage deficit;Retrieval deficit Awareness: Impaired Awareness Impairment: Intellectual impairment Problem Solving: Impaired       Comprehension  Auditory Comprehension Commands: Within Functional Limits (for simple commands) Reading Comprehension Reading Status: Not tested    Expression Verbal Expression Initiation: No impairment Automatic Speech: Name Level of Generative/Spontaneous Verbalization: Sentence Repetition: No impairment   Oral / Motor  Motor Speech Overall Motor Speech: Appears within functional limits for tasks assessed Phonation: Aphonic   GO                    Carolan Shiver 08/04/2017, 11:58 AM  Marchelle Folks L. Samson Frederic, [place] CCC/SLP Pager [phone]

## 2017-08-04 NOTE — Progress Notes (Signed)
Physical Therapy Treatment Patient Details Name: Suzanne Brooks MRN: [id number] DOB: [birth year] Today's Date: 08/04/2017    History of Present Illness Pt is a 41 y/o female who presents with seizure activity. Pt was found to have a subarachnoid hemorrhage, and is now s/p coiling of left ophthalmic aneurysm with EVD on 9/14.PMHx: HTN, Sz    PT Comments    Pt progressing towards physical therapy goals. Was more engaged in session today, however felt she may have been fatigued - was not as verbally responsive and alert as earlier in the day. Pt was able to progress to standing EOB with +2 assist. She was able to hold static standing with decreased knee blocking for a short bout. CIR remains appropriate. Will continue to follow and progress as able per POC.    Follow Up Recommendations  CIR;Supervision/Assistance - 24 hour     Equipment Recommendations  Other (comment) (TBD by next venue of care)    Recommendations for Other Services Rehab consult     Precautions / Restrictions Precautions Precautions: Fall Precaution Comments: EVD Restrictions Weight Bearing Restrictions: No    Mobility  Bed Mobility Overal bed mobility: Needs Assistance Bed Mobility: Supine to Sit     Supine to sit: Max assist;+2 for physical assistance Sit to supine: Max assist;+2 for physical assistance   General bed mobility comments: pt with max assist to move bil LE off of bed, elevate trunk and pivot to EOB. pt initiating movement of LLE with cues but unable to clear bed  Transfers Overall transfer level: Needs assistance Equipment used: 2 person hand held assist Transfers: Sit to/from Stand Sit to Stand: Mod assist;+2 physical assistance         General transfer comment: mod +2 with Right knee blocked. Pt able to stand on LLE with buckling on right and assist for weight shift and balance, assist to rise and control descent. Scooting to EOB and sideways at EOB max +2 assist with pt initiating  effort. fatigued quickly and was unable to maintain standing.   Ambulation/Gait             General Gait Details: Unable   Stairs            Wheelchair Mobility    Modified Rankin (Stroke Patients Only) Modified Rankin (Stroke Patients Only) Pre-Morbid Rankin Score: No symptoms Modified Rankin: Severe disability     Balance Overall balance assessment: Needs assistance Sitting-balance support: Feet supported Sitting balance-Leahy Scale: Poor Sitting balance - Comments: Noted posterior lean and hip extension while sitting EOB and increased assist was required to scoot pt back and keep from sliding too close to EOB.  Postural control: Posterior lean Standing balance support: Bilateral upper extremity supported Standing balance-Leahy Scale: Poor Standing balance comment: max assist +2 to maintain standing balance                            Cognition Arousal/Alertness: Awake/alert Behavior During Therapy: Flat affect Overall Cognitive Status: Impaired/Different from baseline Area of Impairment: Orientation;Attention;Memory;Following commands;Safety/judgement;Awareness;Problem solving                 Orientation Level: Disoriented to;Time;Place;Situation Current Attention Level: Focused Memory: Decreased short-term memory Following Commands: Follows one step commands consistently Safety/Judgement: Decreased awareness of deficits Awareness: Intellectual Problem Solving: Slow processing;Requires verbal cues;Requires tactile cues General Comments: Pt with minimal verbalizations throughout session. Pt mainly answered questions with a head nod or yes/no      Exercises  General Comments        Pertinent Vitals/Pain Pain Assessment: Faces Faces Pain Scale: Hurts little more Pain Location: Generalized with mobility Pain Descriptors / Indicators: Discomfort Pain Intervention(s): Limited activity within patient's tolerance;Monitored during  session;Repositioned    Home Living                      Prior Function            PT Goals (current goals can now be found in the care plan section) Acute Rehab PT Goals Patient Stated Goal: none stated PT Goal Formulation: Patient unable to participate in goal setting Time For Goal Achievement: 08/15/17 Potential to Achieve Goals: Fair Progress towards PT goals: Progressing toward goals    Frequency    Min 3X/week      PT Plan Current plan remains appropriate    Co-evaluation              AM-PAC PT "6 Clicks" Daily Activity  Outcome Measure  Difficulty turning over in bed (including adjusting bedclothes, sheets and blankets)?: Unable Difficulty moving from lying on back to sitting on the side of the bed? : Unable Difficulty sitting down on and standing up from a chair with arms (e.g., wheelchair, bedside commode, etc,.)?: Unable Help needed moving to and from a bed to chair (including a wheelchair)?: Total Help needed walking in hospital room?: Total Help needed climbing 3-5 steps with a railing? : Total 6 Click Score: 6    End of Session Equipment Utilized During Treatment: Oxygen Activity Tolerance: Patient tolerated treatment well Patient left: in bed;with call bell/phone within reach;with restraints reapplied Nurse Communication: Mobility status;Precautions PT Visit Diagnosis: Muscle weakness (generalized) (M62.81);Other symptoms and signs involving the nervous system (R29.898);Other abnormalities of gait and mobility (R26.89)     Time: 1610-9604 PT Time Calculation (min) (ACUTE ONLY): 31 min  Charges:  $Therapeutic Activity: 23-37 mins                    G Codes:       Conni Slipper, PT, DPT Acute Rehabilitation Services Pager: [phone]    Marylynn Pearson 08/04/2017, 3:03 PM

## 2017-08-05 ENCOUNTER — Inpatient Hospital Stay (HOSPITAL_BASED_OUTPATIENT_CLINIC_OR_DEPARTMENT_OTHER): Payer: Medicaid Other

## 2017-08-05 ENCOUNTER — Inpatient Hospital Stay (HOSPITAL_COMMUNITY): Payer: Medicaid Other

## 2017-08-05 DIAGNOSIS — R509 Fever, unspecified: Secondary | ICD-10-CM

## 2017-08-05 DIAGNOSIS — I609 Nontraumatic subarachnoid hemorrhage, unspecified: Secondary | ICD-10-CM

## 2017-08-05 DIAGNOSIS — D72829 Elevated white blood cell count, unspecified: Secondary | ICD-10-CM

## 2017-08-05 LAB — BASIC METABOLIC PANEL
ANION GAP: 13 (ref 5–15)
BUN: 10 mg/dL (ref 6–20)
CHLORIDE: 106 mmol/L (ref 101–111)
CO2: 23 mmol/L (ref 22–32)
CREATININE: 0.73 mg/dL (ref 0.44–1.00)
Calcium: 8.9 mg/dL (ref 8.9–10.3)
GFR calc non Af Amer: >60 mL/min (ref >60)
Glucose, Bld: 188 mg/dL — ABNORMAL HIGH (ref 65–99)
Potassium: 2.3 mmol/L — CL (ref 3.5–5.1)
Sodium: 142 mmol/L (ref 135–145)

## 2017-08-05 LAB — GLUCOSE, CAPILLARY
GLUCOSE-CAPILLARY: 159 mg/dL — AB (ref 65–99)
GLUCOSE-CAPILLARY: 164 mg/dL — AB (ref 65–99)
Glucose-Capillary: 179 mg/dL — ABNORMAL HIGH (ref 65–99)
Glucose-Capillary: 141 mg/dL — ABNORMAL HIGH (ref 65–99)
Glucose-Capillary: 177 mg/dL — ABNORMAL HIGH (ref 65–99)

## 2017-08-05 LAB — MAGNESIUM: MAGNESIUM: 2.1 mg/dL (ref 1.7–2.4)

## 2017-08-05 LAB — TRIGLYCERIDES: Triglycerides: 179 mg/dL — ABNORMAL HIGH (ref <150)

## 2017-08-05 LAB — C DIFFICILE QUICK SCREEN W PCR REFLEX
C DIFFICILE (CDIFF) TOXIN: NEGATIVE
C DIFFICLE (CDIFF) ANTIGEN: NEGATIVE
C Diff interpretation: NOT DETECTED

## 2017-08-05 LAB — CBC
HEMATOCRIT: 29.4 % — AB (ref 36.0–46.0)
HEMOGLOBIN: 9.4 g/dL — AB (ref 12.0–15.0)
MCH: 26.9 pg (ref 26.0–34.0)
MCHC: 32 g/dL (ref 30.0–36.0)
MCV: 84.2 fL (ref 78.0–100.0)
Platelets: 269 10*3/uL (ref 150–400)
RBC: 3.49 MIL/uL — ABNORMAL LOW (ref 3.87–5.11)
RDW: 18.2 % — ABNORMAL HIGH (ref 11.5–15.5)
WBC: 30.5 10*3/uL — ABNORMAL HIGH (ref 4.0–10.5)

## 2017-08-05 LAB — VANCOMYCIN, TROUGH: VANCOMYCIN TR: 7 ug/mL — AB (ref 15–20)

## 2017-08-05 LAB — PROCALCITONIN: PROCALCITONIN: 0.21 ng/mL

## 2017-08-05 MED ORDER — VANCOMYCIN HCL IN DEXTROSE 750-5 MG/150ML-% IV SOLN
750.0000 mg | Freq: Three times a day (TID) | INTRAVENOUS | Status: DC
  Administered 2017-08-06 – 2017-08-08 (×7): 750 mg via INTRAVENOUS
  Filled 2017-08-05 (×9): qty 150

## 2017-08-05 MED ORDER — POTASSIUM CHLORIDE 10 MEQ/100ML IV SOLN
10.0000 meq | INTRAVENOUS | Status: AC
  Administered 2017-08-05 (×4): 10 meq via INTRAVENOUS
  Filled 2017-08-05 (×4): qty 100

## 2017-08-05 MED ORDER — POTASSIUM CHLORIDE 20 MEQ/15ML (10%) PO SOLN
40.0000 meq | Freq: Three times a day (TID) | ORAL | Status: AC
  Administered 2017-08-05: 40 meq
  Filled 2017-08-05: qty 30

## 2017-08-05 MED ORDER — POTASSIUM CHLORIDE 20 MEQ/15ML (10%) PO SOLN
40.0000 meq | ORAL | Status: AC
  Administered 2017-08-05 (×2): 40 meq
  Filled 2017-08-05 (×2): qty 30

## 2017-08-05 MED ORDER — IBUPROFEN 100 MG/5ML PO SUSP: 600.0000 mg | Freq: Four times a day (QID) | ORAL | Status: DC

## 2017-08-05 MED ORDER — IBUPROFEN 100 MG/5ML PO SUSP
600.0000 mg | Freq: Four times a day (QID) | ORAL | Status: DC | PRN
  Administered 2017-08-05 – 2017-08-10 (×8): 600 mg via ORAL
  Filled 2017-08-05 (×8): qty 30

## 2017-08-05 NOTE — Progress Notes (Signed)
PULMONARY / CRITICAL CARE MEDICINE   Name: Suzanne Brooks MRN: [id number] DOB: May 11, 1976    ADMISSION DATE:  07/30/2017 CONSULTATION DATE:  07/30/2017  REFERRING MD:  Conchita Paris  CHIEF COMPLAINT:  SAH  BRIEF HISTORY OF PRESENT ILLNESS:   41 y/o female with a history of hypertension and seizures presented on 9/13 with seizure, found to have a subarachnoid hemorrhage and intraparenchymal hemorrhage in left frontal lobe. She underwent left opthlmic aneurysm coiling, had an EVD placed on 9/14.   SUBJECTIVE:  Persistent fevers overnight  VITAL SIGNS: BP (!) 157/115   Pulse (!) 113   Temp (!) 101.8 F (38.8 C) (Core (Comment))   Resp 19   Ht  (1.6 m)   Wt 67.7 kg (149 lb 4 oz)   LMP  (LMP Unknown) Comment: neg preg test  SpO2 96%   BMI 26.44 kg/m   HEMODYNAMICS:    VENTILATOR SETTINGS:    INTAKE / OUTPUT: I/O last 3 completed shifts: In: 6108.7 [I.V.:3651.7; NG/GT:1537; IV Piggyback:920] Out: 4433 [Urine:3850; Emesis/NG output:100; Drains:483]  PHYSICAL EXAMINATION:  General:  Resting comfortably in bed, NAD, arousable, no nuchal rigidity HENT: Ventricular drain clean, dry, no drainage OP clear PULM: Decreased BS at the bases, coughing and protecting her airway CV: RRR, Nl S1/S2, -M/R/G. GI: Soft, NT, ND and +BS MSK: WNL Neuro: Awakens to voice, opens R eye, moves R hand, non-verbal  LABS:  BMET  Recent Labs Lab 08/03/17 0519 08/04/17 0201 08/05/17 0528  NA 139 140 142  K 2.7* 2.1* 2.3*  CL 107 106 106  CO2 18* 22 23  BUN CREATININE 0.78 0.78 0.73  GLUCOSE 138* 202* 188*   Electrolytes  Recent Labs Lab 08/03/17 0519 08/03/17 1625 08/04/17 0201 08/05/17 0528  CALCIUM 8.5*  --  8.9 8.9  MG 2.1 2.0 2.0 2.1  PHOS 1.8* 2.9 1.9*  --     CBC  Recent Labs Lab 08/03/17 0519 08/04/17 0201 08/05/17 0528  WBC 39.7* 36.8* 30.5*  HGB 9.0* 9.6* 9.4*  HCT 27.6* 29.4* 29.4*  PLT 220 246 269   Coag's  Recent Labs Lab 07/30/17 2039   APTT 37*  INR 1.07   Sepsis Markers  Recent Labs Lab 08/02/17 1016 08/02/17 1222 08/03/17 1014 08/04/17 0201 08/05/17 0528  LATICACIDVEN 2.2* 2.6*  --   --   --   PROCALCITON  --   --  0.44 0.23 0.21   ABG  Recent Labs Lab 07/30/17 1807 07/30/17 2245  PHART 7.355 7.391  PCO2ART 47.8 30.5*  PO2ART 397.0* 211*   Liver Enzymes No results for input(s): AST, ALT, ALKPHOS, BILITOT, ALBUMIN in the last 168 hours.  Cardiac Enzymes No results for input(s): TROPONINI, PROBNP in the last 168 hours.  Glucose  Recent Labs Lab 08/04/17 0739 08/04/17 1121 08/04/17 1529 08/04/17 2001 08/04/17 2342 08/05/17 0812  GLUCAP 139* 159* 137* 130* 165* 159*   Imaging Dg Chest Port 1 View  Result Date: 08/05/2017 CLINICAL DATA:  Respiratory failure, pneumonia EXAM: PORTABLE CHEST 1 VIEW COMPARISON:  08/04/2017 FINDINGS: Consolidation in the right lower lobe is similar to prior study. Cardiomegaly. No confluent opacity on the left. No effusions. NG tube is in the stomach. IMPRESSION: Continued right lower lobe airspace disease.  No change. Electronically Signed   By: Charlett Nose M.D.   On: 08/05/2017 08:38     STUDIES:  CT head 9/13 > Extensive subarachnoid hemorrhage throughout the basilar cisterns, sylvian fissure and interhemispheric fissure question ruptured aneurysm.  Additional intra cerebral hematoma within the inferior LEFT frontal lobe 3.5 x 1.4 x 1.8 cm, un common to have intraparenchymal extension of subarachnoid hemorrhage, question concomitant or subsequent fall with traumatic hemorrhage? CT head 9/14> stable left frontal intraparenchymal hemorrhage and moderate SAH, embolization material stable, EVD in place  CULTURES: 9/16 blood >  9/16 urine >   ANTIBIOTICS: 9/16 vanc >   9/16 ceftaz >   SIGNIFICANT EVENTS: 9/13 admit  LINES/TUBES: Ett 9/13 > 9/15 9/14 ventricular drain >   I reviewed CXR myself, RLL infiltrate noted  DISCUSSION: 41 y/o female with a  past medical history of hypertension and seizure here with a left frontoparenchymal hemorrage and SAH.  Fever 9/16.  Discussed with PCCM-NP.  ASSESSMENT / PLAN:  NEUROLOGIC A:   Seizure history, unknown if on anti-epileptics SAH Left frontal intraparenchymal hemorrhage S/p L opth artery coiling P:   Per neurosurgery Continue keppra Continue nimodipine IVD per neurosurgery - ?tap today given persistent fevers  PULMONARY A: Acute respiratory failure with hypoxemia secondary to inabilty to protect airway > resolved 9/15 CXR severely rotated, poor study > RLL pneumonia? No clear infiltrate on my review P:   Incentive spirometry HCAP coverage Arousable but concern with fever and AMS for airway protection  CARDIOVASCULAR A:  Hypertension Sinus tach from sepsis, not in shock P:  SBP goal 110-150 per neurology Continue cleviprex, hydralazine and lopressor Monitor HR and BP as sepsis progresses  RENAL A:   Persistent hypokalemia P:   Monitor BMET and UOP Replace electrolytes as needed KCl 40 meq IV and 120 total PO (40 already given)  GASTROINTESTINAL A:   No acute issues dysphagia P:   Tube feeding per nutrition May need PEG tube since failed swallow evaluation but will repeat first in AM prior to committing to PEG tube.  HEMATOLOGIC A:   Anemia wihtout bleeding Leukocytosis> stress response? P:  Monitor for bleeding  INFECTIOUS A:   Sepsis, HCAP? P:   Sepsis protocol Fluids, vanc, ceftaz, cultures Procalcitonin 0.44 and 0.23 and 0.21, will continue abx for now Tap drain today per neurosurgery If remains inconclusive may need to involve ID CXR in AM  ENDOCRINE A:   No acute issues   P:   Monitor glucose  FAMILY  - Updates: Hold in the ICU given IVD, persistent fever, pulmonary infiltrate and concern for airway protection given mental status.  PCCM will continue to follow.  - Inter-disciplinary family meet or Palliative Care meeting due by:  day  7  The patient is critically ill with multiple organ systems failure and requires high complexity decision making for assessment and support, frequent evaluation and titration of therapies, application of advanced monitoring technologies and extensive interpretation of multiple databases.   Critical Care Time devoted to patient care services described in this note is  35  Minutes. This time reflects time of care of this signee Dr Koren Bound. This critical care time does not reflect procedure time, or teaching time or supervisory time of PA/NP/Med student/Med Resident etc but could involve care discussion time.  Alyson Reedy, M.D. [facility] Pulmonary/Critical Care Medicine. Pager: [phone]. After hours pager: [phone].  08/05/2017, 9:15 AM

## 2017-08-05 NOTE — Progress Notes (Signed)
Pharmacy Antibiotic Note  Suzanne Brooks is a 41 y.o. female admitted on 07/30/2017 with sepsis and fever of unknown origin.  Pharmacy has been consulted for vancomycin/ceftazidime dosing - day #4. WBC down 30.5 and Tmax 103.2. Renal function is stable wnl, and estimated CrCl ~ 13ml/min, good UOP.  Plan: Vancomycin 750 mg every 12 hours Ceftazidime 2 gm every 8 hours  Monitor renal function, cultures, and clinical s/sx of improvement Vancomycin trough today if CCM continuing  Height:  (160 cm) Weight: 149 lb 4 oz (67.7 kg) IBW/kg (Calculated) : 52.4  Temp (24hrs), Avg:101.4 F (38.6 C), Min:99.3 F (37.4 C), Max:103.2 F (39.6 C)   Recent Labs Lab 08/01/17 0004 08/01/17 0344 08/02/17 0502 08/02/17 1016 08/02/17 1222 08/03/17 0519 08/04/17 0201 08/05/17 0528  WBC  --  22.7* 31.4*  --   --  39.7* 36.8* 30.5*  CREATININE 0.81  --  0.68  --   --  0.78 0.78 0.73  LATICACIDVEN  --   --   --  2.2* 2.6*  --   --   --     Estimated Creatinine Clearance: 83.7 mL/min (by C-G formula based on SCr of 0.73 mg/dL).    No Known Allergies  Antimicrobials this admission: 9/16 Vanc >> 9/16 Ceftaz >>  Microbiology results: 9/13 MRSA PCR: Negative 9/16 BCx: ngtd 9/16 UC: mult species, recollect   Babs Bertin, PharmD, BCPS Clinical Pharmacist Rx Phone # for today: [phone] After 3:30PM, please call Main Rx: #[id number] 08/05/2017 7:49 AM

## 2017-08-05 NOTE — Progress Notes (Signed)
ANTIBIOTIC CONSULT NOTE   Pharmacy Consult for Vanco Indication: sepsis, PNA  No Known Allergies  Patient Measurements: Height:  (160 cm) Weight: 149 lb 4 oz (67.7 kg) IBW/kg (Calculated) : 52.4 Adjusted Body Weight:    Vital Signs: Temp: 100.3 F (37.9 C) (09/19 2000) Temp Source: Oral (09/19 2000) BP: 149/109 (09/19 2200) Pulse Rate: 132 (09/19 2200) Intake/Output from previous day: 09/18 0701 - 09/19 0700 In: 3743.7 [I.V.:2501.7; NG/GT:1132; IV Piggyback:110] Out: 2820 [Urine:2400; Emesis/NG output:100; Drains:320] Intake/Output from this shift: Total I/O In: 597 [I.V.:337; NG/GT:150; IV Piggyback:110] Out: 55 [Drains:55]  Labs:  Recent Labs  08/03/17 0519 08/04/17 0201 08/05/17 0528  WBC 39.7* 36.8* 30.5*  HGB 9.0* 9.6* 9.4*  PLT 220 246 269  CREATININE 0.78 0.78 0.73   Estimated Creatinine Clearance: 83.7 mL/min (by C-G formula based on SCr of 0.73 mg/dL).  Recent Labs  08/05/17 2119  [facility] 7*     Microbiology:  Medical History: Past Medical History:  Diagnosis Date  . Hypertension   . Seizures (HCC)     Assessment:  ID: D4 Abx for sepsis, ?PNA. Tmax/24h 103.1, WBC down 36.8, LA 2.6, PCT 0.44>0.21 Vanco trough 7 low. Doses charted. Renal function stable.  Goal of Therapy:  Vancomycin trough level 15-20 mcg/ml  Plan:  Increase Vancomycin to  IV q 8hr.  Suzanne Brooks, PharmD, BCPS Clinical Staff Pharmacist Pager [phone]  Suzanne Brooks 08/05/2017,10:18 PM

## 2017-08-05 NOTE — Progress Notes (Signed)
eLink Physician-Brief Progress Note Patient Name: Ayah Cozzolino DOB: [birth year] MRN: [id number]   Date of Service  08/05/2017  HPI/Events of Note    eICU Interventions  Severe Hypokalemia -repleted Check Mg     Intervention Category Intermediate Interventions: Electrolyte abnormality - evaluation and management  ALVA,RAKESH V. 08/05/2017, 6:18 AM

## 2017-08-05 NOTE — Consult Note (Signed)
[facility] for Infectious Disease  Date of Admission:  07/30/2017  Date of Consult:  08/05/2017  Reason for Consult: Leukocytosis Referring Physician: Yacoub  Impression/Recommendation Leukocytosis, fever RLL pneumonia ICH  Would: Check CSF for iatrogenic meningitis Check stool C diff.  No change in anbx for now Aim for 8 days of anbx unless other source found Certainly this could all be from her [place] (fever and leukocytosis)  Thank you so much for this interesting consult,   Bobby Rumpf (pager) [phone] [email or web address]  Suzanne Brooks is an 41 y.o. female.  HPI: 41 yo F with hx of HTN and seizures borught to ED on 9-13 after a seizure. She was found on CT to have subarachnoid hemorrhage. She had further seizures and required intubation.  She as taken to IR On 9-13 and had angiogram and L opthalmic aneurysm coiling.  She underwent frontal ventriculostomy on 9-14.  By 9-15 she was able to be extubated.  Her f/u CXR on 9-16 showed "persistent RLL pna". She began to develop fever around this time. She was started on vanco/ceftaz.  WBC was 13.1 on adm and has gone as high as 39.7 (9-17).  WBC 30.5 today.   Past Medical History:  Diagnosis Date  . Hypertension   . Seizures ([place])     Past Surgical History:  Procedure Laterality Date  . RADIOLOGY WITH ANESTHESIA N/A 07/30/2017   Procedure: RADIOLOGY WITH ANESTHESIA/ DR. Bradly Chris;  Surgeon: Radiologist, Medication, MD;  Location: [place];  Service: Radiology;  Laterality: N/A;     No Known Allergies  Medications:  Scheduled: .  stroke: mapping our early stages of recovery book   Does not apply Once  . chlorhexidine  15 mL Mouth Rinse BID  . docusate  100 mg Per Tube BID  . feeding supplement (PRO-STAT SUGAR FREE 64)  30 mL Per Tube Daily  . multivitamin  15 mL Per Tube Daily  . niMODipine  60 mg Oral Q4H   Or  . NiMODipine  60 mg Per Tube Q4H  . pantoprazole  40 mg Oral Daily   Or  .  pantoprazole sodium  40 mg Per Tube Daily  . potassium chloride  40 mEq Per Tube TID    Abtx:  Anti-infectives    Start     Dose/Rate Route Frequency Ordered Stop   08/02/17 2130  vancomycin (VANCOCIN) IVPB 750 mg/150 ml premix     750 mg 150 mL/hr over 60 Minutes Intravenous Every 12 hours 08/02/17 0902     08/02/17 0930  vancomycin (VANCOCIN) IVPB 1000 mg/200 mL premix     1,000 mg 200 mL/hr over 60 Minutes Intravenous  Once 08/02/17 0902 08/02/17 1116   08/02/17 0930  cefTAZidime (FORTAZ) 2 g in dextrose 5 % 50 mL IVPB     2 g 100 mL/hr over 30 Minutes Intravenous Every 8 hours 08/02/17 0902        Total days of antibiotics: 2 vanco/ceftaz          Social History:  reports that she drinks alcohol. She reports that she does not use drugs. Her tobacco history is not on file.  History reviewed. No pertinent family history.  History obtained from chart review and unobtainable from patient due to mental status General ROS: see HPI.   Blood pressure (!) 152/97, pulse (!) 103, temperature (!) 100.6 F (38.1 C), temperature source Core (Comment), resp. rate 18, height 5' 3"  (1.6 m), weight 67.7 kg (149 lb 4 oz),  SpO2 100 %. General appearance: alert and no distress Head: drain on R occiput, non-tender. clean.  Eyes: negative findings: pupils equal, round, reactive to light and accomodation and droop of L eyelid Neck: no adenopathy, supple, symmetrical, trachea midline and FROM Lungs: diminished breath sounds anterior - left and rhonchi anterior - bilateral Heart: tachycardia Abdomen: normal findings: bowel sounds normal and soft, non-tender Extremities: edema none and no cordis felt in UE, no tenderness with flexion of feet.  Skin: peripheral lines in LUE clean.    Results for orders placed or performed during the hospital encounter of 07/30/17 (from the past 48 hour(s))  Glucose, capillary     Status: Abnormal   Collection Time: 08/03/17  8:16 PM  Result Value Ref Range    Glucose-Capillary 129 (H) 65 - 99 mg/dL   Comment 1 Repeat Test   Glucose, capillary     Status: Abnormal   Collection Time: 08/04/17 12:18 AM  Result Value Ref Range   Glucose-Capillary 104 (H) 65 - 99 mg/dL   Comment 1 Notify RN   Magnesium     Status: None   Collection Time: 08/04/17  2:01 AM  Result Value Ref Range   Magnesium 2.0 1.7 - 2.4 mg/dL  Phosphorus     Status: Abnormal   Collection Time: 08/04/17  2:01 AM  Result Value Ref Range   Phosphorus 1.9 (L) 2.5 - 4.6 mg/dL  CBC with Differential/Platelet     Status: Abnormal   Collection Time: 08/04/17  2:01 AM  Result Value Ref Range   WBC 36.8 (H) 4.0 - 10.5 K/uL   RBC 3.47 (L) 3.87 - 5.11 MIL/uL   Hemoglobin 9.6 (L) 12.0 - 15.0 g/dL   HCT 29.4 (L) 36.0 - 46.0 %   MCV 84.7 78.0 - 100.0 fL   MCH 27.7 26.0 - 34.0 pg   MCHC 32.7 30.0 - 36.0 g/dL   RDW 17.8 (H) 11.5 - 15.5 %   Platelets 246 150 - 400 K/uL   Neutrophils Relative % 90 %   Lymphocytes Relative 3 %   Monocytes Relative 6 %   Eosinophils Relative 1 %   Basophils Relative 0 %   Neutro Abs 33.1 (H) 1.7 - 7.7 K/uL   Lymphs Abs 1.1 0.7 - 4.0 K/uL   Monocytes Absolute 2.2 (H) 0.1 - 1.0 K/uL   Eosinophils Absolute 0.4 0.0 - 0.7 K/uL   Basophils Absolute 0.0 0.0 - 0.1 K/uL   RBC Morphology POLYCHROMASIA PRESENT     Comment: TARGET CELLS   WBC Morphology VACUOLATED NEUTROPHILS   Basic metabolic panel     Status: Abnormal   Collection Time: 08/04/17  2:01 AM  Result Value Ref Range   Sodium 140 135 - 145 mmol/L   Potassium 2.1 (LL) 3.5 - 5.1 mmol/L    Comment: DELTA CHECK NOTED CRITICAL RESULT CALLED TO, READ BACK BY AND VERIFIED WITH: ALLEN J,RN 08/04/17 0242 WAYK    Chloride 106 101 - 111 mmol/L   CO2 22 22 - 32 mmol/L   Glucose, Bld 202 (H) 65 - 99 mg/dL   BUN 6 6 - 20 mg/dL   Creatinine, Ser 0.78 0.44 - 1.00 mg/dL   Calcium 8.9 8.9 - 10.3 mg/dL   GFR calc non Af Amer >60 >60 mL/min   GFR calc Af Amer >60 >60 mL/min    Comment: (NOTE) The eGFR has  been calculated using the CKD EPI equation. This calculation has not been validated in all clinical situations. eGFR's  persistently <60 mL/min signify possible Chronic Kidney Disease.    Anion gap 12 5 - 15  Procalcitonin     Status: None   Collection Time: 08/04/17  2:01 AM  Result Value Ref Range   Procalcitonin 0.23 ng/mL    Comment:        Interpretation: PCT (Procalcitonin) <= 0.5 ng/mL: Systemic infection (sepsis) is not likely. Local bacterial infection is possible. (NOTE)         ICU PCT Algorithm               Non ICU PCT Algorithm    ----------------------------     ------------------------------         PCT < 0.25 ng/mL                 PCT < 0.1 ng/mL     Stopping of antibiotics            Stopping of antibiotics       strongly encouraged.               strongly encouraged.    ----------------------------     ------------------------------       PCT level decrease by               PCT < 0.25 ng/mL       >= 80% from peak PCT       OR PCT 0.25 - 0.5 ng/mL          Stopping of antibiotics                                             encouraged.     Stopping of antibiotics           encouraged.    ----------------------------     ------------------------------       PCT level decrease by              PCT >= 0.25 ng/mL       < 80% from peak PCT        AND PCT >= 0.5 ng/mL            Continuin g antibiotics                                              encouraged.       Continuing antibiotics            encouraged.    ----------------------------     ------------------------------     PCT level increase compared          PCT > 0.5 ng/mL         with peak PCT AND          PCT >= 0.5 ng/mL             Escalation of antibiotics                                          strongly encouraged.      Escalation of antibiotics        strongly encouraged.   Glucose, capillary     Status:  Abnormal   Collection Time: 08/04/17  3:37 AM  Result Value Ref Range   Glucose-Capillary  177 (H) 65 - 99 mg/dL  Glucose, capillary     Status: Abnormal   Collection Time: 08/04/17  7:39 AM  Result Value Ref Range   Glucose-Capillary 139 (H) 65 - 99 mg/dL   Comment 1 Notify RN    Comment 2 Document in Chart   Glucose, capillary     Status: Abnormal   Collection Time: 08/04/17 11:21 AM  Result Value Ref Range   Glucose-Capillary 159 (H) 65 - 99 mg/dL   Comment 1 Notify RN    Comment 2 Document in Chart   Glucose, capillary     Status: Abnormal   Collection Time: 08/04/17  3:29 PM  Result Value Ref Range   Glucose-Capillary 137 (H) 65 - 99 mg/dL   Comment 1 Notify RN    Comment 2 Document in Chart   Glucose, capillary     Status: Abnormal   Collection Time: 08/04/17  8:01 PM  Result Value Ref Range   Glucose-Capillary 130 (H) 65 - 99 mg/dL  Glucose, capillary     Status: Abnormal   Collection Time: 08/04/17 11:42 PM  Result Value Ref Range   Glucose-Capillary 165 (H) 65 - 99 mg/dL  Triglycerides     Status: Abnormal   Collection Time: 08/05/17  5:28 AM  Result Value Ref Range   Triglycerides 179 (H) <150 mg/dL  Procalcitonin     Status: None   Collection Time: 08/05/17  5:28 AM  Result Value Ref Range   Procalcitonin 0.21 ng/mL    Comment:        Interpretation: PCT (Procalcitonin) <= 0.5 ng/mL: Systemic infection (sepsis) is not likely. Local bacterial infection is possible. (NOTE)         ICU PCT Algorithm               Non ICU PCT Algorithm    ----------------------------     ------------------------------         PCT < 0.25 ng/mL                 PCT < 0.1 ng/mL     Stopping of antibiotics            Stopping of antibiotics       strongly encouraged.               strongly encouraged.    ----------------------------     ------------------------------       PCT level decrease by               PCT < 0.25 ng/mL       >= 80% from peak PCT       OR PCT 0.25 - 0.5 ng/mL          Stopping of antibiotics                                             encouraged.      Stopping of antibiotics           encouraged.    ----------------------------     ------------------------------       PCT level decrease by              PCT >= 0.25 ng/mL       < 80%  from peak PCT        AND PCT >= 0.5 ng/mL            Continuin g antibiotics                                              encouraged.       Continuing antibiotics            encouraged.    ----------------------------     ------------------------------     PCT level increase compared          PCT > 0.5 ng/mL         with peak PCT AND          PCT >= 0.5 ng/mL             Escalation of antibiotics                                          strongly encouraged.      Escalation of antibiotics        strongly encouraged.   CBC     Status: Abnormal   Collection Time: 08/05/17  5:28 AM  Result Value Ref Range   WBC 30.5 (H) 4.0 - 10.5 K/uL   RBC 3.49 (L) 3.87 - 5.11 MIL/uL   Hemoglobin 9.4 (L) 12.0 - 15.0 g/dL   HCT 29.4 (L) 36.0 - 46.0 %   MCV 84.2 78.0 - 100.0 fL   MCH 26.9 26.0 - 34.0 pg   MCHC 32.0 30.0 - 36.0 g/dL   RDW 18.2 (H) 11.5 - 15.5 %   Platelets 269 150 - 400 K/uL  Basic metabolic panel     Status: Abnormal   Collection Time: 08/05/17  5:28 AM  Result Value Ref Range   Sodium 142 135 - 145 mmol/L   Potassium 2.3 (LL) 3.5 - 5.1 mmol/L    Comment: CRITICAL RESULT CALLED TO, READ BACK BY AND VERIFIED WITH: PEARSE N,RN 08/05/17 0614 WAYK    Chloride 106 101 - 111 mmol/L   CO2 23 22 - 32 mmol/L   Glucose, Bld 188 (H) 65 - 99 mg/dL   BUN 10 6 - 20 mg/dL   Creatinine, Ser 0.73 0.44 - 1.00 mg/dL   Calcium 8.9 8.9 - 10.3 mg/dL   GFR calc non Af Amer >60 >60 mL/min   GFR calc Af Amer >60 >60 mL/min    Comment: (NOTE) The eGFR has been calculated using the CKD EPI equation. This calculation has not been validated in all clinical situations. eGFR's persistently <60 mL/min signify possible Chronic Kidney Disease.    Anion gap 13 5 - 15  Magnesium     Status: None   Collection Time:  08/05/17  5:28 AM  Result Value Ref Range   Magnesium 2.1 1.7 - 2.4 mg/dL  Glucose, capillary     Status: Abnormal   Collection Time: 08/05/17  8:12 AM  Result Value Ref Range   Glucose-Capillary 159 (H) 65 - 99 mg/dL   Comment 1 Notify RN    Comment 2 Document in Chart   Glucose, capillary     Status: Abnormal   Collection Time: 08/05/17 11:28 AM  Result Value Ref Range   Glucose-Capillary 179 (H) 65 -  99 mg/dL   Comment 1 Notify RN    Comment 2 Document in Chart   Glucose, capillary     Status: Abnormal   Collection Time: 08/05/17  3:59 PM  Result Value Ref Range   Glucose-Capillary 141 (H) 65 - 99 mg/dL   Comment 1 Notify RN    Comment 2 Document in Chart       Component Value Date/Time   SDES URINE, CATHETERIZED 08/02/2017 1226   SPECREQUEST NONE 08/02/2017 1226   CULT MULTIPLE SPECIES PRESENT, SUGGEST RECOLLECTION (A) 08/02/2017 1226   REPTSTATUS 08/03/2017 FINAL 08/02/2017 1226   Dg Chest Port 1 View  Result Date: 08/05/2017 CLINICAL DATA:  Respiratory failure, pneumonia EXAM: PORTABLE CHEST 1 VIEW COMPARISON:  08/04/2017 FINDINGS: Consolidation in the right lower lobe is similar to prior study. Cardiomegaly. No confluent opacity on the left. No effusions. NG tube is in the stomach. IMPRESSION: Continued right lower lobe airspace disease.  No change. Electronically Signed   By: Rolm Baptise M.D.   On: 08/05/2017 08:38   Dg Chest Port 1 View  Result Date: 08/04/2017 CLINICAL DATA:  Pneumonia EXAM: PORTABLE CHEST 1 VIEW COMPARISON:  08/02/2017 FINDINGS: NG has been repositioned and is now within the stomach Right lower lobe airspace disease is slightly improved. Left lung clear. Negative for heart failure or effusion IMPRESSION: NG in the stomach Right lower lobe airspace disease with mild improvement. Electronically Signed   By: Franchot Gallo M.D.   On: 08/04/2017 07:09   Dg Abd Portable 1v  Result Date: 08/03/2017 CLINICAL DATA:  Nasogastric tube placement. EXAM:  PORTABLE ABDOMEN - 1 VIEW COMPARISON:  Plain film of the abdomen dated 08/02/2017. FINDINGS: Nasogastric tube in the stomach with distal portion loosely coiled in the stomach fundus region. Again noted is gaseous distention of the colon, perhaps slightly worsened compared to the earlier exam. No evidence of free intraperitoneal air or fluid collection. IMPRESSION: 1. Nasogastric tube is adequately positioned with tip loosely coiled in the stomach fundus region. 2. Distended gas-filled loops of bowel throughout the abdomen and pelvis, mostly colonic in appearance, perhaps slightly worsened compared to yesterday's exam. Electronically Signed   By: Franki Cabot M.D.   On: 08/03/2017 23:33   Recent Results (from the past 240 hour(s))  MRSA PCR Screening     Status: None   Collection Time: 07/30/17  8:30 PM  Result Value Ref Range Status   MRSA by PCR NEGATIVE NEGATIVE Final    Comment:        The GeneXpert MRSA Assay (FDA approved for NASAL specimens only), is one component of a comprehensive MRSA colonization surveillance program. It is not intended to diagnose MRSA infection nor to guide or monitor treatment for MRSA infections.   Culture, blood (Routine X 2) w Reflex to ID Panel     Status: None (Preliminary result)   Collection Time: 08/02/17 10:30 AM  Result Value Ref Range Status   Specimen Description BLOOD RIGHT HAND  Final   Special Requests   Final    BOTTLES DRAWN AEROBIC ONLY Blood Culture results may not be optimal due to an inadequate volume of blood received in culture bottles   Culture NO GROWTH 3 DAYS  Final   Report Status PENDING  Incomplete  Culture, blood (Routine X 2) w Reflex to ID Panel     Status: None (Preliminary result)   Collection Time: 08/02/17 10:35 AM  Result Value Ref Range Status   Specimen Description BLOOD RIGHT HAND  Final   Special Requests   Final    BOTTLES DRAWN AEROBIC ONLY Blood Culture adequate volume   Culture NO GROWTH 3 DAYS  Final    Report Status PENDING  Incomplete  Culture, Urine     Status: Abnormal   Collection Time: 08/02/17 12:26 PM  Result Value Ref Range Status   Specimen Description URINE, CATHETERIZED  Final   Special Requests NONE  Final   Culture MULTIPLE SPECIES PRESENT, SUGGEST RECOLLECTION (A)  Final   Report Status 08/03/2017 FINAL  Final      08/05/2017, 4:30 PM     LOS: 6 days    Records and images were personally reviewed where available.  Bobby Rumpf, MD [facility] for [facility] [phone] 08/05/2017, 4:30 PM

## 2017-08-05 NOTE — Progress Notes (Signed)
eLink Physician-Brief Progress Note Patient Name: Suzanne Brooks DOB: [birth year] MRN: [id number]   Date of Service  08/05/2017  HPI/Events of Note  Agitation - request to renew bilateral soft wrist restraints.   eICU Interventions  Will renew order for soft bilateral wrist restraints.      Intervention Category Minor Interventions: Agitation / anxiety - evaluation and management  Lenell Antu 08/05/2017, 7:47 PM

## 2017-08-05 NOTE — Progress Notes (Signed)
Transcranial Doppler  Date POD PCO2 HCT BP  MCA ACA PCA OPHT SIPH VERT Basilar  9/14 vs     Right  Left   49  44   -14  -44   21  37   -30  -35     -37    9/17 je     Right  Left   79  143   -45  -67   12  32   31  19   109  42   -60  -37   -54      9/19rs     Right  Left   164  181   -38  -71   48  66   29  26   59  46   -31  *   *            Right  Left                                             Right  Left                                            Right  Left                                            Right  Left                                        MCA = Middle Cerebral Artery      OPHT = Opthalmic Artery     BASILAR = Basilar Artery   ACA = Anterior Cerebral Artery     SIPH = Carotid Siphon PCA = Posterior Cerebral Artery   VERT = Verterbral Artery                   Normal MCA = 62+\-12 ACA = 50+\-12 PCA = 42+\-23   9/17 [place] ratio: right 4.4 left 4.6. je 9/19 - [place] ratio: right 3.4, left 6.0. (*) not insonated. RDS

## 2017-08-06 ENCOUNTER — Inpatient Hospital Stay (HOSPITAL_COMMUNITY): Payer: Medicaid Other

## 2017-08-06 DIAGNOSIS — K567 Ileus, unspecified: Secondary | ICD-10-CM

## 2017-08-06 DIAGNOSIS — J181 Lobar pneumonia, unspecified organism: Secondary | ICD-10-CM

## 2017-08-06 DIAGNOSIS — K9189 Other postprocedural complications and disorders of digestive system: Secondary | ICD-10-CM

## 2017-08-06 LAB — CBC
HCT: 26.5 % — ABNORMAL LOW (ref 36.0–46.0)
Hemoglobin: 8.4 g/dL — ABNORMAL LOW (ref 12.0–15.0)
MCH: 27.1 pg (ref 26.0–34.0)
MCHC: 31.7 g/dL (ref 30.0–36.0)
MCV: 85.5 fL (ref 78.0–100.0)
PLATELETS: 294 10*3/uL (ref 150–400)
RBC: 3.1 MIL/uL — AB (ref 3.87–5.11)
RDW: 18.5 % — ABNORMAL HIGH (ref 11.5–15.5)
WBC: 24.4 10*3/uL — AB (ref 4.0–10.5)

## 2017-08-06 LAB — BASIC METABOLIC PANEL
ANION GAP: 11 (ref 5–15)
ANION GAP: 12 (ref 5–15)
BUN: 13 mg/dL (ref 6–20)
BUN: 8 mg/dL (ref 6–20)
CHLORIDE: 101 mmol/L (ref 101–111)
CO2: 22 mmol/L (ref 22–32)
CO2: 23 mmol/L (ref 22–32)
Calcium: 9 mg/dL (ref 8.9–10.3)
Calcium: 9 mg/dL (ref 8.9–10.3)
Chloride: 112 mmol/L — ABNORMAL HIGH (ref 101–111)
Creatinine, Ser: 0.71 mg/dL (ref 0.44–1.00)
Creatinine, Ser: 0.68 mg/dL (ref 0.44–1.00)
GFR calc Af Amer: >60 mL/min (ref >60)
GLUCOSE: 123 mg/dL — AB (ref 65–99)
Glucose, Bld: 120 mg/dL — ABNORMAL HIGH (ref 65–99)
POTASSIUM: 2.6 mmol/L — AB (ref 3.5–5.1)
POTASSIUM: 2.8 mmol/L — AB (ref 3.5–5.1)
SODIUM: 136 mmol/L (ref 135–145)
Sodium: 145 mmol/L (ref 135–145)

## 2017-08-06 LAB — PROTEIN AND GLUCOSE, CSF
Glucose, CSF: 74 mg/dL — ABNORMAL HIGH (ref 40–70)
Total  Protein, CSF: 43 mg/dL (ref 15–45)

## 2017-08-06 LAB — GLUCOSE, CAPILLARY
GLUCOSE-CAPILLARY: 103 mg/dL — AB (ref 65–99)
GLUCOSE-CAPILLARY: 106 mg/dL — AB (ref 65–99)
GLUCOSE-CAPILLARY: 115 mg/dL — AB (ref 65–99)
GLUCOSE-CAPILLARY: 158 mg/dL — AB (ref 65–99)
Glucose-Capillary: 113 mg/dL — ABNORMAL HIGH (ref 65–99)
Glucose-Capillary: 122 mg/dL — ABNORMAL HIGH (ref 65–99)
Glucose-Capillary: 98 mg/dL (ref 65–99)

## 2017-08-06 LAB — CSF CELL COUNT WITH DIFFERENTIAL
LYMPHS CSF: 8 % — AB (ref 40–80)
Monocyte-Macrophage-Spinal Fluid: 4 % — ABNORMAL LOW (ref 15–45)
RBC Count, CSF: 14025 /mm3 — ABNORMAL HIGH
SEGMENTED NEUTROPHILS-CSF: 88 % — AB (ref 0–6)
WBC CSF: 45 /mm3 — AB (ref 0–5)

## 2017-08-06 LAB — PHOSPHORUS: Phosphorus: 2 mg/dL — ABNORMAL LOW (ref 2.5–4.6)

## 2017-08-06 LAB — MAGNESIUM: MAGNESIUM: 2.1 mg/dL (ref 1.7–2.4)

## 2017-08-06 MED ORDER — POTASSIUM PHOSPHATES 15 MMOLE/5ML IV SOLN
30.0000 mmol | Freq: Once | INTRAVENOUS | Status: AC
  Administered 2017-08-06: 30 mmol via INTRAVENOUS
  Filled 2017-08-06: qty 10

## 2017-08-06 MED ORDER — METOPROLOL TARTRATE 5 MG/5ML IV SOLN
2.5000 mg | INTRAVENOUS | Status: DC | PRN
  Administered 2017-08-06 – 2017-08-25 (×5): 5 mg via INTRAVENOUS
  Filled 2017-08-06 (×5): qty 5

## 2017-08-06 MED ORDER — POTASSIUM CHLORIDE 20 MEQ/15ML (10%) PO SOLN: 40.0000 meq | Freq: Once | ORAL | Status: DC

## 2017-08-06 MED ORDER — RANITIDINE HCL 150 MG/10ML PO SYRP
150.0000 mg | ORAL_SOLUTION | Freq: Two times a day (BID) | ORAL | Status: DC
  Administered 2017-08-06 – 2017-08-17 (×22): 150 mg via ORAL
  Filled 2017-08-06 (×24): qty 10

## 2017-08-06 MED ORDER — LABETALOL HCL 5 MG/ML IV SOLN
20.0000 mg | INTRAVENOUS | Status: DC | PRN
  Administered 2017-08-07 – 2017-08-26 (×8): 20 mg via INTRAVENOUS
  Filled 2017-08-06 (×9): qty 4

## 2017-08-06 MED ORDER — POTASSIUM CHLORIDE 10 MEQ/100ML IV SOLN
10.0000 meq | INTRAVENOUS | Status: AC
  Administered 2017-08-06 (×6): 10 meq via INTRAVENOUS
  Filled 2017-08-06 (×6): qty 100

## 2017-08-06 NOTE — Progress Notes (Signed)
SLP Cancellation Note  Patient Details Name: Palmer Shorey MRN: [id number] DOB: 19-Jan-1976   Cancelled treatment:       Reason Eval/Treat Not Completed: Medical issues which prohibited therapy.  D/W RN, if fever/diarrhea improved tomorrow, proceed with FEES.    Blenda Mounts Laurice 08/06/2017, 1:29 PM

## 2017-08-06 NOTE — Progress Notes (Signed)
PULMONARY / CRITICAL CARE MEDICINE   Name: Suzanne Brooks MRN: [id number] DOB: 08/26/1976    ADMISSION DATE:  07/30/2017 CONSULTATION DATE:  07/30/2017  REFERRING MD:  Conchita Paris  CHIEF COMPLAINT:  SAH  BRIEF HISTORY OF PRESENT ILLNESS:   41 y/o female with a history of hypertension and seizures presented on 9/13 with seizure, found to have a subarachnoid hemorrhage and intraparenchymal hemorrhage in left frontal lobe. She underwent left opthlmic aneurysm coiling, had an EVD placed on 9/14.   SUBJECTIVE:  Persistent fevers, back up to 102.2 this am. Still with liquid stools, CDiff neg.   Awake, NAD   VITAL SIGNS: BP (!) 171/124   Pulse (!) 117   Temp (!) 100.9 F (38.3 C) (Rectal)   Resp 20   Ht  (1.6 m)   Wt 66.8 kg (147 lb 4.3 oz)   LMP  (LMP Unknown) Comment: neg preg test 07/30/17  SpO2 99%   BMI 26.09 kg/m   HEMODYNAMICS:    VENTILATOR SETTINGS:    INTAKE / OUTPUT: I/O last 3 completed shifts: In: 5615.1 [I.V.:3842.6; NG/GT:1502.5; IV Piggyback:270] Out: 3645 [Urine:2950; Drains:395; Stool:300]  PHYSICAL EXAMINATION:  General:  Resting comfortably in bed, NAD, awake, no nuchal rigidity HENT: Ventricular drain clean, dry, no drainage OP clear PULM: resps even non labored on [place], Decreased BS at the bases, coughing and protecting her airway CV: RRR, Nl S1/S2, -M/R/G. GI: Soft, NT, ND and +BS MSK: WNL Neuro: Awakens to voice, opens R eye, tracks, follows some commands with R side, nods, non-verbal  LABS:  BMET  Recent Labs Lab 08/04/17 0201 08/05/17 0528 08/06/17 0225  NA 140 142 145  K 2.1* 2.3* 2.6*  CL 106 106 112*  CO2 BUN CREATININE 0.78 0.73 0.71  GLUCOSE 202* 188* 123*   Electrolytes  Recent Labs Lab 08/03/17 1625 08/04/17 0201 08/05/17 0528 08/06/17 0225  CALCIUM  --  8.9 8.9 9.0  MG 2.0 2.0 2.1 2.1  PHOS 2.9 1.9*  --  2.0*    CBC  Recent Labs Lab 08/04/17 0201 08/05/17 0528 08/06/17 0225  WBC  36.8* 30.5* 24.4*  HGB 9.6* 9.4* 8.4*  HCT 29.4* 29.4* 26.5*  PLT 246 269 294   Coag's  Recent Labs Lab 07/30/17 2039  APTT 37*  INR 1.07   Sepsis Markers  Recent Labs Lab 08/02/17 1016 08/02/17 1222 08/03/17 1014 08/04/17 0201 08/05/17 0528  LATICACIDVEN 2.2* 2.6*  --   --   --   PROCALCITON  --   --  0.44 0.23 0.21   ABG  Recent Labs Lab 07/30/17 1807 07/30/17 2245  PHART 7.355 7.391  PCO2ART 47.8 30.5*  PO2ART 397.0* 211*   Liver Enzymes No results for input(s): AST, ALT, ALKPHOS, BILITOT, ALBUMIN in the last 168 hours.  Cardiac Enzymes No results for input(s): TROPONINI, PROBNP in the last 168 hours.  Glucose  Recent Labs Lab 08/05/17 1128 08/05/17 1559 08/05/17 2048 08/05/17 2312 08/06/17 0323 08/06/17 0856  GLUCAP 179* 141* 164* 177* 103* 113*   Imaging Dg Chest Port 1 View  Result Date: 08/06/2017 CLINICAL DATA:  Pneumonia.  History of seizures.  Hypertension . EXAM: PORTABLE CHEST 1 VIEW COMPARISON:  Chest x-ray 08/05/2017, 08/04/2017.  KUB 08/03/2017 . FINDINGS: NG tube noted with its tip in the upper esophagus. Heart size normal. Persistent right base infiltrate. No interim change. No pleural effusion or pneumothorax. Distended loops of bowel are again noted. IMPRESSION: 1. NG tube noted  with its tip in the upper esophagus. Repositioning should be considered. 2.  Persistent right base infiltrate without interim change. 3.  Distended loops of bowel again noted. Critical Value/emergent results were called by telephone at the time of interpretation on 08/06/2017 at 8:07 am to nurse Herbert Seta, who verbally acknowledged these results. Electronically Signed   By: Maisie Fus  Register   On: 08/06/2017 08:11     STUDIES:  CT head 9/13 > Extensive subarachnoid hemorrhage throughout the basilar cisterns, sylvian fissure and interhemispheric fissure question ruptured aneurysm. Additional intra cerebral hematoma within the inferior LEFT frontal lobe 3.5 x 1.4 x 1.8  cm, un common to have intraparenchymal extension of subarachnoid hemorrhage, question concomitant or subsequent fall with traumatic hemorrhage? CT head 9/14> stable left frontal intraparenchymal hemorrhage and moderate SAH, embolization material stable, EVD in place  CULTURES: 9/16 blood >  9/16 urine > mult species, recollect>>> CDiff 9/19>>> neg  CSF 9/20>>>  ANTIBIOTICS: 9/16 vanc >   9/16 ceftaz >   SIGNIFICANT EVENTS: 9/13 admit  LINES/TUBES: Ett 9/13 > 9/15 9/14 ventricular drain >   DISCUSSION: 41 y/o female with a past medical history of hypertension and seizure here with a left frontoparenchymal hemorrage and SAH.  Fever 9/16.    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 No clear infiltrate on my review P:   Incentive spirometry Mobilize as able  HCAP coverage Monitor airway protection closely   CARDIOVASCULAR A:  Hypertension Sinus tach from sepsis, not in shock P:  SBP goal 180 per neurology Continue hydralazine and lopressor PRN labetalol  Off cleviprex    RENAL A:   Persistent hypokalemia P:   Monitor BMET and UOP Replace electrolytes as needed k-phos 9/20  GASTROINTESTINAL A:   No acute issues dysphagia P:   Tube feeding per nutrition Repeat swallow eval once acute issues improved (fever, HCAP)  If fails again will likely need PEG    HEMATOLOGIC A:   Anemia wihtout bleeding Leukocytosis> stress response? P:  Monitor for bleeding  INFECTIOUS A:   Sepsis, RLL HCAP? Fever  P:   ID following  CSF sampled this am - follow cultures  CDiff  neg  Resend urine culture  Goal 8 days abx per ID  Trend pct  Could be neuro fever    ENDOCRINE A:   No acute issues   P:   Monitor glucose  FAMILY  - Updates: no family available 9/20.  Leave in ICU for now .   - Inter-disciplinary family meet or Palliative Care meeting due by:  day 7  Dirk Dress, NP 08/06/2017  10:09 AM Pager: [phone] or [phone]  Attending Note:  41 year old female s/p SAH and drain placement who was extubated but mental status remains poor and on exam, is able to cough but I remain concerned for airway protection.  I reviewed CXR myself, RLL infiltrate noted.  Patient remains febrile.  Discussed with ID, drain tapped this AM and cultures are pending.  ID changed abx duration.  Hold in ICU while drain is needed.  Monitor closely for airway protection. Hold of PEG for now.  PCCM will  follow.  The patient is critically ill with multiple organ systems failure and requires high complexity decision making for assessment and support, frequent evaluation and titration of therapies, application of advanced monitoring technologies and extensive interpretation of multiple databases.   Critical Care Time devoted to patient care services described in this note is  35  Minutes. This time reflects time of care of this signee Dr Koren Bound. This critical care time does not reflect procedure time, or teaching time or supervisory time of PA/NP/Med student/Med Resident etc but could involve care discussion time.  Alyson Reedy, M.D. [facility] Pulmonary/Critical Care Medicine. Pager: [phone]. After hours pager: [phone].

## 2017-08-06 NOTE — Progress Notes (Signed)
OT Cancellation Note  Patient Details Name: Suzanne Brooks MRN: [id number] DOB: 07/19/1976   Cancelled Treatment:    Reason Eval/Treat Not Completed: Other (comment). Discussed with nsg. Nsg asked if therapy could wait until this pm due to pt currently on cooling blanket due to fever, waiting to place flexiseal due to increased amounts of diarrhea and K+ at 2.6. Will attempt to see this pm if schedule allows.  [facility] Vicky Mccanless, OT/L  [phone] 08/06/2017 08/06/2017, 10:37 AM

## 2017-08-06 NOTE — Progress Notes (Signed)
INFECTIOUS DISEASE PROGRESS NOTE  ID: Suzanne Brooks is a 41 y.o. female with  Active Problems:   ICH (intracerebral hemorrhage) (HCC)   Subarachnoid hemorrhage (HCC)   Seizure-like activity (HCC)   Acute respiratory failure with hypoxia (HCC)  Subjective: Awakens and interacts.   Abtx:  Anti-infectives    Start     Dose/Rate Route Frequency Ordered Stop   08/06/17 0615  vancomycin (VANCOCIN) IVPB 750 mg/150 ml premix     750 mg 150 mL/hr over 60 Minutes Intravenous Every 8 hours 08/05/17 2217     08/02/17 2130  vancomycin (VANCOCIN) IVPB 750 mg/150 ml premix  Status:  Discontinued     750 mg 150 mL/hr over 60 Minutes Intravenous Every 12 hours 08/02/17 0902 08/05/17 2217   08/02/17 0930  vancomycin (VANCOCIN) IVPB 1000 mg/200 mL premix     1,000 mg 200 mL/hr over 60 Minutes Intravenous  Once 08/02/17 0902 08/02/17 1116   08/02/17 0930  cefTAZidime (FORTAZ) 2 g in dextrose 5 % 50 mL IVPB     2 g 100 mL/hr over 30 Minutes Intravenous Every 8 hours 08/02/17 0902        Medications:  Scheduled: .  stroke: mapping our early stages of recovery book   Does not apply Once  . chlorhexidine  15 mL Mouth Rinse BID  . docusate  100 mg Per Tube BID  . feeding supplement (PRO-STAT SUGAR FREE 64)  30 mL Per Tube Daily  . multivitamin  15 mL Per Tube Daily  . niMODipine  60 mg Oral Q4H   Or  . NiMODipine  60 mg Per Tube Q4H  . pantoprazole  40 mg Oral Daily   Or  . pantoprazole sodium  40 mg Per Tube Daily    Objective: Vital signs in last 24 hours: Temp:  [99.1 F (37.3 C)-101.9 F (38.8 C)] 100.9 F (38.3 C) (09/20 0755) Pulse Rate:  [94-132] 117 (09/20 0900) Resp:  [16-27] 20 (09/20 0900) BP: (123-202)/(82-125) 171/124 (09/20 0900) SpO2:  [95 %-100 %] 99 % (09/20 0900) Weight:  [66.8 kg (147 lb 4.3 oz)] 66.8 kg (147 lb 4.3 oz) (09/20 0500)   General appearance: alert and no distress Head: drain in place Resp: rhonchi anterior - bilateral Cardio: tachycardia GI:  abnormal findings:  distended and hypoactive bowel sounds  Lab Results  Recent Labs  08/05/17 0528 08/06/17 0225  WBC 30.5* 24.4*  HGB 9.4* 8.4*  HCT 29.4* 26.5*  NA 142 145  K 2.3* 2.6*  CL 106 112*  CO2 23 22  BUN 10 13  CREATININE 0.73 0.71   Liver Panel No results for input(s): PROT, ALBUMIN, AST, ALT, ALKPHOS, BILITOT, BILIDIR, IBILI in the last 72 hours. Sedimentation Rate No results for input(s): ESRSEDRATE in the last 72 hours. C-Reactive Protein No results for input(s): CRP in the last 72 hours.  Microbiology: Recent Results (from the past 240 hour(s))  MRSA PCR Screening     Status: None   Collection Time: 07/30/17  8:30 PM  Result Value Ref Range Status   MRSA by PCR NEGATIVE NEGATIVE Final    Comment:        The GeneXpert MRSA Assay (FDA approved for NASAL specimens only), is one component of a comprehensive MRSA colonization surveillance program. It is not intended to diagnose MRSA infection nor to guide or monitor treatment for MRSA infections.   Culture, blood (Routine X 2) w Reflex to ID Panel     Status: None (Preliminary result)  Collection Time: 08/02/17 10:30 AM  Result Value Ref Range Status   Specimen Description BLOOD RIGHT HAND  Final   Special Requests   Final    BOTTLES DRAWN AEROBIC ONLY Blood Culture results may not be optimal due to an inadequate volume of blood received in culture bottles   Culture NO GROWTH 3 DAYS  Final   Report Status PENDING  Incomplete  Culture, blood (Routine X 2) w Reflex to ID Panel     Status: None (Preliminary result)   Collection Time: 08/02/17 10:35 AM  Result Value Ref Range Status   Specimen Description BLOOD RIGHT HAND  Final   Special Requests   Final    BOTTLES DRAWN AEROBIC ONLY Blood Culture adequate volume   Culture NO GROWTH 3 DAYS  Final   Report Status PENDING  Incomplete  Culture, Urine     Status: Abnormal   Collection Time: 08/02/17 12:26 PM  Result Value Ref Range Status   Specimen  Description URINE, CATHETERIZED  Final   Special Requests NONE  Final   Culture MULTIPLE SPECIES PRESENT, SUGGEST RECOLLECTION (A)  Final   Report Status 08/03/2017 FINAL  Final  C difficile quick scan w PCR reflex     Status: None   Collection Time: 08/05/17  5:11 PM  Result Value Ref Range Status   C Diff antigen NEGATIVE NEGATIVE Final   C Diff toxin NEGATIVE NEGATIVE Final   C Diff interpretation No C. difficile detected.  Final  CSF culture with Stat gram stain     Status: None (Preliminary result)   Collection Time: 08/06/17  8:45 AM  Result Value Ref Range Status   Specimen Description CSF  Final   Special Requests RED TOP TUBE SENT  Final   Gram Stain   Final    CYTOSPIN SMEAR WBC PRESENT,BOTH PMN AND MONONUCLEAR NO ORGANISMS SEEN    Culture PENDING  Incomplete   Report Status PENDING  Incomplete    Studies/Results: Dg Chest Port 1 View  Result Date: 08/06/2017 CLINICAL DATA:  Pneumonia.  History of seizures.  Hypertension . EXAM: PORTABLE CHEST 1 VIEW COMPARISON:  Chest x-ray 08/05/2017, 08/04/2017.  KUB 08/03/2017 . FINDINGS: NG tube noted with its tip in the upper esophagus. Heart size normal. Persistent right base infiltrate. No interim change. No pleural effusion or pneumothorax. Distended loops of bowel are again noted. IMPRESSION: 1. NG tube noted with its tip in the upper esophagus. Repositioning should be considered. 2.  Persistent right base infiltrate without interim change. 3.  Distended loops of bowel again noted. Critical Value/emergent results were called by telephone at the time of interpretation on 08/06/2017 at 8:07 am to nurse Herbert Seta, who verbally acknowledged these results. Electronically Signed   By: Maisie Fus  Register   On: 08/06/2017 08:11   Dg Chest Port 1 View  Result Date: 08/05/2017 CLINICAL DATA:  Respiratory failure, pneumonia EXAM: PORTABLE CHEST 1 VIEW COMPARISON:  08/04/2017 FINDINGS: Consolidation in the right lower lobe is similar to prior study.  Cardiomegaly. No confluent opacity on the left. No effusions. NG tube is in the stomach. IMPRESSION: Continued right lower lobe airspace disease.  No change. Electronically Signed   By: Charlett Nose M.D.   On: 08/05/2017 08:38   Dg Abd Portable 1v  Result Date: 08/06/2017 CLINICAL DATA:  Encounter for NG tube placement EXAM: PORTABLE ABDOMEN - 1 VIEW COMPARISON:  08/03/2017 FINDINGS: Severe diffuse gaseous distention of bowel, most pronounced within the colon compatible with ileus. NG tube is  in place with the tip in the midportion of the stomach. No free air organomegaly. IMPRESSION: NG tube tip in the mid stomach. Continued marked gaseous distention of bowel, compatible with ileus. Electronically Signed   By: Charlett Nose M.D.   On: 08/06/2017 10:18     Assessment/Plan: Leukocytosis, Fever RLL pna ICH Ileus  C diff (-) Await CSF Cx (CSF prot not markedly elevated, Glc not depressed) No change anbx (treating as if HCAP) Can stop vanco when CSF Cx is negative.  Continued fever, WBC better...  Total days of antibiotics: 3 vanco/ceftaz         Johny Sax Infectious Diseases (pager) [phone] [email or web address] 08/06/2017, 11:11 AM  LOS: 7 days

## 2017-08-06 NOTE — Progress Notes (Signed)
eLink Physician-Brief Progress Note Patient Name: Suzanne Brooks DOB: 23-Nov-1975 MRN: [id number]   Date of Service  08/06/2017  HPI/Events of Note  Last k 2.6 but has rec'd today with ongoing gi losses (freq stools per nursing)  eICU Interventions  D/c colace/ ppi and use zantac instead Recheck bmet before more K rx      Intervention Category Major Interventions: Electrolyte abnormality - evaluation and management  Sandrea Hughs 08/06/2017, 9:09 PM

## 2017-08-06 NOTE — Progress Notes (Signed)
PT Cancellation Note  Patient Details Name: Suzanne Brooks MRN: [id number] DOB: Aug 03, 1976   Cancelled Treatment:    Reason Eval/Treat Not Completed: Medical issues which prohibited therapy per RN, holding PT this AM. Will follow up.   [name] [name] 08/06/2017, 10:36 AM Mylo Red, PT, DPT [phone]

## 2017-08-07 ENCOUNTER — Encounter (HOSPITAL_COMMUNITY): Payer: Self-pay

## 2017-08-07 ENCOUNTER — Inpatient Hospital Stay (HOSPITAL_BASED_OUTPATIENT_CLINIC_OR_DEPARTMENT_OTHER): Payer: Medicaid Other

## 2017-08-07 DIAGNOSIS — I609 Nontraumatic subarachnoid hemorrhage, unspecified: Secondary | ICD-10-CM

## 2017-08-07 LAB — GLUCOSE, CAPILLARY
GLUCOSE-CAPILLARY: 106 mg/dL — AB (ref 65–99)
GLUCOSE-CAPILLARY: 108 mg/dL — AB (ref 65–99)
GLUCOSE-CAPILLARY: 129 mg/dL — AB (ref 65–99)
GLUCOSE-CAPILLARY: 134 mg/dL — AB (ref 65–99)
Glucose-Capillary: 115 mg/dL — ABNORMAL HIGH (ref 65–99)
Glucose-Capillary: 126 mg/dL — ABNORMAL HIGH (ref 65–99)

## 2017-08-07 LAB — CULTURE, BLOOD (ROUTINE X 2)
CULTURE: NO GROWTH
Culture: NO GROWTH
SPECIAL REQUESTS: ADEQUATE

## 2017-08-07 LAB — CBC
HEMATOCRIT: 25.9 % — AB (ref 36.0–46.0)
HEMOGLOBIN: 8.1 g/dL — AB (ref 12.0–15.0)
MCH: 26.6 pg (ref 26.0–34.0)
MCHC: 31.3 g/dL (ref 30.0–36.0)
MCV: 85.2 fL (ref 78.0–100.0)
Platelets: 344 10*3/uL (ref 150–400)
RBC: 3.04 MIL/uL — AB (ref 3.87–5.11)
RDW: 18.8 % — ABNORMAL HIGH (ref 11.5–15.5)
WBC: 20.5 10*3/uL — ABNORMAL HIGH (ref 4.0–10.5)

## 2017-08-07 LAB — BASIC METABOLIC PANEL
Anion gap: 10 (ref 5–15)
BUN: 11 mg/dL (ref 6–20)
CHLORIDE: 102 mmol/L (ref 101–111)
CO2: 26 mmol/L (ref 22–32)
Calcium: 9 mg/dL (ref 8.9–10.3)
Creatinine, Ser: 0.67 mg/dL (ref 0.44–1.00)
GFR calc non Af Amer: >60 mL/min (ref >60)
Glucose, Bld: 145 mg/dL — ABNORMAL HIGH (ref 65–99)
POTASSIUM: 2.8 mmol/L — AB (ref 3.5–5.1)
SODIUM: 138 mmol/L (ref 135–145)

## 2017-08-07 LAB — URINE CULTURE: Culture: NO GROWTH

## 2017-08-07 LAB — MAGNESIUM: MAGNESIUM: 2 mg/dL (ref 1.7–2.4)

## 2017-08-07 LAB — PHOSPHORUS: PHOSPHORUS: 3.5 mg/dL (ref 2.5–4.6)

## 2017-08-07 MED ORDER — MAGNESIUM SULFATE 4 GM/100ML IV SOLN
4.0000 g | Freq: Once | INTRAVENOUS | Status: AC
  Administered 2017-08-07: 4 g via INTRAVENOUS
  Filled 2017-08-07: qty 100

## 2017-08-07 MED ORDER — POTASSIUM CHLORIDE 20 MEQ PO PACK
40.0000 meq | PACK | Freq: Four times a day (QID) | ORAL | Status: AC
  Administered 2017-08-07 – 2017-08-08 (×3): 40 meq
  Filled 2017-08-07 (×3): qty 2

## 2017-08-07 MED ORDER — POTASSIUM PHOSPHATES 15 MMOLE/5ML IV SOLN
30.0000 meq | Freq: Once | INTRAVENOUS | Status: AC
  Administered 2017-08-07: 30 meq via INTRAVENOUS
  Filled 2017-08-07: qty 6.82

## 2017-08-07 NOTE — Progress Notes (Signed)
Transcranial Doppler  Date POD PCO2 HCT BP  MCA ACA PCA OPHT SIPH VERT Basilar  9/14 vs     Right  Left   49  44   -14  -44   21  37   -30  -35     -37    9/17 je     Right  Left   79  143   -45  -67   12  32   31  19   109  42   -60  -37   -54      9/19rs     Right  Left   164  181   -38  -71   48  66   29  26   59  46   -31  *   *      9/21rs      Right  Left   113  166   -35  -98   40  49  45   -42  -38   -90            Right  Left                                            Right  Left                                            Right  Left                                        MCA = Middle Cerebral Artery      OPHT = Opthalmic Artery     BASILAR = Basilar Artery   ACA = Anterior Cerebral Artery     SIPH = Carotid Siphon PCA = Posterior Cerebral Artery   VERT = Verterbral Artery                   Normal MCA = 62+\-12 ACA = 50+\-12 PCA = 42+\-23   9/17 [place] ratio: right 4.4 left 4.6. je 9/19 - [place] ratio: right 3.4, left 6.0. (*) not insonated. RDS 9/21 - [place]  Ratio: right 3.8, left 7.2 - rds

## 2017-08-07 NOTE — Progress Notes (Signed)
No issues overnight.   EXAM:  BP (!) 143/97   Pulse (!) 105   Temp 99.4 F (37.4 C) (Oral)   Resp 18   Ht  (1.6 m)   Wt 68 kg (149 lb 14.6 oz)   LMP  (LMP Unknown) Comment: neg preg test 07/30/17  SpO2 100%   BMI 26.56 kg/m   Arousable, opens eyes Answers simple questions with single word answers FC, good strength BUE, >antigravity LLE, no spont movement RLE EVD in place, functional  IMPRESSION:  41 y.o. female SAH d# 9 s/p coiling left ophthalmic aneurysm, stable. RLE weakness I suspect related to the hematoma, not spasm as she had the weakness prior to elevation of TCD velocities.  PLAN: - Cont supportive care - SBP goal 180-258mmHg

## 2017-08-07 NOTE — Progress Notes (Signed)
PULMONARY / CRITICAL CARE MEDICINE   Name: Suzanne Brooks MRN: [id number] DOB: [birth year]    ADMISSION DATE:  07/30/2017 CONSULTATION DATE:  07/30/2017  REFERRING MD:  Conchita Paris  CHIEF COMPLAINT:  SAH  BRIEF HISTORY OF PRESENT ILLNESS:   41 y/o female with a history of hypertension and seizures presented on 9/13 with seizure, found to have a subarachnoid hemorrhage and intraparenchymal hemorrhage in left frontal lobe. She underwent left opthlmic aneurysm coiling, had an EVD placed on 9/14.   SUBJECTIVE:  Persistent fevers, back up to 102.2 this am. Still with liquid stools, CDiff neg.   Awake, NAD   VITAL SIGNS: BP (!) 161/111   Pulse (!) 105   Temp 99.4 F (37.4 C) (Oral)   Resp 15   Ht  (1.6 m)   Wt 68 kg (149 lb 14.6 oz)   LMP  (LMP Unknown) Comment: neg preg test 07/30/17  SpO2 100%   BMI 26.56 kg/m   HEMODYNAMICS:    VENTILATOR SETTINGS:    INTAKE / OUTPUT: I/O last 3 completed shifts: In: 6271.1 [I.V.:3728.6; NG/GT:302.5; IV Piggyback:2240] Out: 6842 [Urine:5000; Drains:442; Stool:1400]  PHYSICAL EXAMINATION:  General:  Resting comfortably in bed, NAD, awake, no nuchal rigidity HENT: Ventricular drain clean, dry, no drainage OP clear PULM: resps even non labored on [place], Decreased BS at the bases, coughing and protecting her airway CV: RRR, Nl S1/S2, -M/R/G. GI: Soft, NT, ND and +BS MSK: WNL Neuro: Awakens to voice, opens R eye, tracks, follows some commands with R side, nods, non-verbal  LABS:  BMET  Recent Labs Lab 08/05/17 0528 08/06/17 0225 08/06/17 2235  NA 142 145 136  K 2.3* 2.6* 2.8*  CL 106 112* 101  CO2 BUN CREATININE 0.73 0.71 0.68  GLUCOSE 188* 123* 120*   Electrolytes  Recent Labs Lab 08/03/17 1625 08/04/17 0201 08/05/17 0528 08/06/17 0225 08/06/17 2235  CALCIUM  --  8.9 8.9 9.0 9.0  MG 2.0 2.0 2.1 2.1  --   PHOS 2.9 1.9*  --  2.0*  --    CBC  Recent Labs Lab 08/04/17 0201 08/05/17 0528  08/06/17 0225  WBC 36.8* 30.5* 24.4*  HGB 9.6* 9.4* 8.4*  HCT 29.4* 29.4* 26.5*  PLT 246 269 294   Coag's No results for input(s): APTT, INR in the last 168 hours. Sepsis Markers  Recent Labs Lab 08/02/17 1016 08/02/17 1222 08/03/17 1014 08/04/17 0201 08/05/17 0528  LATICACIDVEN 2.2* 2.6*  --   --   --   PROCALCITON  --   --  0.44 0.23 0.21   ABG No results for input(s): PHART, PCO2ART, PO2ART in the last 168 hours. Liver Enzymes No results for input(s): AST, ALT, ALKPHOS, BILITOT, ALBUMIN in the last 168 hours.  Cardiac Enzymes No results for input(s): TROPONINI, PROBNP in the last 168 hours.  Glucose  Recent Labs Lab 08/06/17 1236 08/06/17 1731 08/06/17 2006 08/06/17 2336 08/07/17 0403 08/07/17 0734  GLUCAP 122* 106* 98 115* 115* 108*   Imaging Dg Abd Portable 1v  Result Date: 08/06/2017 CLINICAL DATA:  Right lower quadrant pain.  Ileus. EXAM: PORTABLE ABDOMEN - 1 VIEW COMPARISON:  Prior today FINDINGS: Nasogastric tube is again seen with tip now in the gastric fundus. Diffuse gaseous distention of the colon is again seen with lesser degree of small-bowel distention. This shows mild decrease since previous study, and is likely due to ileus. IMPRESSION: Probable ileus, with mild decrease in bowel dilatation since prior  study. Nasogastric tube tip overlies gastric fundus. Electronically Signed   By: Myles Rosenthal M.D.   On: 08/06/2017 18:37   Dg Abd Portable 1v  Result Date: 08/06/2017 CLINICAL DATA:  Encounter for NG tube placement EXAM: PORTABLE ABDOMEN - 1 VIEW COMPARISON:  08/03/2017 FINDINGS: Severe diffuse gaseous distention of bowel, most pronounced within the colon compatible with ileus. NG tube is in place with the tip in the midportion of the stomach. No free air organomegaly. IMPRESSION: NG tube tip in the mid stomach. Continued marked gaseous distention of bowel, compatible with ileus. Electronically Signed   By: Charlett Nose M.D.   On: 08/06/2017 10:18    STUDIES:  CT head 9/13 > Extensive subarachnoid hemorrhage throughout the basilar cisterns, sylvian fissure and interhemispheric fissure question ruptured aneurysm. Additional intra cerebral hematoma within the inferior LEFT frontal lobe 3.5 x 1.4 x 1.8 cm, un common to have intraparenchymal extension of subarachnoid hemorrhage, question concomitant or subsequent fall with traumatic hemorrhage? CT head 9/14> stable left frontal intraparenchymal hemorrhage and moderate SAH, embolization material stable, EVD in place  CULTURES: 9/16 blood > NTD 9/16 urine > mult species, recollect>>>NTD CDiff 9/19>>> neg  CSF 9/20>>>NTD  ANTIBIOTICS: 9/16 vanc >   9/16 ceftaz >   SIGNIFICANT EVENTS: 9/13 admit  LINES/TUBES: Ett 9/13 > 9/15 9/14 ventricular drain >   DISCUSSION: 41 y/o female with a past medical history of hypertension and seizure here with a left frontoparenchymal hemorrage and SAH.  Fever 9/16.  Remains febrile.  ASSESSMENT / PLAN:  NEUROLOGIC A:   Seizure history, unknown if on anti-epileptics SAH Left frontal intraparenchymal hemorrhage S/p L opth artery coiling P:   Per neurosurgery Continue keppra Continue nimodipine IVD per neurosurgery - CSF sample sent 9/20 from EVD NTD  PULMONARY A: Acute respiratory failure with hypoxemia secondary to inabilty to protect airway > resolved 9/15 CXR severely rotated, poor study > RLL pneumonia? No clear infiltrate on my review P:   Incentive spirometry Mobilize as able  HCAP coverage Mental status waxing and waining, monitor closely in the ICU for airway protection SLP to be redone  CARDIOVASCULAR A:  Hypertension Sinus tach from sepsis, not in shock P:  SBP goal 180 per neurology Continue hydralazine and lopressor PRN labetalol  Off cleviprex   RENAL A:   Persistent hypokalemia, no labs this AM P:   Monitor BMET and UOP Replace electrolytes as needed Repeat all labs today (not  drawn)  GASTROINTESTINAL A:   No acute issues dysphagia P:   Tube feeding per nutrition Repeat swallow eval once acute issues improved (fever, HCAP)  SLP ordered  HEMATOLOGIC A:   Anemia wihtout bleeding Leukocytosis> stress response? P:  Monitor for bleeding  INFECTIOUS A:   Sepsis, RLL HCAP? Fever  P:   ID following  CSF sampled and negative after 24 hours thus far CDiff neg  Resend urine culture  Abx per ID at this point Trend pct 0.21 Could be neuro fever   ENDOCRINE A:   No acute issues   P:   Monitor glucose  FAMILY  - Updates: no family available 9/21.  Leave in ICU given IVD and neurostatus.   - Inter-disciplinary family meet or Palliative Care meeting due by:  day 7  The patient is critically ill with multiple organ systems failure and requires high complexity decision making for assessment and support, frequent evaluation and titration of therapies, application of advanced monitoring technologies and extensive interpretation of multiple databases.   Critical Care  Time devoted to patient care services described in this note is  35  Minutes. This time reflects time of care of this signee Dr Jennet Maduro. This critical care time does not reflect procedure time, or teaching time or supervisory time of PA/NP/Med student/Med Resident etc but could involve care discussion time.  Rush Farmer, M.D. [facility] Pulmonary/Critical Care Medicine. Pager: [phone]. After hours pager: [phone].

## 2017-08-07 NOTE — Progress Notes (Signed)
SLP Cancellation Note  Patient Details Name: Suzanne Brooks MRN: [id number] DOB: 11/18/1975   Cancelled treatment:       Reason Eval/Treat Not Completed: Medical issues which prohibited therapy. Pt still with NGT to suction for ileus. SLP will follow for readiness to complete FEES.   Maxcine Ham 08/07/2017, 8:55 AM  Maxcine Ham, M.A. CCC-SLP [phone]

## 2017-08-07 NOTE — Progress Notes (Signed)
Verbal order to restart tube feed. Speech updated for possible fees study. Payslee Bateson, [place]

## 2017-08-07 NOTE — Procedures (Signed)
Objective Swallowing Evaluation: Type of Study: FEES-Fiberoptic Endoscopic Evaluation of Swallow  Patient Details  Name: Suzanne Brooks MRN: [id number] Date of Birth: October 19, 1976  Today's Date: 08/07/2017 Time: SLP Start Time (ACUTE ONLY): 1327-SLP Stop Time (ACUTE ONLY): 1355 SLP Time Calculation (min) (ACUTE ONLY): 28 min  Past Medical History:  Past Medical History:  Diagnosis Date  . Hypertension   . Seizures (HCC)    Past Surgical History:  Past Surgical History:  Procedure Laterality Date  . RADIOLOGY WITH ANESTHESIA N/A 07/30/2017   Procedure: RADIOLOGY WITH ANESTHESIA/ DR. Gerlene Burdock;  Surgeon: Radiologist, Medication, MD;  Location: [facility] OR;  Service: Radiology;  Laterality: N/A;   HPI: Pt is a 41 y/o female who presents with seizure activity. 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 ETT 9/13-9/15.   Subjective: pt alert, somewhat echolalic, singing   Assessment / Plan / Recommendation  CHL IP CLINICAL IMPRESSIONS 08/07/2017  Clinical Impression Pt has a significant oropharyngeal dysphagia although with view limited by curved epiglottis, presence of NGT, and copious secretions that are diffusely throughout the pharynx/larynx upon initial entry with the scope. Limited mobility is noted from her R true vocal fold, and although her L side moves well, it does not appear to have full approximation. This contributes to weak cough and reduced airway closure. Minimal amounts of ice, nectar thick liquids, and purees were provided, with sluggish swallow and diffuse pharyngeal weakness resulting in significant residue throughout the pharynx. Silent penetration is observed with ice and nectar thick liquids with aspiration risk high, although visualization of the glottis and the anterior commisure is difficult. Despite POs offered and cues for coughing/re-swallowing, neither her residue nor her standing secretions can be  fully cleared. Pt is not yet ready for an oral diet; would continue ice chips with SLP and initiation of pharyngeal strengthening exercises to facilitate advancement to oral diet, which is likely with additional time for strength training and improved cognitive function.  SLP Visit Diagnosis Dysphagia, oropharyngeal phase (R13.12)  Attention and concentration deficit following --  Frontal lobe and executive function deficit following --  Impact on safety and function Severe aspiration risk      CHL IP TREATMENT RECOMMENDATION 08/07/2017  Treatment Recommendations Therapy as outlined in treatment plan below     Prognosis 08/07/2017  Prognosis for Safe Diet Advancement Good  Barriers to Reach Goals Cognitive deficits  Barriers/Prognosis Comment --    CHL IP DIET RECOMMENDATION 08/07/2017  SLP Diet Recommendations NPO;Alternative means - temporary  Liquid Administration via --  Medication Administration Via alternative means  Compensations --  Postural Changes --      CHL IP OTHER RECOMMENDATIONS 08/07/2017  Recommended Consults --  Oral Care Recommendations Oral care QID  Other Recommendations Remove water pitcher;Have oral suction available      CHL IP FOLLOW UP RECOMMENDATIONS 08/07/2017  Follow up Recommendations Inpatient Rehab      CHL IP FREQUENCY AND DURATION 08/07/2017  Speech Therapy Frequency (ACUTE ONLY) min 2x/week  Treatment Duration 2 weeks           CHL IP ORAL PHASE 08/07/2017  Oral Phase Impaired  Oral - Pudding Teaspoon --  Oral - Pudding Cup --  Oral - Honey Teaspoon --  Oral - Honey Cup --  Oral - Nectar Teaspoon Delayed oral transit;Decreased bolus cohesion;Premature spillage  Oral - Nectar Cup --  Oral - Nectar Straw --  Oral - Thin Teaspoon Delayed  oral transit;Decreased bolus cohesion;Premature spillage  Oral - Thin Cup --  Oral - Thin Straw --  Oral - Puree Delayed oral transit;Decreased bolus cohesion;Premature spillage  Oral - Mech Soft --   Oral - Regular --  Oral - Multi-Consistency --  Oral - Pill --  Oral Phase - Comment --    CHL IP PHARYNGEAL PHASE 08/07/2017  Pharyngeal Phase Impaired  Pharyngeal- Pudding Teaspoon --  Pharyngeal --  Pharyngeal- Pudding Cup --  Pharyngeal --  Pharyngeal- Honey Teaspoon --  Pharyngeal --  Pharyngeal- Honey Cup --  Pharyngeal --  Pharyngeal- Nectar Teaspoon Delayed swallow initiation-pyriform sinuses;Reduced pharyngeal peristalsis;Reduced epiglottic inversion;Reduced anterior laryngeal mobility;Reduced laryngeal elevation;Reduced airway/laryngeal closure;Reduced tongue base retraction;Penetration/Aspiration before swallow;Penetration/Aspiration during swallow;Pharyngeal residue - valleculae;Pharyngeal residue - pyriform;Pharyngeal residue - posterior pharnyx;Inter-arytenoid space residue;Lateral channel residue  Pharyngeal Material enters airway, remains ABOVE vocal cords and not ejected out  Pharyngeal- Nectar Cup --  Pharyngeal --  Pharyngeal- Nectar Straw --  Pharyngeal --  Pharyngeal- Thin Teaspoon Delayed swallow initiation-pyriform sinuses;Reduced pharyngeal peristalsis;Reduced epiglottic inversion;Reduced anterior laryngeal mobility;Reduced laryngeal elevation;Reduced airway/laryngeal closure;Reduced tongue base retraction;Penetration/Aspiration before swallow;Penetration/Aspiration during swallow;Pharyngeal residue - valleculae;Pharyngeal residue - pyriform;Pharyngeal residue - posterior pharnyx;Inter-arytenoid space residue;Lateral channel residue  Pharyngeal Material enters airway, remains ABOVE vocal cords and not ejected out  Pharyngeal- Thin Cup --  Pharyngeal --  Pharyngeal- Thin Straw --  Pharyngeal --  Pharyngeal- Puree Delayed swallow initiation-pyriform sinuses;Reduced pharyngeal peristalsis;Reduced epiglottic inversion;Reduced anterior laryngeal mobility;Reduced laryngeal elevation;Reduced airway/laryngeal closure;Reduced tongue base retraction;Pharyngeal residue -  valleculae;Pharyngeal residue - pyriform;Pharyngeal residue - posterior pharnyx;Inter-arytenoid space residue;Lateral channel residue  Pharyngeal Other (Comment)  Pharyngeal- Mechanical Soft --  Pharyngeal --  Pharyngeal- Regular --  Pharyngeal --  Pharyngeal- Multi-consistency --  Pharyngeal --  Pharyngeal- Pill --  Pharyngeal --  Pharyngeal Comment --     CHL IP CERVICAL ESOPHAGEAL PHASE 08/07/2017  Cervical Esophageal Phase (No Data)  Pudding Teaspoon --  Pudding Cup --  Honey Teaspoon --  Honey Cup --  Nectar Teaspoon --  Nectar Cup --  Nectar Straw --  Thin Teaspoon --  Thin Cup --  Thin Straw --  Puree --  Mechanical Soft --  Regular --  Multi-consistency --  Pill --  Cervical Esophageal Comment --    No flowsheet data found.  Maxcine Ham 08/07/2017, 3:58 PM   Maxcine Ham, M.A. CCC-SLP [phone]

## 2017-08-07 NOTE — Progress Notes (Signed)
PT Cancellation Note  Patient Details Name: Suzanne Brooks MRN: [id number] DOB: 10/02/1976   Cancelled Treatment:    Reason Eval/Treat Not Completed: Patient at procedure or test/unavailable. Patient currently with SLP for FEES.  Yemaya Barnier SPT Kerah Hardebeck 08/07/2017, 1:31 PM

## 2017-08-07 NOTE — Progress Notes (Signed)
OT Cancellation Note  Patient Details Name: Suzanne Brooks MRN: [id number] DOB: Feb 22, 1976   Cancelled Treatment:    Reason Eval/Treat Not Completed: Patient at procedure or test/ unavailable  [facility] Zhanae Proffit, OT/L  [phone] 08/07/2017 08/07/2017, 2:27 PM

## 2017-08-07 NOTE — Progress Notes (Signed)
No issues overnight.   EXAM:  BP (!) 150/99   Pulse (!) 109   Temp 99.4 F (37.4 C) (Oral)   Resp 14   Ht  (1.6 m)   Wt 68 kg (149 lb 14.6 oz)   LMP  (LMP Unknown) Comment: neg preg test 07/30/17  SpO2 100%   BMI 26.56 kg/m   Easily arousable Says name Follows commands, good strength BUE/LLE No voluntary movement RLE EVD in place, bloody csf draining  TCD reviewed, increased bilateral MCA velocities  IMPRESSION:  41 y.o. female SAH d# 8 s/p coiling left ophthalmic aneurysm. Remains neurologically stable, TCD velocities increasing.  Febrile  PLAN: - CSF sample obtained for routine studies - Will d/c cleviprex, goal SBP 180-279mmHg - Cont EVD drainage

## 2017-08-07 NOTE — Progress Notes (Signed)
Nutrition Follow-up  DOCUMENTATION CODES:   Not applicable  INTERVENTION:  Continue Osmolite 1.5 @ 50 mL/hr (1200 mL daily) Continue 30 mL Prostat po once a day Tube feeding and Prostat regimen provides 1900 kcal, 90 grams of protein, and 914 ml of H2O.   NUTRITION DIAGNOSIS:   Inadequate oral intake related to inability to eat as evidenced by NPO status.  Ongoing   GOAL:   Patient will meet greater than or equal to 90% of their needs  Met via TF regimen  MONITOR:   TF tolerance, Weight trends, Labs  ASSESSMENT:   41 year old female who presented to ED with seizures found to be hypertensive with aneurysmal SAH, ICH. Coiled in IR 9/13. Post-op to ICU on vent.   Pt was intubated 09/13, extubated and swallow evaluation- SLP recommended NPO 09/15, NGT placed 09/16   Pt discussed during ICU rounds. Pt found to have ileus.  Pt for FEES study today at 1300. Discussion of possible PEG if pt fails again. RD to monitor for diet advancement. Cleviprex discontinued. Care team continues to monitor airway protection closely. Persistent hypokalemia also being monitored.  Pt with ICP/Ventriculostomy drain. Pt C diff negative. Pt net positive 5.9 L since admission.   Labs reviewed; CBG 98-126, Potassium 2.8, Magnesium/Phosphorus WNL, Hemoglobin 8.1 Medications reviewed; Multivitamin   Diet Order:     Skin:  Wound (see comment) (R groin incision from 9/13)  Last BM:  9/21 (type 7)   Height:   Ht Readings from Last 1 Encounters:  07/30/17 5' 3"  (1.6 m)    Weight:   Wt Readings from Last 1 Encounters:  08/07/17 149 lb 14.6 oz (68 kg)    Ideal Body Weight:  52.2 kg  BMI:  Body mass index is 26.56 kg/m.  Estimated Nutritional Needs:   Kcal:  1880-2070 (30-33 kcal/kg)  Protein:  82-94 grams (1.3-1.5 grams/kg)  Fluid:  > 1.5 L/day  EDUCATION NEEDS:   No education needs identified at this time  [place] Ranger, MS, RDN, LDN 08/07/2017 12:30 PM

## 2017-08-07 NOTE — Progress Notes (Signed)
INFECTIOUS DISEASE PROGRESS NOTE  ID: Suzanne Brooks is a 41 y.o. female with  Active Problems:   ICH (intracerebral hemorrhage) (HCC)   Subarachnoid hemorrhage (HCC)   Seizure-like activity (HCC)   Acute respiratory failure with hypoxia (HCC)  Subjective: Continued fever overnight  Abtx:  Anti-infectives    Start     Dose/Rate Route Frequency Ordered Stop   08/06/17 0615  vancomycin (VANCOCIN) IVPB 750 mg/150 ml premix     750 mg 150 mL/hr over 60 Minutes Intravenous Every 8 hours 08/05/17 2217     08/02/17 2130  vancomycin (VANCOCIN) IVPB 750 mg/150 ml premix  Status:  Discontinued     750 mg 150 mL/hr over 60 Minutes Intravenous Every 12 hours 08/02/17 0902 08/05/17 2217   08/02/17 0930  vancomycin (VANCOCIN) IVPB 1000 mg/200 mL premix     1,000 mg 200 mL/hr over 60 Minutes Intravenous  Once 08/02/17 0902 08/02/17 1116   08/02/17 0930  cefTAZidime (FORTAZ) 2 g in dextrose 5 % 50 mL IVPB     2 g 100 mL/hr over 30 Minutes Intravenous Every 8 hours 08/02/17 0902        Medications:  Scheduled: .  stroke: mapping our early stages of recovery book   Does not apply Once  . chlorhexidine  15 mL Mouth Rinse BID  . feeding supplement (PRO-STAT SUGAR FREE 64)  30 mL Per Tube Daily  . multivitamin  15 mL Per Tube Daily  . niMODipine  60 mg Oral Q4H   Or  . NiMODipine  60 mg Per Tube Q4H  . ranitidine  150 mg Oral BID    Objective: Vital signs in last 24 hours: Temp:  [99 F (37.2 C)-103 F (39.4 C)] 99.4 F (37.4 C) (09/21 0757) Pulse Rate:  [92-123] 109 (09/21 0900) Resp:  [13-22] 14 (09/21 0900) BP: (139-203)/(96-135) 150/99 (09/21 0900) SpO2:  [99 %-100 %] 100 % (09/21 0900) Weight:  [68 kg (149 lb 14.6 oz)] 68 kg (149 lb 14.6 oz) (09/21 0500)   General appearance: fatigued and no distress Head: minimal tenderness at drain site.  Resp: diminished breath sounds anterior - bilateral Cardio: tachycardia GI: normal findings: soft, non-tender and abnormal  findings:  hypoactive bowel sounds Extremities: LUE with peripheral IVs  Lab Results  Recent Labs  08/05/17 0528 08/06/17 0225 08/06/17 2235  WBC 30.5* 24.4*  --   HGB 9.4* 8.4*  --   HCT 29.4* 26.5*  --   NA 142 145 136  K 2.3* 2.6* 2.8*  CL 106 112* 101  CO2 BUN CREATININE 0.73 0.71 0.68   Liver Panel No results for input(s): PROT, ALBUMIN, AST, ALT, ALKPHOS, BILITOT, BILIDIR, IBILI in the last 72 hours. Sedimentation Rate No results for input(s): ESRSEDRATE in the last 72 hours. C-Reactive Protein No results for input(s): CRP in the last 72 hours.  Microbiology: Recent Results (from the past 240 hour(s))  MRSA PCR Screening     Status: None   Collection Time: 07/30/17  8:30 PM  Result Value Ref Range Status   MRSA by PCR NEGATIVE NEGATIVE Final    Comment:        The GeneXpert MRSA Assay (FDA approved for NASAL specimens only), is one component of a comprehensive MRSA colonization surveillance program. It is not intended to diagnose MRSA infection nor to guide or monitor treatment for MRSA infections.   Culture, blood (Routine X 2) w Reflex to ID Panel  Status: None (Preliminary result)   Collection Time: 08/02/17 10:30 AM  Result Value Ref Range Status   Specimen Description BLOOD RIGHT HAND  Final   Special Requests   Final    BOTTLES DRAWN AEROBIC ONLY Blood Culture results may not be optimal due to an inadequate volume of blood received in culture bottles   Culture NO GROWTH 4 DAYS  Final   Report Status PENDING  Incomplete  Culture, blood (Routine X 2) w Reflex to ID Panel     Status: None (Preliminary result)   Collection Time: 08/02/17 10:35 AM  Result Value Ref Range Status   Specimen Description BLOOD RIGHT HAND  Final   Special Requests   Final    BOTTLES DRAWN AEROBIC ONLY Blood Culture adequate volume   Culture NO GROWTH 4 DAYS  Final   Report Status PENDING  Incomplete  Culture, Urine     Status: Abnormal   Collection  Time: 08/02/17 12:26 PM  Result Value Ref Range Status   Specimen Description URINE, CATHETERIZED  Final   Special Requests NONE  Final   Culture MULTIPLE SPECIES PRESENT, SUGGEST RECOLLECTION (A)  Final   Report Status 08/03/2017 FINAL  Final  C difficile quick scan w PCR reflex     Status: None   Collection Time: 08/05/17  5:11 PM  Result Value Ref Range Status   C Diff antigen NEGATIVE NEGATIVE Final   C Diff toxin NEGATIVE NEGATIVE Final   C Diff interpretation No C. difficile detected.  Final  CSF culture with Stat gram stain     Status: None (Preliminary result)   Collection Time: 08/06/17  8:45 AM  Result Value Ref Range Status   Specimen Description CSF  Final   Special Requests RED TOP TUBE SENT  Final   Gram Stain   Final    CYTOSPIN SMEAR WBC PRESENT,BOTH PMN AND MONONUCLEAR NO ORGANISMS SEEN    Culture PENDING  Incomplete   Report Status PENDING  Incomplete    Studies/Results: Dg Chest Port 1 View  Result Date: 08/06/2017 CLINICAL DATA:  Pneumonia.  History of seizures.  Hypertension . EXAM: PORTABLE CHEST 1 VIEW COMPARISON:  Chest x-ray 08/05/2017, 08/04/2017.  KUB 08/03/2017 . FINDINGS: NG tube noted with its tip in the upper esophagus. Heart size normal. Persistent right base infiltrate. No interim change. No pleural effusion or pneumothorax. Distended loops of bowel are again noted. IMPRESSION: 1. NG tube noted with its tip in the upper esophagus. Repositioning should be considered. 2.  Persistent right base infiltrate without interim change. 3.  Distended loops of bowel again noted. Critical Value/emergent results were called by telephone at the time of interpretation on 08/06/2017 at 8:07 am to nurse Herbert Seta, who verbally acknowledged these results. Electronically Signed   By: Maisie Fus  Register   On: 08/06/2017 08:11   Dg Abd Portable 1v  Result Date: 08/06/2017 CLINICAL DATA:  Right lower quadrant pain.  Ileus. EXAM: PORTABLE ABDOMEN - 1 VIEW COMPARISON:  Prior today  FINDINGS: Nasogastric tube is again seen with tip now in the gastric fundus. Diffuse gaseous distention of the colon is again seen with lesser degree of small-bowel distention. This shows mild decrease since previous study, and is likely due to ileus. IMPRESSION: Probable ileus, with mild decrease in bowel dilatation since prior study. Nasogastric tube tip overlies gastric fundus. Electronically Signed   By: Myles Rosenthal M.D.   On: 08/06/2017 18:37   Dg Abd Portable 1v  Result Date: 08/06/2017 CLINICAL DATA:  Encounter for NG tube placement EXAM: PORTABLE ABDOMEN - 1 VIEW COMPARISON:  08/03/2017 FINDINGS: Severe diffuse gaseous distention of bowel, most pronounced within the colon compatible with ileus. NG tube is in place with the tip in the midportion of the stomach. No free air organomegaly. IMPRESSION: NG tube tip in the mid stomach. Continued marked gaseous distention of bowel, compatible with ileus. Electronically Signed   By: Charlett Nose M.D.   On: 08/06/2017 10:18     Assessment/Plan: Leukocytosis, Fever RLL pna ICH Ileus  C diff (-) Await CSF Cx (CSF prot not markedly elevated, Glc not depressed) No change anbx (treating as if HCAP) Can stop vanco when CSF Cx is negative.  Continued fever, WBC better... Suspect combination of central fever, ileus.   Total days of antibiotics: 4 vanco/ceftaz         Johny Sax Infectious Diseases (pager) [phone] [email or web address] 08/07/2017, 9:11 AM  LOS: 8 days

## 2017-08-08 LAB — BASIC METABOLIC PANEL
Anion gap: 9 (ref 5–15)
BUN: 12 mg/dL (ref 6–20)
CHLORIDE: 108 mmol/L (ref 101–111)
CO2: 19 mmol/L — AB (ref 22–32)
CREATININE: 0.69 mg/dL (ref 0.44–1.00)
Calcium: 8.4 mg/dL — ABNORMAL LOW (ref 8.9–10.3)
GFR calc non Af Amer: >60 mL/min (ref >60)
Glucose, Bld: 119 mg/dL — ABNORMAL HIGH (ref 65–99)
Potassium: 4.1 mmol/L (ref 3.5–5.1)
Sodium: 136 mmol/L (ref 135–145)

## 2017-08-08 LAB — CBC
HEMATOCRIT: 25.4 % — AB (ref 36.0–46.0)
HEMOGLOBIN: 8.3 g/dL — AB (ref 12.0–15.0)
MCH: 27.9 pg (ref 26.0–34.0)
MCHC: 32.7 g/dL (ref 30.0–36.0)
MCV: 85.5 fL (ref 78.0–100.0)
PLATELETS: 357 10*3/uL (ref 150–400)
RBC: 2.97 MIL/uL — AB (ref 3.87–5.11)
RDW: 18.6 % — ABNORMAL HIGH (ref 11.5–15.5)
WBC: 24 10*3/uL — ABNORMAL HIGH (ref 4.0–10.5)

## 2017-08-08 LAB — TRIGLYCERIDES: Triglycerides: 178 mg/dL — ABNORMAL HIGH (ref <150)

## 2017-08-08 LAB — GLUCOSE, CAPILLARY
GLUCOSE-CAPILLARY: 153 mg/dL — AB (ref 65–99)
GLUCOSE-CAPILLARY: 116 mg/dL — AB (ref 65–99)
GLUCOSE-CAPILLARY: 101 mg/dL — AB (ref 65–99)
GLUCOSE-CAPILLARY: 136 mg/dL — AB (ref 65–99)
Glucose-Capillary: 114 mg/dL — ABNORMAL HIGH (ref 65–99)
Glucose-Capillary: 112 mg/dL — ABNORMAL HIGH (ref 65–99)

## 2017-08-08 LAB — PHOSPHORUS: Phosphorus: 2.2 mg/dL — ABNORMAL LOW (ref 2.5–4.6)

## 2017-08-08 LAB — MAGNESIUM: Magnesium: 2.5 mg/dL — ABNORMAL HIGH (ref 1.7–2.4)

## 2017-08-08 LAB — VANCOMYCIN, TROUGH: VANCOMYCIN TR: 12 ug/mL — AB (ref 15–20)

## 2017-08-08 LAB — PATHOLOGIST SMEAR REVIEW

## 2017-08-08 MED ORDER — LEVETIRACETAM 100 MG/ML PO SOLN
1000.0000 mg | Freq: Two times a day (BID) | ORAL | Status: DC
  Administered 2017-08-08 – 2017-09-01 (×47): 1000 mg
  Filled 2017-08-08 (×49): qty 10

## 2017-08-08 MED ORDER — ORAL CARE MOUTH RINSE
15.0000 mL | Freq: Two times a day (BID) | OROMUCOSAL | Status: DC
  Administered 2017-08-08 – 2017-09-01 (×44): 15 mL via OROMUCOSAL

## 2017-08-08 MED ORDER — CHLORHEXIDINE GLUCONATE 0.12 % MT SOLN
15.0000 mL | Freq: Two times a day (BID) | OROMUCOSAL | Status: DC
  Administered 2017-08-08 – 2017-09-01 (×49): 15 mL via OROMUCOSAL
  Filled 2017-08-08 (×24): qty 15

## 2017-08-08 MED ORDER — VANCOMYCIN HCL IN DEXTROSE 1-5 GM/200ML-% IV SOLN
1000.0000 mg | Freq: Three times a day (TID) | INTRAVENOUS | Status: DC
  Administered 2017-08-08 – 2017-08-11 (×9): 1000 mg via INTRAVENOUS
  Filled 2017-08-08 (×9): qty 200

## 2017-08-08 MED ORDER — LOPERAMIDE HCL 1 MG/5ML PO LIQD
2.0000 mg | ORAL | Status: DC | PRN
  Administered 2017-08-08 – 2017-08-28 (×18): 2 mg
  Filled 2017-08-08 (×18): qty 10

## 2017-08-08 NOTE — Progress Notes (Addendum)
PULMONARY / CRITICAL CARE MEDICINE   Name: Suzanne Brooks MRN: [id number] DOB: [birth year]    ADMISSION DATE:  07/30/2017 CONSULTATION DATE:  07/30/2017  REFERRING MD:  Conchita Paris  CHIEF COMPLAINT:  SAH  HISTORY OF PRESENT ILLNESS:   41 yo female presented with seizures and found to have SAH and Lt frontal lobe ICH.  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 PMHx of HTN.  SUBJECTIVE:  Denies chest pain, dyspnea.  Fever curve better.  VITAL SIGNS: BP (!) 139/93   Pulse 99   Temp (!) 100.5 F (38.1 C) (Axillary)   Resp 17   Ht  (1.6 m)   Wt 140 lb 14 oz (63.9 kg)   LMP  (LMP Unknown) Comment: neg preg test 07/30/17  SpO2 100%   BMI 24.95 kg/m   INTAKE / OUTPUT: I/O last 3 completed shifts: In: 6583.5 [I.V.:3600; NG/GT:1136.7; IV Piggyback:1846.8] Out: 4924 [Urine:3150; Drains:449; Stool:1325]  PHYSICAL EXAMINATION:  General - alert Eyes - pupils reactive ENT - EVD in place, Lt facial droop Cardiac - regular, no murmur Chest - no wheeze, rales Abd - soft, non tender Ext - no edema Skin - no rashes Neuro - weak on Rt side   LABS:  BMET  Recent Labs Lab 08/06/17 2235 08/07/17 1015 08/08/17 0258  NA 136 138 136  K 2.8* 2.8* 4.1  CL 101 102 108  CO2 23 26 19*  BUN CREATININE 0.68 0.67 0.69  GLUCOSE 120* 145* 119*   Electrolytes  Recent Labs Lab 08/06/17 0225 08/06/17 2235 08/07/17 1015 08/08/17 0258  CALCIUM 9.0 9.0 9.0 8.4*  MG 2.1  --  2.0 2.5*  PHOS 2.0*  --  3.5 2.2*   CBC  Recent Labs Lab 08/06/17 0225 08/07/17 1015 08/08/17 0258  WBC 24.4* 20.5* 24.0*  HGB 8.4* 8.1* 8.3*  HCT 26.5* 25.9* 25.4*  PLT 294 344 357   Coag's No results for input(s): APTT, INR in the last 168 hours. Sepsis Markers  Recent Labs Lab 08/02/17 1016 08/02/17 1222 08/03/17 1014 08/04/17 0201 08/05/17 0528  LATICACIDVEN 2.2* 2.6*  --   --   --   PROCALCITON  --   --  0.44 0.23 0.21    Glucose  Recent Labs Lab  08/07/17 1121 08/07/17 1604 08/07/17 2002 08/07/17 2344 08/08/17 0346 08/08/17 0737  GLUCAP 126* 134* 129* 106* 114* 153*   Imaging No results found.   STUDIES:  CT head 9/13 > Extensive subarachnoid hemorrhage throughout the basilar cisterns, sylvian fissure and interhemispheric fissure question ruptured aneurysm. Additional intra cerebral hematoma within the inferior LEFT frontal lobe 3.5 x 1.4 x 1.8 cm, un common to have intraparenchymal extension of subarachnoid hemorrhage, question concomitant or subsequent fall with traumatic hemorrhage? 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  Persistent fever post op.  ASSESSMENT / PLAN:  Seizure. SAH, ICH s/p aneurysmal coiling. - IVD per neurosurgery - continue keppra, nimotop  Fever with concern for HCAP,  but also centrally mediated. - Abx per ID >> recommending d/c vancomycin if CSF culture negative - f/u CXR 9/24  Compromised airway for [facility], ICH. - resolved  Hypertension. - goal SBP < 180  Hypokalemia. - f/u BMET  Ileus. - monitor clinically  Dysphagia. - f/u with speech therapy  Anemia of critical illness. - f/u CBC  DVT prophylaxis - SCDs SUP - zantac Nutrition - tube feeds Goals of care - full code  PCCM will f/u on Monday, 9/24 >> call if help needed sooner.   Coralyn Helling, MD [facility] Pulmonary/Critical Care 08/08/2017, 9:15 AM Pager:  [phone] After 3pm call: [phone]

## 2017-08-08 NOTE — Progress Notes (Signed)
ANTIBIOTIC CONSULT NOTE   Pharmacy Consult for Vancomycin Indication: sepsis, PNA  No Known Allergies  Patient Measurements: Height:  (160 cm) Weight: 140 lb 14 oz (63.9 kg) IBW/kg (Calculated) : 52.4 Adjusted Body Weight:    Vital Signs: Temp: 98.8 F (37.1 C) (09/22 1400) Temp Source: Oral (09/22 1400) BP: 134/90 (09/22 1400) Pulse Rate: 102 (09/22 1400) Intake/Output from previous day: 09/21 0701 - 09/22 0700 In: 4813.5 [I.V.:2400; NG/GT:1136.7; IV Piggyback:1276.8] Out: 2792 [Urine:1550; Drains:317; Stool:925] Intake/Output from this shift: Total I/O In: 1100 [I.V.:700; NG/GT:350; IV Piggyback:50] Out: 118 [Drains:118]  Labs:  Recent Labs  08/06/17 0225 08/06/17 2235 08/07/17 1015 08/08/17 0258  WBC 24.4*  --  20.5* 24.0*  HGB 8.4*  --  8.1* 8.3*  PLT 294  --  344 357  CREATININE 0.71 0.68 0.67 0.69   Estimated Creatinine Clearance: 81.6 mL/min (by C-G formula based on SCr of 0.69 mg/dL).  Recent Labs  08/05/17 2119 08/08/17 1407  VANCOTROUGH 7* 12*     Microbiology:  Medical History: Past Medical History:  Diagnosis Date  . Hypertension   . Seizures (HCC)     ID: Possible CNS infection, awaiting final results of LP. D7 Abx for sepsis, ?PNA. Tmax/24h 103.1, WBC down 36.8, LA 2.6, PCT 0.44>0.21 Trough low at 12 SCr 0.69   Goal of Therapy:  Vancomycin trough level 15-20 mcg/ml   Plan:  Increase Vancomycin to 1g/8h Monitor renal fx, cultures, VT as needed   Agapito Games, PharmD, BCPS Clinical Pharmacist 08/08/2017 4:07 PM

## 2017-08-08 NOTE — Progress Notes (Signed)
Orthopedic Tech Progress Note Patient Details:  Suzanne Brooks 04-04-1976 [id number]  Ortho Devices Type of Ortho Device: Postop shoe/boot Ortho Device/Splint Location: heel boot Ortho Device/Splint Interventions: Application   Saul Fordyce 08/08/2017, 1:11 PM

## 2017-08-08 NOTE — Progress Notes (Signed)
No issues overnight.   EXAM:  BP 131/82   Pulse 90   Temp (!) 100.5 F (38.1 C) (Axillary)   Resp 14   Ht  (1.6 m)   Wt 63.9 kg (140 lb 14 oz)   LMP  (LMP Unknown) Comment: neg preg test 07/30/17  SpO2 100%   BMI 24.95 kg/m   Awake, alert, oriented to person, hospital, year CN grossly intact  Good strength BUE 4/5 LLE Does have 1/5 proximal RLE strength, no movement distal EVD in place  IMPRESSION:  41 y.o. female SAH d# 10 s/p LOph a. Aneurysm coiling, remains neurologically stable. Febrile, blood, urine, CSF cultures negative, suspect central origin.  PLAN: - Cont current mgmt

## 2017-08-09 LAB — CSF CULTURE: CULTURE: NO GROWTH

## 2017-08-09 LAB — GLUCOSE, CAPILLARY
GLUCOSE-CAPILLARY: 125 mg/dL — AB (ref 65–99)
Glucose-Capillary: 104 mg/dL — ABNORMAL HIGH (ref 65–99)
Glucose-Capillary: 105 mg/dL — ABNORMAL HIGH (ref 65–99)
Glucose-Capillary: 116 mg/dL — ABNORMAL HIGH (ref 65–99)
Glucose-Capillary: 121 mg/dL — ABNORMAL HIGH (ref 65–99)
Glucose-Capillary: 123 mg/dL — ABNORMAL HIGH (ref 65–99)

## 2017-08-09 LAB — BASIC METABOLIC PANEL
ANION GAP: 9 (ref 5–15)
BUN: 8 mg/dL (ref 6–20)
CO2: 23 mmol/L (ref 22–32)
Calcium: 8.7 mg/dL — ABNORMAL LOW (ref 8.9–10.3)
Chloride: 105 mmol/L (ref 101–111)
Creatinine, Ser: 0.62 mg/dL (ref 0.44–1.00)
Glucose, Bld: 123 mg/dL — ABNORMAL HIGH (ref 65–99)
POTASSIUM: 3.1 mmol/L — AB (ref 3.5–5.1)
SODIUM: 137 mmol/L (ref 135–145)

## 2017-08-09 LAB — CBC
HCT: 25.9 % — ABNORMAL LOW (ref 36.0–46.0)
HEMOGLOBIN: 8.2 g/dL — AB (ref 12.0–15.0)
MCH: 27.2 pg (ref 26.0–34.0)
MCHC: 31.7 g/dL (ref 30.0–36.0)
MCV: 85.8 fL (ref 78.0–100.0)
PLATELETS: 445 10*3/uL — AB (ref 150–400)
RBC: 3.02 MIL/uL — ABNORMAL LOW (ref 3.87–5.11)
RDW: 18.5 % — AB (ref 11.5–15.5)
WBC: 27.9 10*3/uL — AB (ref 4.0–10.5)

## 2017-08-09 LAB — CSF CULTURE W GRAM STAIN

## 2017-08-09 NOTE — Progress Notes (Signed)
No issues overnight.   EXAM:  BP (!) 167/112   Pulse 88   Temp 100.2 F (37.9 C)   Resp 18   Ht  (1.6 m)   Wt 71.5 kg (157 lb 10.1 oz) Comment: cooling blanket added  LMP  (LMP Unknown) Comment: neg preg test 07/30/17  SpO2 100%   BMI 27.92 kg/m   Easily arousable Follows commands BUE, appears good strength LLE good ~4/5 strength No voluntary movement RLE  IMPRESSION:  42 y.o. female SAH d# 11 s/p L Oph a. Aneurysm coiling, remains stable Febrile, likely central origin  PLAN: - Cont supportive care

## 2017-08-10 ENCOUNTER — Inpatient Hospital Stay (HOSPITAL_COMMUNITY): Payer: Medicaid Other

## 2017-08-10 ENCOUNTER — Inpatient Hospital Stay (HOSPITAL_BASED_OUTPATIENT_CLINIC_OR_DEPARTMENT_OTHER): Payer: Medicaid Other

## 2017-08-10 DIAGNOSIS — I609 Nontraumatic subarachnoid hemorrhage, unspecified: Secondary | ICD-10-CM

## 2017-08-10 LAB — GLUCOSE, CAPILLARY
GLUCOSE-CAPILLARY: 133 mg/dL — AB (ref 65–99)
GLUCOSE-CAPILLARY: 145 mg/dL — AB (ref 65–99)
GLUCOSE-CAPILLARY: 167 mg/dL — AB (ref 65–99)
Glucose-Capillary: 108 mg/dL — ABNORMAL HIGH (ref 65–99)
Glucose-Capillary: 132 mg/dL — ABNORMAL HIGH (ref 65–99)
Glucose-Capillary: 141 mg/dL — ABNORMAL HIGH (ref 65–99)

## 2017-08-10 MED ORDER — POTASSIUM CHLORIDE 10 MEQ/100ML IV SOLN
10.0000 meq | INTRAVENOUS | Status: AC
  Administered 2017-08-10 (×4): 10 meq via INTRAVENOUS
  Filled 2017-08-10 (×4): qty 100

## 2017-08-10 MED ORDER — IBUPROFEN 100 MG/5ML PO SUSP
600.0000 mg | Freq: Four times a day (QID) | ORAL | Status: DC | PRN
  Administered 2017-08-10 – 2017-08-23 (×14): 600 mg
  Filled 2017-08-10 (×14): qty 30

## 2017-08-10 NOTE — Progress Notes (Signed)
  Speech Language Pathology Treatment: Dysphagia  Patient Details Name: Suzanne Brooks MRN: [id number] DOB: 06-May-1976 Today's Date: 08/10/2017 Time: 4010-2725 SLP Time Calculation (min) (ACUTE ONLY): 11 min  Assessment / Plan / Recommendation Clinical Impression  Patient alert, upright in bed, able to follow commands for use of aspiration precautions and compensatory strategies with min-moderate verbal and tactile cueing today. Impulsivity with self feeding impacting safety. Patient with continued dysphonia likely a result of decreased movement of right vocal cord as noted on FEES and subtle wet vocal quality suggestive of decreased airway protection with secretions which continued during po trials of self fed ice chips. Strategies provided which included use of throat clearing and effortful re-swallow only minimally effective to clear vocal quality. Although no notable gains in swallowing function noted as compared to evaluation 9/21, patient now with improving alertness and ability to utilize compensatory strategies. Is also 9 days post extubation. Plan for repeat instrumental exam 9/25 (MBS given decreased visibility with FEES 9/21) to determine potential to initiate a full po diet. May have ice chips with full RN supervision following oral care.     HPI HPI: Pt is a 41 y/o female who presents with seizure activity. 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 ETT 9/13-9/15.       SLP Plan  MBS       Recommendations  Diet recommendations: NPO (except ice chips after oral care) Medication Administration: Via alternative means                Oral Care Recommendations: Oral care QID;Oral care prior to ice chip/H20 Follow up Recommendations: Inpatient Rehab SLP Visit Diagnosis: Dysphagia, oropharyngeal phase (R13.12) Plan: MBS       GO             Suzanne Lango MA, CCC-SLP [phone]    Suzanne Brooks  Suzanne Brooks 08/10/2017, 4:16 PM

## 2017-08-10 NOTE — Progress Notes (Addendum)
Physical Therapy Treatment Patient Details Name: Suzanne Brooks MRN: [id number] DOB: 05-08-1976 Today's Date: 08/10/2017    History of Present Illness Pt is a 41 y/o female who presents with seizure activity. Pt was found to have a subarachnoid hemorrhage, and is now s/p coiling of left ophthalmic aneurysm with EVD on 9/14.PMHx: HTN, Sz    PT Comments    Pt was able to get OOB to the chair today with two person max assist transfer to her left side.  She continues to have significant cognitive and physical deficits.  She would benefit from intensive post acute rehab at discharge.  PT will continue to follow acutely.    Follow Up Recommendations  CIR;Supervision/Assistance - 24 hour     Equipment Recommendations  Wheelchair (measurements PT);Wheelchair cushion (measurements PT);Hospital bed;Other (comment) (hoyer lift)    Recommendations for Other Services Rehab consult     Precautions / Restrictions Precautions Precautions: Fall Precaution Comments: EVD    Mobility  Bed Mobility Overal bed mobility: Needs Assistance Bed Mobility: Supine to Sit     Supine to sit: +2 for physical assistance;Max assist     General bed mobility comments: Two person max assist to support trunk to get to sitting EOB.  Posterior preference throughout and pt pushing with her left arm to the right.    Transfers Overall transfer level: Needs assistance Equipment used: 2 person hand held assist Transfers: Sit to/from [employer] Sit to Stand: Max assist;+2 physical assistance;From elevated surface Stand pivot transfers: +2 physical assistance;Max assist;From elevated surface       General transfer comment: Pt needed two person max assist to stand x2 (better the second time) and then pivot to the recliner chair on her left (strong) side.  Pt with posterior lean and forward feet throghout transfer.    Ambulation/Gait             General Gait Details: unable at this time.         Modified Rankin (Stroke Patients Only) Modified Rankin (Stroke Patients Only) Pre-Morbid Rankin Score: No symptoms Modified Rankin: Severe disability     Balance Overall balance assessment: Needs assistance Sitting-balance support: Feet supported;Single extremity supported Sitting balance-Leahy Scale: Zero Sitting balance - Comments: Max two person assist initially EOB, did get up to mod assist once there for a few minutes.                                     Cognition Arousal/Alertness: Awake/alert Behavior During Therapy: Flat affect Overall Cognitive Status: Impaired/Different from baseline Area of Impairment: Orientation;Attention;Memory;Following commands;Safety/judgement;Awareness;Problem solving                 Orientation Level: Disoriented to;Time;Place;Person;Situation Current Attention Level: Focused Memory: Decreased recall of precautions;Decreased short-term memory Following Commands: Follows one step commands inconsistently;Follows one step commands with increased time Safety/Judgement: Decreased awareness of safety;Decreased awareness of deficits Awareness: Intellectual Problem Solving: Slow processing;Requires verbal cues;Requires tactile cues;Difficulty sequencing;Decreased initiation General Comments: Pt is speaking more, but not always making sense.   She had a visitor in the room that she could not identify.               Pertinent Vitals/Pain Pain Assessment: Faces Faces Pain Scale: Hurts even more Pain Location: with movement of right leg Pain Descriptors / Indicators: Grimacing;Discomfort Pain Intervention(s): Limited activity within patient's tolerance;Monitored during session;Repositioned  PT Goals (current goals can now be found in the care plan section) Acute Rehab PT Goals Patient Stated Goal: none stated Progress towards PT goals: Progressing toward goals    Frequency    Min 3X/week       PT Plan Current plan remains appropriate       AM-PAC PT "6 Clicks" Daily Activity  Outcome Measure  Difficulty turning over in bed (including adjusting bedclothes, sheets and blankets)?: Unable Difficulty moving from lying on back to sitting on the side of the bed? : Unable Difficulty sitting down on and standing up from a chair with arms (e.g., wheelchair, bedside commode, etc,.)?: Unable Help needed moving to and from a bed to chair (including a wheelchair)?: A Lot Help needed walking in hospital room?: Total Help needed climbing 3-5 steps with a railing? : Total 6 Click Score: 7    End of Session Equipment Utilized During Treatment: Gait belt Activity Tolerance: Patient limited by pain;Patient limited by fatigue Patient left: in chair;with call bell/phone within reach;with chair alarm set;with nursing/sitter in room   PT Visit Diagnosis: Muscle weakness (generalized) (M62.81);Other symptoms and signs involving the nervous system (R29.898);Other abnormalities of gait and mobility (R26.89)     Time: 9562-1308 PT Time Calculation (min) (ACUTE ONLY): 35 min  Charges:  $Therapeutic Activity: 23-37 mins   Atiyah Bauer B. Keeva Reisen, PT, DPT [phone]           08/10/2017, 10:47 PM

## 2017-08-10 NOTE — Progress Notes (Signed)
Transcranial Doppler  Date POD PCO2 HCT BP  MCA ACA PCA OPHT SIPH VERT Basilar  9/14 vs     Right  Left   49  44   -14  -44   21  37   -30  -35     -37    9/17 je     Right  Left   79  143   -45  -67   12  32   31  19   109  42   -60  -37   -54      9/19rs     Right  Left   164  181   -38  -71   48  66   29  26   59  46   -31  *   *      9/21rs      Right  Left   113  166   -35  -98   40  49  45   -42  -38   -90      9/24rs      Right  Left   191  188   -38  -39   37  25   39  28   33  36   -71  -49   -52           Right  Left                                            Right  Left                                        MCA = Middle Cerebral Artery      OPHT = Opthalmic Artery     BASILAR = Basilar Artery   ACA = Anterior Cerebral Artery     SIPH = Carotid Siphon PCA = Posterior Cerebral Artery   VERT = Verterbral Artery                   Normal MCA = 62+\-12 ACA = 50+\-12 PCA = 42+\-23   9/17 [place] ratio: right 4.4 left 4.6. je 9/19 - [place] ratio: right 3.4, left 6.0. (*) not insonated. RDS 9/21 - [place]  Ratio: right 3.8, left 7.2 - rds 9/24 - [place] Ratio: right, 4.7. Left 6.7 - rds

## 2017-08-10 NOTE — Progress Notes (Signed)
PULMONARY / CRITICAL CARE MEDICINE   Name: Suzanne Brooks MRN: [id number] DOB: May 26, 1976    ADMISSION DATE:  07/30/2017 CONSULTATION DATE:  07/30/2017  REFERRING MD:  Conchita Paris  CHIEF COMPLAINT:  SAH  HISTORY OF PRESENT ILLNESS:   41 yo female presented with seizures and found to have SAH and Lt frontal lobe ICH.  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 PMHx of HTN.  SUBJECTIVE:  Continues to spike fevers CXR looks better.  VITAL SIGNS: BP (!) 124/92   Pulse 92   Temp 99.7 F (37.6 C)   Resp 16   Ht  (1.6 m)   Wt 152 lb 5.4 oz (69.1 kg)   LMP  (LMP Unknown) Comment: neg preg test 07/30/17  SpO2 100%   BMI 26.99 kg/m   INTAKE / OUTPUT: I/O last 3 completed shifts: In: 6420 [I.V.:3500; NG/GT:1870; IV Piggyback:1050] Out: 4888 [Urine:3425; Drains:463; Stool:1000]  PHYSICAL EXAMINATION: Blood pressure (!) 124/92, pulse 92, temperature 99.7 F (37.6 C), resp. rate 16, height  (1.6 m), weight 152 lb 5.4 oz (69.1 kg), SpO2 100 %. Gen:      No acute distress HEENT:  EOMI, sclera anicteric Neck:     No masses; no thyromegaly Lungs:    Clear to auscultation bilaterally; normal respiratory effort CV:         Regular rate and rhythm; no murmurs Abd:      + bowel sounds; soft, non-tender; no palpable masses, no distension Ext:    No edema; adequate peripheral perfusion Skin:      Warm and dry; no rash Neuro: Awake, interactive  LABS:  BMET  Recent Labs Lab 08/07/17 1015 08/08/17 0258 08/09/17 0236  NA 138 136 137  K 2.8* 4.1 3.1*  CL 102 108 105  CO2 26 19* 23  BUN CREATININE 0.67 0.69 0.62  GLUCOSE 145* 119* 123*   Electrolytes  Recent Labs Lab 08/06/17 0225  08/07/17 1015 08/08/17 0258 08/09/17 0236  CALCIUM 9.0  < > 9.0 8.4* 8.7*  MG 2.1  --  2.0 2.5*  --   PHOS 2.0*  --  3.5 2.2*  --   < > = values in this interval not displayed. CBC  Recent Labs Lab 08/07/17 1015 08/08/17 0258 08/09/17 0236  WBC 20.5*  24.0* 27.9*  HGB 8.1* 8.3* 8.2*  HCT 25.9* 25.4* 25.9*  PLT 344 357 445*   Coag's No results for input(s): APTT, INR in the last 168 hours. Sepsis Markers  Recent Labs Lab 08/04/17 0201 08/05/17 0528  PROCALCITON 0.23 0.21    Glucose  Recent Labs Lab 08/09/17 1209 08/09/17 1559 08/09/17 1956 08/09/17 2341 08/10/17 0447 08/10/17 0808  GLUCAP 105* 123* 125* 121* 141* 132*   Imaging Dg Chest Port 1 View  Result Date: 08/10/2017 CLINICAL DATA:  Healthcare associated pneumonia EXAM: PORTABLE CHEST 1 VIEW COMPARISON:  Four days ago FINDINGS: Improved positioning of nasogastric tube, which reaches the stomach. Improved aeration in the lower lungs, now with mild streaky density in both infrahilar regions. No edema, effusion, or pneumothorax. Stable mild cardiomegaly. Stable aortic and hilar contours. Less distended bowel in the left upper quadrant. IMPRESSION: 1. History of pneumonia with significantly improved aeration at the bases. 2. Nasogastric tube in good position. Electronically Signed   By: Marnee Spring M.D.   On: 08/10/2017 07:19     STUDIES:  CT head 9/13 > Extensive subarachnoid hemorrhage throughout the basilar cisterns, sylvian fissure and interhemispheric fissure  question ruptured aneurysm. Additional intra cerebral hematoma within the inferior LEFT frontal lobe 3.5 x 1.4 x 1.8 cm, un common to have intraparenchymal extension of subarachnoid hemorrhage, question concomitant or subsequent fall with traumatic hemorrhage? 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  Persistent fever post op.  ASSESSMENT / PLAN: Seizure. SAH, ICH s/p aneurysmal coiling. IVD management per eurosurgery Continue Keppra and pneumonia.  Fever with concern for HCAP, but also centrally mediated. Continue antibiotics CXR looks better today  Compromised airway for [facility], ICH. Resolved  Hypokalemia.  Replete K  Diarrhea, C diff negative Supportive care  Dysphagia. Speech therapy  DVT prophylaxis - SCDs SUP - zantac Nutrition - tube feeds Goals of care - full code  Chilton Greathouse MD [place] [facility] Pager [phone] If no answer or after 3pm call: [phone] 08/10/2017, 12:04 PM

## 2017-08-10 NOTE — Progress Notes (Signed)
Pt seen and examined. No issues overnight.   EXAM: Temp:  [98.9 F (37.2 C)-102.9 F (39.4 C)] 98.9 F (37.2 C) (09/24 1630) Pulse Rate:  [74-117] 90 (09/24 1700) Resp:  [12-21] 14 (09/24 1700) BP: (119-180)/(73-111) 126/92 (09/24 1700) SpO2:  [99 %-100 %] 100 % (09/24 1700) Weight:  [69.1 kg (152 lb 5.4 oz)] 69.1 kg (152 lb 5.4 oz) (09/24 0413) Intake/Output      09/23 0701 - 09/24 0700 09/24 0701 - 09/25 0700   I.V. (mL/kg) 2300 (33.3) 900 (13)   NG/GT 1270 450   IV Piggyback 750 350   Total Intake(mL/kg) 4320 (62.5) 1700 (24.6)   Urine (mL/kg/hr) 1925 (1.2) 800 (1.1)   Drains 306 102   Stool 920 450   Total Output 3151 1352   Net +1169 +348         Awake, alert Answers simple questions Follows commands, symmetric strength BUE Can move LLE antigravity, no movement RLE EVD in place  LABS: Lab Results  Component Value Date   CREATININE 0.62 08/09/2017   BUN 8 08/09/2017   NA 137 08/09/2017   K 3.1 (L) 08/09/2017   CL 105 08/09/2017   CO2 23 08/09/2017   Lab Results  Component Value Date   WBC 27.9 (H) 08/09/2017   HGB 8.2 (L) 08/09/2017   HCT 25.9 (L) 08/09/2017   MCV 85.8 08/09/2017   PLT 445 (H) 08/09/2017    IMPRESSION: - 41 y.o. female SAH d# 12 s/p coiling left ophthalmic aneurysm, remains stable - Fevers likely central  PLAN: - Check TCD today - May attempt to begin to wean EVD tomorrow

## 2017-08-11 ENCOUNTER — Encounter (HOSPITAL_COMMUNITY): Payer: Self-pay | Admitting: Neurosurgery

## 2017-08-11 ENCOUNTER — Inpatient Hospital Stay (HOSPITAL_COMMUNITY): Payer: Medicaid Other

## 2017-08-11 LAB — BASIC METABOLIC PANEL
ANION GAP: 7 (ref 5–15)
BUN: 10 mg/dL (ref 6–20)
CALCIUM: 8.7 mg/dL — AB (ref 8.9–10.3)
CO2: 24 mmol/L (ref 22–32)
Chloride: 107 mmol/L (ref 101–111)
Creatinine, Ser: 0.59 mg/dL (ref 0.44–1.00)
GFR calc non Af Amer: >60 mL/min (ref >60)
Glucose, Bld: 150 mg/dL — ABNORMAL HIGH (ref 65–99)
Potassium: 3.3 mmol/L — ABNORMAL LOW (ref 3.5–5.1)
SODIUM: 138 mmol/L (ref 135–145)

## 2017-08-11 LAB — CBC
HEMATOCRIT: 24.6 % — AB (ref 36.0–46.0)
HEMOGLOBIN: 7.8 g/dL — AB (ref 12.0–15.0)
MCH: 27.3 pg (ref 26.0–34.0)
MCHC: 31.7 g/dL (ref 30.0–36.0)
MCV: 86 fL (ref 78.0–100.0)
Platelets: 606 10*3/uL — ABNORMAL HIGH (ref 150–400)
RBC: 2.86 MIL/uL — ABNORMAL LOW (ref 3.87–5.11)
RDW: 19.9 % — ABNORMAL HIGH (ref 11.5–15.5)
WBC: 21 10*3/uL — AB (ref 4.0–10.5)

## 2017-08-11 LAB — GLUCOSE, CAPILLARY
GLUCOSE-CAPILLARY: 130 mg/dL — AB (ref 65–99)
GLUCOSE-CAPILLARY: 107 mg/dL — AB (ref 65–99)
GLUCOSE-CAPILLARY: 122 mg/dL — AB (ref 65–99)
GLUCOSE-CAPILLARY: 129 mg/dL — AB (ref 65–99)
Glucose-Capillary: 139 mg/dL — ABNORMAL HIGH (ref 65–99)
Glucose-Capillary: 154 mg/dL — ABNORMAL HIGH (ref 65–99)

## 2017-08-11 LAB — MAGNESIUM: MAGNESIUM: 2.1 mg/dL (ref 1.7–2.4)

## 2017-08-11 LAB — PHOSPHORUS: PHOSPHORUS: 3.2 mg/dL (ref 2.5–4.6)

## 2017-08-11 LAB — PROCALCITONIN: Procalcitonin: <0.1 ng/mL

## 2017-08-11 NOTE — Progress Notes (Signed)
Pt seen and examined. No issues overnight.   EXAM: Temp:  [98.9 F (37.2 C)-102.8 F (39.3 C)] 100.6 F (38.1 C) (09/25 0800) Pulse Rate:  [85-119] 114 (09/25 0900) Resp:  [14-24] 16 (09/25 0900) BP: (114-179)/(73-113) 132/81 (09/25 0900) SpO2:  [99 %-100 %] 100 % (09/25 0900) Weight:  [67.7 kg (149 lb 4 oz)] 67.7 kg (149 lb 4 oz) (09/25 0331) Intake/Output      09/24 0701 - 09/25 0700 09/25 0701 - 09/26 0700   I.V. (mL/kg) 2400 (35.5) 200 (3)   NG/GT 1200 100   IV Piggyback 800 200   Total Intake(mL/kg) 4400 (65) 500 (7.4)   Urine (mL/kg/hr) 2500 (1.5)    Drains 310 29   Stool 800    Total Output 3610 29   Net +790 +471         Drowsy but arousable Will move BUE to command, appears symetric Antigravity LLE Still no voluntary movement RLE EVD in place, open at 5  LABS: Lab Results  Component Value Date   CREATININE 0.62 08/09/2017   BUN 8 08/09/2017   NA 137 08/09/2017   K 3.1 (L) 08/09/2017   CL 105 08/09/2017   CO2 23 08/09/2017   Lab Results  Component Value Date   WBC 27.9 (H) 08/09/2017   HGB 8.2 (L) 08/09/2017   HCT 25.9 (L) 08/09/2017   MCV 85.8 08/09/2017   PLT 445 (H) 08/09/2017    IMAGING: TCD reviewed, increased bilateral MCA velocities  IMPRESSION: - 41 y.o. female SAH d# 13 s/p coiling of left ophthalmic aneurysm. Remains neurologically stable, RLE monoplegia persists. TCD elevated but does not seem to have clinical correlate. - Febrile, previous cultures ~1wk ago negative.  PLAN: - Cont supportive care - Reculture today - With elevated TCD velocity, will continue CSF drainage open at 5.

## 2017-08-11 NOTE — Progress Notes (Signed)
OT Cancellation Note  Patient Details Name: Suzanne Brooks MRN: [id number] DOB: [birth year]   Cancelled Treatment:    Reason Eval/Treat Not Completed: Medical issues which prohibited therapy.  Pt with supine BP 166/111, and pt lethargic. Will reattempt.  Kamie Korber [place], OTR/L [phone]   Jeani Hawking M 08/11/2017, 3:13 PM

## 2017-08-11 NOTE — Social Work (Signed)
CSW reviewed chart.  Recommendation for CIR. CSW will continue to follow.  Keene Breath, LCSW Clinical Social Worker [phone]

## 2017-08-11 NOTE — Progress Notes (Signed)
PULMONARY / CRITICAL CARE MEDICINE   Name: Suzanne Brooks MRN: [id number] DOB: 25-Dec-1975    ADMISSION DATE:  07/30/2017 CONSULTATION DATE:  07/30/2017  REFERRING MD:  Conchita Paris  CHIEF COMPLAINT:  SAH  HISTORY OF PRESENT ILLNESS:   41 yo female presented with seizures and found to have SAH and Lt frontal lobe ICH.  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 PMHx of HTN.  SUBJECTIVE:  No change in mental status Continues to spike fevers  VITAL SIGNS: BP 132/81   Pulse (!) 114   Temp (!) 100.6 F (38.1 C) (Axillary)   Resp 16   Ht  (1.6 m)   Wt 149 lb 4 oz (67.7 kg)   LMP  (LMP Unknown) Comment: neg preg test 07/30/17  SpO2 100%   BMI 26.44 kg/m   INTAKE / OUTPUT: I/O last 3 completed shifts: In: 6332.5 [I.V.:3521.7; NG/GT:1760.8; IV Piggyback:1050] Out: 5081 [Urine:3300; Drains:481; Stool:1300]  PHYSICAL EXAMINATION: Blood pressure 132/81, pulse (!) 114, temperature (!) 100.6 F (38.1 C), temperature source Axillary, resp. rate 16, height  (1.6 m), weight 149 lb 4 oz (67.7 kg), SpO2 100 %. Gen:      No acute distress HEENT:  EOMI, sclera anicteric Neck:     No masses; no thyromegaly Lungs:    Clear to auscultation bilaterally; normal respiratory effort CV:         Regular rate and rhythm; no murmurs Abd:      + bowel sounds; soft, non-tender; no palpable masses, no distension Ext:    No edema; adequate peripheral perfusion Skin:      Warm and dry; no rash Neuro: Awake, non verbal. Interacts by moving head  LABS:  BMET  Recent Labs Lab 08/07/17 1015 08/08/17 0258 08/09/17 0236  NA 138 136 137  K 2.8* 4.1 3.1*  CL 102 108 105  CO2 26 19* 23  BUN CREATININE 0.67 0.69 0.62  GLUCOSE 145* 119* 123*   Electrolytes  Recent Labs Lab 08/06/17 0225  08/07/17 1015 08/08/17 0258 08/09/17 0236  CALCIUM 9.0  < > 9.0 8.4* 8.7*  MG 2.1  --  2.0 2.5*  --   PHOS 2.0*  --  3.5 2.2*  --   < > = values in this interval not  displayed. CBC  Recent Labs Lab 08/07/17 1015 08/08/17 0258 08/09/17 0236  WBC 20.5* 24.0* 27.9*  HGB 8.1* 8.3* 8.2*  HCT 25.9* 25.4* 25.9*  PLT 344 357 445*   Coag's No results for input(s): APTT, INR in the last 168 hours. Sepsis Markers  Recent Labs Lab 08/05/17 0528  PROCALCITON 0.21    Glucose  Recent Labs Lab 08/10/17 1203 08/10/17 1529 08/10/17 1941 08/10/17 2335 08/11/17 0348 08/11/17 0810  GLUCAP 133* 145* 167* 108* 130* 139*   Imaging No results found.   STUDIES:  CT head 9/13 > Extensive subarachnoid hemorrhage throughout the basilar cisterns, sylvian fissure and interhemispheric fissure question ruptured aneurysm. Additional intra cerebral hematoma within the inferior LEFT frontal lobe 3.5 x 1.4 x 1.8 cm, un common to have intraparenchymal extension of subarachnoid hemorrhage, question concomitant or subsequent fall with traumatic hemorrhage? 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  Persistent fever post op.  ASSESSMENT / PLAN: Seizure. SAH, ICH s/p aneurysmal coiling. IVD management per eurosurgery Continue Keppra and nimodipine  Fever with concern for HCAP, but also centrally mediated. Stop vanco as CSF does not show MRSA Continue ceftaz.  Check Pct and repeat cultures  Compromised airway for [facility], ICH. Resolved  Hypokalemia.  Replete K  Diarrhea, C diff negative Supportive care  Dysphagia. Speech therapy  DVT  prophylaxis - SCDs SUP - zantac Nutrition - tube feeds Goals of care - full code  Chilton Greathouse MD [place] [facility] Pager [phone] If no answer or after 3pm call: [phone] 08/11/2017, 9:23 AM

## 2017-08-11 NOTE — Progress Notes (Signed)
Pt is increasingly more lethargic.  Neurosurgery paged.  No new orders at this time.  Will continue to monitor.

## 2017-08-11 NOTE — Progress Notes (Signed)
INFECTIOUS DISEASE PROGRESS NOTE  ID: Suzanne Brooks is a 41 y.o. female with  Active Problems:   ICH (intracerebral hemorrhage) (HCC)   Subarachnoid hemorrhage (HCC)   Seizure-like activity (HCC)   Acute respiratory failure with hypoxia (HCC)  Subjective: No repsonse  Abtx:  Anti-infectives    Start     Dose/Rate Route Frequency Ordered Stop   08/08/17 1700  vancomycin (VANCOCIN) IVPB 1000 mg/200 mL premix  Status:  Discontinued     1,000 mg 200 mL/hr over 60 Minutes Intravenous Every 8 hours 08/08/17 1608 08/11/17 0929   08/06/17 0615  vancomycin (VANCOCIN) IVPB 750 mg/150 ml premix  Status:  Discontinued     750 mg 150 mL/hr over 60 Minutes Intravenous Every 8 hours 08/05/17 2217 08/08/17 1608   08/02/17 2130  vancomycin (VANCOCIN) IVPB 750 mg/150 ml premix  Status:  Discontinued     750 mg 150 mL/hr over 60 Minutes Intravenous Every 12 hours 08/02/17 0902 08/05/17 2217   08/02/17 0930  vancomycin (VANCOCIN) IVPB 1000 mg/200 mL premix     1,000 mg 200 mL/hr over 60 Minutes Intravenous  Once 08/02/17 0902 08/02/17 1116   08/02/17 0930  cefTAZidime (FORTAZ) 2 g in dextrose 5 % 50 mL IVPB     2 g 100 mL/hr over 30 Minutes Intravenous Every 8 hours 08/02/17 0902        Medications:  Scheduled: . chlorhexidine  15 mL Mouth Rinse BID  . feeding supplement (PRO-STAT SUGAR FREE 64)  30 mL Per Tube Daily  . levETIRAcetam  1,000 mg Per Tube BID  . mouth rinse  15 mL Mouth Rinse q12n4p  . multivitamin  15 mL Per Tube Daily  . niMODipine  60 mg Oral Q4H   Or  . NiMODipine  60 mg Per Tube Q4H  . ranitidine  150 mg Oral BID    Objective: Vital signs in last 24 hours: Temp:  [98.9 F (37.2 C)-102.8 F (39.3 C)] 100.6 F (38.1 C) (09/25 0800) Pulse Rate:  [85-120] 120 (09/25 1000) Resp:  [14-24] 18 (09/25 1000) BP: (114-179)/(73-113) 135/91 (09/25 1000) SpO2:  [99 %-100 %] 100 % (09/25 1000) Weight:  [67.7 kg (149 lb 4 oz)] 67.7 kg (149 lb 4 oz) (09/25  0331)   General appearance: no distress Head: drain in place on R occiput Resp: rhonchi anterior - bilateral Cardio: tachycardia GI: normal findings: soft, non-tender and abnormal findings:  distended and hypoactive bowel sounds Extremities: edema none  Lab Results  Recent Labs  08/09/17 0236 08/11/17 0927  WBC 27.9* 21.0*  HGB 8.2* 7.8*  HCT 25.9* 24.6*  NA 137 138  K 3.1* 3.3*  CL 105 107  CO2 23 24  BUN 8 10  CREATININE 0.62 0.59   Liver Panel No results for input(s): PROT, ALBUMIN, AST, ALT, ALKPHOS, BILITOT, BILIDIR, IBILI in the last 72 hours. Sedimentation Rate No results for input(s): ESRSEDRATE in the last 72 hours. C-Reactive Protein No results for input(s): CRP in the last 72 hours.  Microbiology: Recent Results (from the past 240 hour(s))  Culture, blood (Routine X 2) w Reflex to ID Panel     Status: None   Collection Time: 08/02/17 10:30 AM  Result Value Ref Range Status   Specimen Description BLOOD RIGHT HAND  Final   Special Requests   Final    BOTTLES DRAWN AEROBIC ONLY Blood Culture results may not be optimal due to an inadequate volume of blood received in culture bottles   Culture  NO GROWTH 5 DAYS  Final   Report Status 08/07/2017 FINAL  Final  Culture, blood (Routine X 2) w Reflex to ID Panel     Status: None   Collection Time: 08/02/17 10:35 AM  Result Value Ref Range Status   Specimen Description BLOOD RIGHT HAND  Final   Special Requests   Final    BOTTLES DRAWN AEROBIC ONLY Blood Culture adequate volume   Culture NO GROWTH 5 DAYS  Final   Report Status 08/07/2017 FINAL  Final  Culture, Urine     Status: Abnormal   Collection Time: 08/02/17 12:26 PM  Result Value Ref Range Status   Specimen Description URINE, CATHETERIZED  Final   Special Requests NONE  Final   Culture MULTIPLE SPECIES PRESENT, SUGGEST RECOLLECTION (A)  Final   Report Status 08/03/2017 FINAL  Final  C difficile quick scan w PCR reflex     Status: None   Collection  Time: 08/05/17  5:11 PM  Result Value Ref Range Status   C Diff antigen NEGATIVE NEGATIVE Final   C Diff toxin NEGATIVE NEGATIVE Final   C Diff interpretation No C. difficile detected.  Final  CSF culture with Stat gram stain     Status: None   Collection Time: 08/06/17  8:45 AM  Result Value Ref Range Status   Specimen Description CSF  Final   Special Requests RED TOP TUBE SENT  Final   Gram Stain   Final    CYTOSPIN SMEAR WBC PRESENT,BOTH PMN AND MONONUCLEAR NO ORGANISMS SEEN    Culture NO GROWTH 3 DAYS  Final   Report Status 08/09/2017 FINAL  Final  Urine Culture     Status: None   Collection Time: 08/06/17 12:20 PM  Result Value Ref Range Status   Specimen Description URINE, CATHETERIZED  Final   Special Requests NONE  Final   Culture NO GROWTH  Final   Report Status 08/07/2017 FINAL  Final    Studies/Results: Dg Chest Port 1 View  Result Date: 08/10/2017 CLINICAL DATA:  Healthcare associated pneumonia EXAM: PORTABLE CHEST 1 VIEW COMPARISON:  Four days ago FINDINGS: Improved positioning of nasogastric tube, which reaches the stomach. Improved aeration in the lower lungs, now with mild streaky density in both infrahilar regions. No edema, effusion, or pneumothorax. Stable mild cardiomegaly. Stable aortic and hilar contours. Less distended bowel in the left upper quadrant. IMPRESSION: 1. History of pneumonia with significantly improved aeration at the bases. 2. Nasogastric tube in good position. Electronically Signed   By: Marnee Spring M.D.   On: 08/10/2017 07:19     Assessment/Plan: Leukocytosis, Fever RLL pna ICH Ileus  C diff (-) CSF Cx (-) Will stop anbx (treating as if HCAP), 8 days  Continued fever, WBC stable Consider repeat abd films? Suspect combination of central fever, ileus.  Repeat BCx, UCx sent today, also repeat PCT  Total days of antibiotics: 8 vanco/ceftaz--> stop         Johny Sax MD, FACP Infectious Diseases (pager) [phone] [email or web address] 08/11/2017, 11:18 AM  LOS: 12 days

## 2017-08-11 NOTE — Progress Notes (Signed)
SLP Cancellation Note  Patient Details Name: Suzanne Brooks MRN: [id number] DOB: 04-Sep-1976   Cancelled treatment:       Reason Eval/Treat Not Completed: Fatigue/lethargy limiting ability to participate in MBS. Will attempt tomorrow.   Blenda Mounts Laurice 08/11/2017, 2:07 PM

## 2017-08-12 ENCOUNTER — Encounter (HOSPITAL_COMMUNITY): Payer: Self-pay

## 2017-08-12 ENCOUNTER — Inpatient Hospital Stay (HOSPITAL_BASED_OUTPATIENT_CLINIC_OR_DEPARTMENT_OTHER): Payer: Medicaid Other

## 2017-08-12 DIAGNOSIS — I609 Nontraumatic subarachnoid hemorrhage, unspecified: Secondary | ICD-10-CM | POA: Diagnosis not present

## 2017-08-12 LAB — GLUCOSE, CAPILLARY
GLUCOSE-CAPILLARY: 128 mg/dL — AB (ref 65–99)
GLUCOSE-CAPILLARY: 128 mg/dL — AB (ref 65–99)
GLUCOSE-CAPILLARY: 132 mg/dL — AB (ref 65–99)
GLUCOSE-CAPILLARY: 139 mg/dL — AB (ref 65–99)
GLUCOSE-CAPILLARY: 140 mg/dL — AB (ref 65–99)
Glucose-Capillary: 134 mg/dL — ABNORMAL HIGH (ref 65–99)

## 2017-08-12 LAB — URINALYSIS, ROUTINE W REFLEX MICROSCOPIC
Bilirubin Urine: NEGATIVE
GLUCOSE, UA: 50 mg/dL — AB
Hgb urine dipstick: NEGATIVE
Ketones, ur: NEGATIVE mg/dL
Leukocytes, UA: NEGATIVE
Nitrite: NEGATIVE
Protein, ur: NEGATIVE mg/dL
Specific Gravity, Urine: 1.024 (ref 1.005–1.030)
pH: 5 (ref 5.0–8.0)

## 2017-08-12 LAB — CBC
HEMATOCRIT: 25.2 % — AB (ref 36.0–46.0)
Hemoglobin: 7.8 g/dL — ABNORMAL LOW (ref 12.0–15.0)
MCH: 26.5 pg (ref 26.0–34.0)
MCHC: 31 g/dL (ref 30.0–36.0)
MCV: 85.7 fL (ref 78.0–100.0)
PLATELETS: 702 10*3/uL — AB (ref 150–400)
RBC: 2.94 MIL/uL — AB (ref 3.87–5.11)
RDW: 19.7 % — ABNORMAL HIGH (ref 11.5–15.5)
WBC: 21.9 10*3/uL — AB (ref 4.0–10.5)

## 2017-08-12 LAB — BASIC METABOLIC PANEL
Anion gap: 8 (ref 5–15)
BUN: 9 mg/dL (ref 6–20)
CO2: 25 mmol/L (ref 22–32)
Calcium: 8.6 mg/dL — ABNORMAL LOW (ref 8.9–10.3)
Chloride: 104 mmol/L (ref 101–111)
Creatinine, Ser: 0.54 mg/dL (ref 0.44–1.00)
GLUCOSE: 110 mg/dL — AB (ref 65–99)
POTASSIUM: 3 mmol/L — AB (ref 3.5–5.1)
Sodium: 137 mmol/L (ref 135–145)

## 2017-08-12 LAB — URINE CULTURE: CULTURE: NO GROWTH

## 2017-08-12 LAB — MAGNESIUM: Magnesium: 2.1 mg/dL (ref 1.7–2.4)

## 2017-08-12 LAB — PHOSPHORUS: PHOSPHORUS: 3.1 mg/dL (ref 2.5–4.6)

## 2017-08-12 LAB — PROCALCITONIN: Procalcitonin: <0.1 ng/mL

## 2017-08-12 MED ORDER — POTASSIUM CHLORIDE 20 MEQ/15ML (10%) PO SOLN
40.0000 meq | Freq: Two times a day (BID) | ORAL | Status: AC
Start: 2017-08-12 — End: 2017-08-12
  Administered 2017-08-12 (×2): 40 meq
  Filled 2017-08-12 (×2): qty 30

## 2017-08-12 NOTE — Progress Notes (Signed)
Physical Therapy Treatment Patient Details Name: Suzanne Brooks MRN: [id number] DOB: 1976/10/07 Today's Date: 08/12/2017    History of Present Illness Pt is a 41 y/o female who presents with seizure activity. Pt was found to have a subarachnoid hemorrhage, and is now s/p coiling of left ophthalmic aneurysm with EVD on 9/14.PMHx: HTN, Sz    PT Comments    Pt is less aroused and less participiative today than last session.  She had less activiation of bil arms and no spontaneuous movement of left leg today.  She needed more assist to stand.  ventric drain is still in place and output still seems high.  PT will continue to follow acutely to progress mobility.  Goals are due next session.    Follow Up Recommendations  CIR;Supervision/Assistance - 24 hour     Equipment Recommendations  Wheelchair (measurements PT);Wheelchair cushion (measurements PT);Hospital bed;Other (comment) (hoyer lift)    Recommendations for Other Services   NA     Precautions / Restrictions Precautions Precautions: Fall Precaution Comments: EVD (clamp prior to mobility or bed changes) Restrictions Weight Bearing Restrictions: No    Mobility  Bed Mobility Overal bed mobility: Needs Assistance Bed Mobility: Supine to Sit;Sit to Supine     Supine to sit: Total assist;+2 for physical assistance Sit to supine: Total assist;+2 for physical assistance   General bed mobility comments: Pt not initiating movement to assist with bed mobility this session. Assist provided for LEs and trunk supine <> sit.  Transfers Overall transfer level: Needs assistance Equipment used: 2 person hand held assist Transfers: Sit to/from Stand Sit to Stand: Total assist;+2 physical assistance         General transfer comment: Attempted sit to stand from EOB blocking bil knees/feet and with use of bed pad to boost up. Unable to acheive full upright posture and not any left leg activation with stand today when knee block lessened  pt flexed.          Balance Overall balance assessment: Needs assistance Sitting-balance support: Feet supported;Bilateral upper extremity supported Sitting balance-Leahy Scale: Zero Sitting balance - Comments: Max assist for sitting balance thorughout session. No righting or protective recations noted. Postural control: Posterior lean Standing balance support: No upper extremity supported Standing balance-Leahy Scale: Zero Standing balance comment: max to toal standing balance without much active activation from pt.                             Cognition Arousal/Alertness: Lethargic Behavior During Therapy: Flat affect Overall Cognitive Status: Impaired/Different from baseline Area of Impairment: Following commands;Problem solving;Orientation                 Orientation Level:  (able to nod yes/no to ID her name given multiple choices) Current Attention Level: Focused Memory: Decreased recall of precautions Following Commands: Follows one step commands inconsistently;Follows one step commands with increased time Safety/Judgement: Decreased awareness of safety;Decreased awareness of deficits Awareness: Intellectual Problem Solving: Slow processing;Decreased initiation;Requires verbal cues;Requires tactile cues;Difficulty sequencing General Comments: No verbalizations today; pt does nod yes/no correctly to name and incorrect names. Pt occasionally following simple one step commands but not consistenly.      Exercises General Exercises - Lower Extremity Ankle Circles/Pumps: PROM;Both;5 reps Heel Slides: PROM;Both;5 reps    General Comments General comments (skin integrity, edema, etc.): RN concerned about left leg getting tight, so I recommended he rotate the [facility] every 4 hours.  It was on the right  when we entered, so I put it on the left when we were done with the session.       Pertinent Vitals/Pain Pain Assessment: Faces Faces Pain Scale: No hurt            PT Goals (current goals can now be found in the care plan section) Acute Rehab PT Goals Patient Stated Goal: none stated Progress towards PT goals: Not progressing toward goals - comment (more lethargic today)    Frequency    Min 3X/week      PT Plan Current plan remains appropriate    Co-evaluation PT/OT/SLP Co-Evaluation/Treatment: Yes Reason for Co-Treatment: Complexity of the patient's impairments (multi-system involvement);Necessary to address cognition/behavior during functional activity;For patient/therapist safety;To address functional/ADL transfers PT goals addressed during session: Mobility/safety with mobility;Balance;Strengthening/ROM OT goals addressed during session: ADL's and self-care      AM-PAC PT "6 Clicks" Daily Activity  Outcome Measure  Difficulty turning over in bed (including adjusting bedclothes, sheets and blankets)?: Unable Difficulty moving from lying on back to sitting on the side of the bed? : Unable Difficulty sitting down on and standing up from a chair with arms (e.g., wheelchair, bedside commode, etc,.)?: Unable Help needed moving to and from a bed to chair (including a wheelchair)?: Total Help needed walking in hospital room?: Total Help needed climbing 3-5 steps with a railing? : Total 6 Click Score: 6    End of Session Equipment Utilized During Treatment: Gait belt Activity Tolerance: Patient limited by fatigue;Patient limited by lethargy Patient left: in bed;with call bell/phone within reach Nurse Communication: Mobility status PT Visit Diagnosis: Muscle weakness (generalized) (M62.81);Other symptoms and signs involving the nervous system (R29.898);Other abnormalities of gait and mobility (R26.89)     Time: 1610-9604 PT Time Calculation (min) (ACUTE ONLY): 30 min  Charges:  $Therapeutic Activity: 8-22 mins          Suzanne Brooks, PT, DPT [phone]            08/12/2017, 5:49 PM

## 2017-08-12 NOTE — Progress Notes (Signed)
SLP Cancellation Note  Patient Details Name: Shalandria Elsbernd MRN: [id number] DOB: [birth year]   Cancelled treatment:       Reason Eval/Treat Not Completed: Fatigue/lethargy limiting ability to participate - hold on MBS.   Blenda Mounts Laurice 08/12/2017, 1:55 PM

## 2017-08-12 NOTE — Progress Notes (Signed)
Pt seen and examined. No issues overnight. Was noted to be somewhat more lethargic yesterday afternoon.  EXAM: Temp:  [98.2 F (36.8 C)-102.8 F (39.3 C)] 101.8 F (38.8 C) (09/26 0400) Pulse Rate:  [90-120] 92 (09/26 0800) Resp:  [14-20] 15 (09/26 0800) BP: (129-184)/(77-114) 170/104 (09/26 0800) SpO2:  [100 %] 100 % (09/26 0800) Weight:  [65.8 kg (145 lb 1 oz)] 65.8 kg (145 lb 1 oz) (09/26 0405) Intake/Output      09/25 0701 - 09/26 0700 09/26 0701 - 09/27 0700   I.V. (mL/kg) 2400 (36.5) 100 (1.5)   NG/GT 1200 50   IV Piggyback 200    Total Intake(mL/kg) 3800 (57.8) 150 (2.3)   Urine (mL/kg/hr) 2350 (1.5)    Drains 304 11   Stool 350    Total Output 3004 11   Net +796 +139         Easily arousable to voice Nods to answer simple questions, not speaking for me Follows commands BUE, will move LLE  No movement RLL EVD in place, draining  LABS: Lab Results  Component Value Date   CREATININE 0.54 08/12/2017   BUN 9 08/12/2017   NA 137 08/12/2017   K 3.0 (L) 08/12/2017   CL 104 08/12/2017   CO2 25 08/12/2017   Lab Results  Component Value Date   WBC 21.9 (H) 08/12/2017   HGB 7.8 (L) 08/12/2017   HCT 25.2 (L) 08/12/2017   MCV 85.7 08/12/2017   PLT 702 (H) 08/12/2017    IMPRESSION: - 41 y.o. female SAH d# 14 s/p coiling left ophthalmic aneurysm. Stable - Cultures pending, continues to spike fevers. Suspect central origin  PLAN: - Cont supportive care

## 2017-08-12 NOTE — Progress Notes (Addendum)
PULMONARY / CRITICAL CARE MEDICINE   Name: Suzanne Brooks MRN: [id number] DOB: 1976-05-22    ADMISSION DATE:  07/30/2017 CONSULTATION DATE:  07/30/2017  REFERRING MD:  Conchita Paris  CHIEF COMPLAINT:  SAH  HISTORY OF PRESENT ILLNESS:   41 yo female presented with seizures and found to have SAH and Lt frontal lobe ICH.  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 PMHx of HTN.  SUBJECTIVE:  More somnolent today  VITAL SIGNS: BP (!) 170/104   Pulse 92   Temp 99.8 F (37.7 C) (Axillary)   Resp 15   Ht  (1.6 m)   Wt 145 lb 1 oz (65.8 kg)   LMP  (LMP Unknown) Comment: neg preg test 07/30/17  SpO2 100%   BMI 25.70 kg/m   INTAKE / OUTPUT: I/O last 3 completed shifts: In: 6050 [I.V.:3600; NG/GT:1800; IV Piggyback:650] Out: 4892 [Urine:3700; Drains:492; Stool:700]  PHYSICAL EXAMINATION: Blood pressure (!) 170/104, pulse 92, temperature 99.8 F (37.7 C), temperature source Axillary, resp. rate 15, height  (1.6 m), weight 145 lb 1 oz (65.8 kg), SpO2 100 %. Gen:      No acute distress HEENT:  EOMI, sclera anicteric Neck:     No masses; no thyromegaly Lungs:    B/L rhonci; normal respiratory effort CV:         Regular rate and rhythm; no murmurs Abd:      + bowel sounds; soft, non-tender; no palpable masses, no distension Ext:    No edema; adequate peripheral perfusion Skin:      Warm and dry; no rash Neuro: Obtunded, unarousable  LABS:  BMET  Recent Labs Lab 08/09/17 0236 08/11/17 0927 08/12/17 0158  NA 137 138 137  K 3.1* 3.3* 3.0*  CL 105 107 104  CO2 BUN CREATININE 0.62 0.59 0.54  GLUCOSE 123* 150* 110*   Electrolytes  Recent Labs Lab 08/08/17 0258 08/09/17 0236 08/11/17 0927 08/12/17 0158  CALCIUM 8.4* 8.7* 8.7* 8.6*  MG 2.5*  --  2.1 2.1  PHOS 2.2*  --  3.2 3.1   CBC  Recent Labs Lab 08/09/17 0236 08/11/17 0927 08/12/17 0158  WBC 27.9* 21.0* 21.9*  HGB 8.2* 7.8* 7.8*  HCT 25.9* 24.6* 25.2*  PLT 445*  606* 702*   Coag's No results for input(s): APTT, INR in the last 168 hours. Sepsis Markers  Recent Labs Lab 08/11/17 0927 08/12/17 0158  PROCALCITON <0.10 <0.10    Glucose  Recent Labs Lab 08/11/17 1154 08/11/17 1536 08/11/17 1947 08/11/17 2322 08/12/17 0330 08/12/17 0756  GLUCAP 107* 122* 129* 154* 139* 128*   Imaging No results found.   STUDIES:  CT head 9/13 > Extensive subarachnoid hemorrhage throughout the basilar cisterns, sylvian fissure and interhemispheric fissure question ruptured aneurysm. Additional intra cerebral hematoma within the inferior LEFT frontal lobe 3.5 x 1.4 x 1.8 cm, un common to have intraparenchymal extension of subarachnoid hemorrhage, question concomitant or subsequent fall with traumatic hemorrhage? 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  Persistent fever post op.  ASSESSMENT / PLAN: Seizure. SAH, ICH s/p aneurysmal coiling. IVD management per eurosurgery Continue Keppra and nimodipine  Fever with concern for HCAP, but also centrally mediated. Off antibiotics. Got full course for HCAP CSF cultures and Pct are negative   Compromised airway for [facility], ICH. Concern that she may deteriorate from resp standpoint as mental status is worse today Observe in ICU  Hypokalemia.  Replete K  Diarrhea, C diff negative Supportive care  Dysphagia. Speech therapy  DVT prophylaxis - SCDs SUP - zantac Nutrition -  tube feeds Goals of care - full code  The patient is critically ill with multiple organ system failure and requires high complexity decision making for assessment and support, frequent evaluation and titration of therapies, advanced monitoring, review of radiographic studies and interpretation of complex data.   Critical Care Time devoted to patient care services, exclusive of separately billable procedures, described in this note is 35 minutes.   Chilton Greathouse MD [facility] Pager [phone] If no answer or after 3pm call: [phone] 08/12/2017, 9:14 AM

## 2017-08-12 NOTE — Progress Notes (Signed)
Transcranial Doppler  Date POD PCO2 HCT BP  MCA ACA PCA OPHT SIPH VERT Basilar  9/14 vs     Right  Left   49  44   -14  -44   21  37   -30  -35     -37    9/17 je     Right  Left   79  143   -45  -67   12  32   31  19   109  42   -60  -37   -54      9/19rs     Right  Left   164  181   -38  -71   48  66   29  26   59  46   -31  *   *      9/21rs      Right  Left   113  166   -35  -98   40  49  45   -42  -38   -90      9/24rs      Right  Left   191  188   -38  -39   37  25   39  28   33  36   -71  -49   -52      9/26 rs     Right  Left   203  164   -40  -44   72  52   46  36   37  177   -49  -80   -67           Right  Left                                        MCA = Middle Cerebral Artery      OPHT = Opthalmic Artery     BASILAR = Basilar Artery   ACA = Anterior Cerebral Artery     SIPH = Carotid Siphon PCA = Posterior Cerebral Artery   VERT = Verterbral Artery                   Normal MCA = 62+\-12 ACA = 50+\-12 PCA = 42+\-23   9/17 [place] ratio: right 4.4 left 4.6. je 9/19 - [place] ratio: right 3.4, left 6.0. (*) not insonated. RDS 9/21 - [place]  Ratio: right 3.8, left 7.2 - rds 9/24 - [place] Ratio: right, 4.7. Left 6.7 - rds 9/26 - [place] Ratio: right 6.5, left 6.5 - rds

## 2017-08-12 NOTE — Progress Notes (Signed)
Occupational Therapy Treatment Patient Details Name: Suzanne Brooks MRN: [id number] DOB: Mar 11, 1976 Today's Date: 08/12/2017    History of present illness Pt is a 41 y/o female who presents with seizure activity. Pt was found to have a subarachnoid hemorrhage, and is now s/p coiling of left ophthalmic aneurysm with EVD on 9/14.PMHx: HTN, Sz   OT comments  Pt with increased lethargy and poor cognition today. Pt minimally following simple one step command and responding to orientation questions with head nods only. Pt required max assist for grooming task in sitting to facilitate activity and for sitting balance. Pt tolerated sitting EOB x10 minutes with max posterior support; no righting or protective reactions noted. D/c plan remains appropriate. Will continue to follow acutely.   Follow Up Recommendations  CIR;Supervision/Assistance - 24 hour    Equipment Recommendations  Other (comment) (TBD at next venue)    Recommendations for Other Services      Precautions / Restrictions Precautions Precautions: Fall Precaution Comments: EVD Restrictions Weight Bearing Restrictions: No       Mobility Bed Mobility Overal bed mobility: Needs Assistance Bed Mobility: Supine to Sit;Sit to Supine     Supine to sit: Total assist;+2 for physical assistance Sit to supine: Total assist;+2 for physical assistance   General bed mobility comments: Pt not initiating movement to assist with bed mobility this session. Assist provided for LEs and trunk supine <> sit.  Transfers Overall transfer level: Needs assistance Equipment used: 2 person hand held assist Transfers: Sit to/from Stand Sit to Stand: Total assist;+2 physical assistance         General transfer comment: Attempted sit to stand from EOB blocking bil knees/feet and with use of bed pad to boost up. Unable to acheive full upright posture.    Balance Overall balance assessment: Needs assistance Sitting-balance support: Feet  supported;Bilateral upper extremity supported Sitting balance-Leahy Scale: Zero Sitting balance - Comments: Max assist for sitting balance thorughout session. No righting or protective recations noted. Postural control: Posterior lean                                 ADL either performed or assessed with clinical judgement   ADL Overall ADL's : Needs assistance/impaired     Grooming: Maximal assistance;Sitting;Wash/dry face Grooming Details (indicate cue type and reason): Pt able to wipe R eye and mouth to command with use of R hand. Hand over hand assist for wiping L eye and for use of L hand                               General ADL Comments: Total assist +2 for attempted sit to stand at EOB. Pt unable to acheive full upright posture.     Vision       Perception     Praxis      Cognition Arousal/Alertness: Lethargic Behavior During Therapy: Flat affect Overall Cognitive Status: Impaired/Different from baseline Area of Impairment: Following commands;Problem solving;Orientation                 Orientation Level:  (able to nod yes/no correctly to her name and wrong names)     Following Commands: Follows one step commands inconsistently;Follows one step commands with increased time     Problem Solving: Slow processing;Decreased initiation;Requires verbal cues;Requires tactile cues General Comments: No verbalizations today; pt does nod yes/no correctly to name and  incorrect names. Pt occasionally following simple one step commands but not consistenly.        Exercises     Shoulder Instructions       General Comments      Pertinent Vitals/ Pain       Pain Assessment: Faces Faces Pain Scale: No hurt  Home Living                                          Prior Functioning/Environment              Frequency  Min 3X/week        Progress Toward Goals  OT Goals(current goals can now be found in the care  plan section)  Progress towards OT goals: Not progressing toward goals - comment (increased lethargy)  Acute Rehab OT Goals Patient Stated Goal: none stated OT Goal Formulation: With patient  Plan Discharge plan remains appropriate    Co-evaluation    PT/OT/SLP Co-Evaluation/Treatment: Yes Reason for Co-Treatment: Complexity of the patient's impairments (multi-system involvement);Necessary to address cognition/behavior during functional activity;For patient/therapist safety   OT goals addressed during session: ADL's and self-care      AM-PAC PT "6 Clicks" Daily Activity     Outcome Measure   Help from another person eating meals?: Total Help from another person taking care of personal grooming?: A Lot Help from another person toileting, which includes using toliet, bedpan, or urinal?: Total Help from another person bathing (including washing, rinsing, drying)?: Total Help from another person to put on and taking off regular upper body clothing?: Total Help from another person to put on and taking off regular lower body clothing?: Total 6 Click Score: 7    End of Session Equipment Utilized During Treatment: Gait belt  OT Visit Diagnosis: Muscle weakness (generalized) (M62.81);Other abnormalities of gait and mobility (R26.89)   Activity Tolerance Patient tolerated treatment well   Patient Left in bed;with call bell/phone within reach;with nursing/sitter in room;with SCD's reapplied   Nurse Communication Mobility status        Time: 7253-6644 OT Time Calculation (min): 29 min  Charges: OT General Charges $OT Visit: 1 Visit OT Treatments $Self Care/Home Management : 8-22 mins  Suzanne Brooks, M.S., OTR/L Pager: [phone]   Suzanne Brooks 08/12/2017, 4:11 PM

## 2017-08-12 NOTE — Progress Notes (Signed)
Nutrition Follow-up  INTERVENTION:   Continue Osmolite 1.5 @ 50 mL/hr (1200 mL daily) Continue 30 mL Prostat po once a day Tube feeding and Prostat regimen provides 1900 kcal, 90 grams of protein, and 914 ml of H2O.    NUTRITION DIAGNOSIS:   Inadequate oral intake related to inability to eat as evidenced by NPO status. Ongoing.   GOAL:   Patient will meet greater than or equal to 90% of their needs Met.   MONITOR:   TF tolerance, Weight trends, Labs  ASSESSMENT:   41 year old female who presented to ED with seizures found to be hypertensive with aneurysmal SAH, ICH. Coiled in IR 9/13. Post-op to ICU on vent.   Pt discussed during ICU rounds and with RN.   9/14 IVD placed 304 ml x 24 hrs 9/16 NG tube placed last replaced 9/20  Pt has been unable to pass swallow eval, per SLP pt likely too lethargic today for eval Medications reviewed and include: MVI, 40 mEq KCl BID Labs reviewed: K+ 3 (L) CBG's: 139-128-140  Weight up from 138 lb to 145 lb, pt is 12.7 L positive  Diet Order:    NPO  Skin:  Wound (see comment) (R groin incision from 9/13)  Last BM:  200 ml day  Height:   Ht Readings from Last 1 Encounters:  07/30/17 5' 3"  (1.6 m)    Weight:   Wt Readings from Last 1 Encounters:  08/12/17 145 lb 1 oz (65.8 kg)    Ideal Body Weight:  52.2 kg  BMI:  Body mass index is 25.7 kg/m.  Estimated Nutritional Needs:   Kcal:  1880-2070 (30-33 kcal/kg)  Protein:  82-94 grams (1.3-1.5 grams/kg)  Fluid:  > 1.5 L/day  EDUCATION NEEDS:   No education needs identified at this time  [place], [place], [place] Pager [phone] After Hours Pager

## 2017-08-13 ENCOUNTER — Inpatient Hospital Stay (HOSPITAL_COMMUNITY): Payer: Medicaid Other

## 2017-08-13 LAB — GLUCOSE, CAPILLARY
GLUCOSE-CAPILLARY: 135 mg/dL — AB (ref 65–99)
Glucose-Capillary: 147 mg/dL — ABNORMAL HIGH (ref 65–99)
Glucose-Capillary: 151 mg/dL — ABNORMAL HIGH (ref 65–99)
Glucose-Capillary: 144 mg/dL — ABNORMAL HIGH (ref 65–99)
Glucose-Capillary: 110 mg/dL — ABNORMAL HIGH (ref 65–99)
Glucose-Capillary: 150 mg/dL — ABNORMAL HIGH (ref 65–99)

## 2017-08-13 LAB — BASIC METABOLIC PANEL
Anion gap: 8 (ref 5–15)
BUN: 10 mg/dL (ref 6–20)
CHLORIDE: 109 mmol/L (ref 101–111)
CO2: 25 mmol/L (ref 22–32)
CREATININE: 0.58 mg/dL (ref 0.44–1.00)
Calcium: 8.9 mg/dL (ref 8.9–10.3)
GFR calc Af Amer: >60 mL/min (ref >60)
GFR calc non Af Amer: >60 mL/min (ref >60)
GLUCOSE: 133 mg/dL — AB (ref 65–99)
Potassium: 4 mmol/L (ref 3.5–5.1)
SODIUM: 142 mmol/L (ref 135–145)

## 2017-08-13 LAB — CBC
HEMATOCRIT: 27.3 % — AB (ref 36.0–46.0)
HEMOGLOBIN: 8.3 g/dL — AB (ref 12.0–15.0)
MCH: 26.5 pg (ref 26.0–34.0)
MCHC: 30.4 g/dL (ref 30.0–36.0)
MCV: 87.2 fL (ref 78.0–100.0)
Platelets: 745 10*3/uL — ABNORMAL HIGH (ref 150–400)
RBC: 3.13 MIL/uL — ABNORMAL LOW (ref 3.87–5.11)
RDW: 20 % — ABNORMAL HIGH (ref 11.5–15.5)
WBC: 21.4 10*3/uL — ABNORMAL HIGH (ref 4.0–10.5)

## 2017-08-13 LAB — MAGNESIUM: Magnesium: 2.2 mg/dL (ref 1.7–2.4)

## 2017-08-13 LAB — PROCALCITONIN: Procalcitonin: <0.1 ng/mL

## 2017-08-13 LAB — PHOSPHORUS: PHOSPHORUS: 2.8 mg/dL (ref 2.5–4.6)

## 2017-08-13 MED ORDER — SODIUM CHLORIDE 0.9 % IV SOLN
0.0000 ug/min | INTRAVENOUS | Status: DC
  Administered 2017-08-13: 130 ug/min via INTRAVENOUS
  Administered 2017-08-13: 25 ug/min via INTRAVENOUS
  Filled 2017-08-13: qty 1
  Filled 2017-08-13: qty 10
  Filled 2017-08-13 (×2): qty 1

## 2017-08-13 MED ORDER — SODIUM CHLORIDE 0.9% FLUSH
10.0000 mL | Freq: Two times a day (BID) | INTRAVENOUS | Status: DC
  Administered 2017-08-14 – 2017-08-24 (×19): 10 mL

## 2017-08-13 MED ORDER — SODIUM CHLORIDE 0.9 % IV SOLN
0.0000 ug/min | INTRAVENOUS | Status: DC
  Administered 2017-08-13: 140 ug/min via INTRAVENOUS
  Administered 2017-08-14: 400 ug/min via INTRAVENOUS
  Administered 2017-08-14: 185 ug/min via INTRAVENOUS
  Administered 2017-08-14: 300 ug/min via INTRAVENOUS
  Administered 2017-08-14: 250 ug/min via INTRAVENOUS
  Administered 2017-08-14: 300 ug/min via INTRAVENOUS
  Administered 2017-08-14: 250 ug/min via INTRAVENOUS
  Administered 2017-08-14: 300 ug/min via INTRAVENOUS
  Administered 2017-08-14: 250 ug/min via INTRAVENOUS
  Administered 2017-08-15: 400 ug/min via INTRAVENOUS
  Filled 2017-08-13 (×12): qty 4

## 2017-08-13 MED ORDER — CHLORHEXIDINE GLUCONATE CLOTH 2 % EX PADS
6.0000 | MEDICATED_PAD | Freq: Every day | CUTANEOUS | Status: DC
  Administered 2017-08-13 – 2017-08-24 (×11): 6 via TOPICAL

## 2017-08-13 MED ORDER — SODIUM CHLORIDE 0.9% FLUSH: 10.0000 mL | INTRAVENOUS | Status: DC | PRN

## 2017-08-13 MED ORDER — IOPAMIDOL (ISOVUE-370) INJECTION 76%
INTRAVENOUS | Status: AC
  Administered 2017-08-13: 50 mL
  Filled 2017-08-13: qty 50

## 2017-08-13 NOTE — Progress Notes (Signed)
Pt seen and examined. No issues overnight. Pt noted to be more lethargic this am.  EXAM: Temp:  [98.2 F (36.8 C)-100.6 F (38.1 C)] 100.6 F (38.1 C) (09/27 1638) Pulse Rate:  [102-126] 124 (09/27 1600) Resp:  [15-27] 21 (09/27 1600) BP: (121-187)/(77-116) 178/106 (09/27 1600) SpO2:  [98 %-100 %] 100 % (09/27 1600) Weight:  [64.7 kg (142 lb 10.2 oz)] 64.7 kg (142 lb 10.2 oz) (09/27 0413) Intake/Output      09/26 0701 - 09/27 0700 09/27 0701 - 09/28 0700   I.V. (mL/kg) 2400 (37.1) 1196.9 (18.5)   NG/GT 1200 450   Total Intake(mL/kg) 3600 (55.6) 1646.9 (25.5)   Urine (mL/kg/hr) 1225 (0.8) 550 (0.9)   Drains 273 81   Stool 600    Total Output 2098 631   Net +1502 +1015.9         Opens eyes to noxious stimuli Not following commands Moves BUE to pain, spontaneously, but not following commands Minimal movement LLE, no movement RLE EVD in place, clear slightly xanthochromic CSF. Open at 5.  LABS: Lab Results  Component Value Date   CREATININE 0.58 08/13/2017   BUN 10 08/13/2017   NA 142 08/13/2017   K 4.0 08/13/2017   CL 109 08/13/2017   CO2 25 08/13/2017   Lab Results  Component Value Date   WBC 21.4 (H) 08/13/2017   HGB 8.3 (L) 08/13/2017   HCT 27.3 (L) 08/13/2017   MCV 87.2 08/13/2017   PLT 745 (H) 08/13/2017    IMAGING: TCD velocities yesterday reviewed, increased bilateral MCA and left ICA.  IMPRESSION: - 41 y.o. female SAH d# 15 s/p coiling left ophthalmic aneurysm, more somnolent today. Concern for symptomatic spasm given increased TCD velocities - Febrile, cultures remain negative  PLAN: - NeoSynephrine added for goal SBP > 180 mmHg - IVF to total of 175cc/hr - Will get CTA brain

## 2017-08-13 NOTE — Progress Notes (Signed)
  Speech Language Pathology Treatment: Dysphagia  Patient Details Name: Suzanne Brooks MRN: [id number] DOB: 1975/12/07 Today's Date: 08/13/2017 Time: 4098-1191 SLP Time Calculation (min) (ACUTE ONLY): 8 min  Assessment / Plan / Recommendation Clinical Impression  Lethargy continues to be primary barrier to progression with MBS/initiate of po diet at this time. Despite max verbal, tactile, and visual cueing including repositioning and oral care, patient unable to arouse adequately. Accepted one ice chip with slow oral manipulation and suspected delayed swallow initiation. Patient non-verbal today therefore unable to assess vocal quality however aspiration risk severe given current mentation/alertness. Will continue to f/u.    HPI HPI: Pt is a 41 y/o female who presents with seizure activity. 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 ETT 9/13-9/15.       SLP Plan  Continue with current plan of care       Recommendations  Diet recommendations: NPO Medication Administration: Via alternative means                Oral Care Recommendations: Oral care QID Follow up Recommendations: Inpatient Rehab SLP Visit Diagnosis: Dysphagia, oropharyngeal phase (R13.12) Plan: Continue with current plan of care       GO             [facility] MA, CCC-SLP [phone]    Suzanne Brooks Suzanne Brooks 08/13/2017, 9:54 AM

## 2017-08-13 NOTE — Progress Notes (Signed)
Peripherally Inserted Central Catheter/Midline Placement  The IV Nurse has discussed with the patient and/or persons authorized to consent for the patient, the purpose of this procedure and the potential benefits and risks involved with this procedure.  The benefits include less needle sticks, lab draws from the catheter, and the patient may be discharged home with the catheter. Risks include, but not limited to, infection, bleeding, blood clot (thrombus formation), and puncture of an artery; nerve damage and irregular heartbeat and possibility to perform a PICC exchange if needed/ordered by physician.  Alternatives to this procedure were also discussed.  Bard Power PICC patient education guide, fact sheet on infection prevention and patient information card has been provided to patient /or left at bedside.    PICC/Midline Placement Documentation  PICC Double Lumen 08/13/17 PICC Right Basilic 41 cm 2 cm (Active)  Dressing Change Due 08/20/17 08/13/2017  7:00 PM       Tonna Boehringer 08/13/2017, 7:16 PM

## 2017-08-13 NOTE — Progress Notes (Signed)
PULMONARY / CRITICAL CARE MEDICINE   Name: Suzanne Brooks MRN: [id number] DOB: 07/24/1976    ADMISSION DATE:  07/30/2017 CONSULTATION DATE:  07/30/2017  REFERRING MD:  Conchita Paris  CHIEF COMPLAINT:  SAH  HISTORY OF PRESENT ILLNESS:   41 yo female presented with seizures and found to have SAH and Lt frontal lobe ICH.  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 PMHx of HTN.  SUBJECTIVE:  Tmax 100.4 PCT <0.10 Remains on room air Per RN, remains somnolent since yesterday, able to still follow simple commands  VITAL SIGNS: BP (!) 142/101   Pulse (!) 110   Temp (!) 100.4 F (38 C) (Axillary) Comment: Simultaneous filing. User may not have seen previous data.  Resp (!) 25   Ht  (1.6 m)   Wt 142 lb 10.2 oz (64.7 kg)   LMP  (LMP Unknown) Comment: neg preg test 07/30/17  SpO2 100%   BMI 25.27 kg/m   INTAKE / OUTPUT: I/O last 3 completed shifts: In: 5400 [I.V.:3600; NG/GT:1800] Out: 4410 [Urine:3025; Drains:435; Stool:950]  PHYSICAL EXAMINATION: General:  Critically ill appearing female lying in bed HEENT: MM pink/moist, slight drooling, NGT left nare, pupils 4/reactive, EVD in place Neuro: somnolent, opens eyes to voice briefly, follows simple commands L>R, spont moves extremities except RLE CV:  rrr, no m/r/g PULM: even/non-labored, scattered rhonchi, diminished in bases GI: soft, bs active  Extremities: warm/dry, no edema Skin: no rashes  LABS:  BMET  Recent Labs Lab 08/11/17 0927 08/12/17 0158 08/13/17 0236  NA 138 137 142  K 3.3* 3.0* 4.0  CL 107 104 109  CO2 BUN CREATININE 0.59 0.54 0.58  GLUCOSE 150* 110* 133*   Electrolytes  Recent Labs Lab 08/11/17 0927 08/12/17 0158 08/13/17 0236  CALCIUM 8.7* 8.6* 8.9  MG 2.1 2.1 2.2  PHOS 3.2 3.1 2.8   CBC  Recent Labs Lab 08/11/17 0927 08/12/17 0158 08/13/17 0236  WBC 21.0* 21.9* 21.4*  HGB 7.8* 7.8* 8.3*  HCT 24.6* 25.2* 27.3*  PLT 606* 702* 745*    Coag's No results for input(s): APTT, INR in the last 168 hours. Sepsis Markers  Recent Labs Lab 08/11/17 0927 08/12/17 0158 08/13/17 0236  PROCALCITON <0.10 <0.10 <0.10    Glucose  Recent Labs Lab 08/12/17 0756 08/12/17 1111 08/12/17 1614 08/12/17 1932 08/12/17 2346 08/13/17 0344  GLUCAP 128* 140* 134* 132* 128* 135*   Imaging No results found.   STUDIES:  CT head 9/13 > Extensive subarachnoid hemorrhage throughout the basilar cisterns, sylvian fissure and interhemispheric fissure question ruptured aneurysm. Additional intra cerebral hematoma within the inferior LEFT frontal lobe 3.5 x 1.4 x 1.8 cm, un common to have intraparenchymal extension of subarachnoid hemorrhage, question concomitant or subsequent fall with traumatic hemorrhage? 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  Persistent fever post op.  ASSESSMENT / PLAN: Seizure. SAH, ICH s/p aneurysmal coiling s/p day # 15 EVD management  per Neurosurgery Continue Keppra and nimodipine Trend TCDs- ? Increased vasospasm BP goals per NSGY  Fever with concern for HCAP, but also centrally mediated. Recieved full course abx for HCAP, x10 days completed 9/25 CSF cultures and Pct are negative Monitor off antibiotics F/u CXR today   Compromised airway for [facility], ICH. Remains high risk for intubation due to decreased mental status Observe in ICU  Hypokalemia.   Trend BMP  Replace as indicated  Diarrhea, C diff negative Supportive care  Dysphagia. Speech therapy  DVT prophylaxis - SCDs SUP - zantac Nutrition - tube feeds Goals of care - full code  CCT 30 mins  Posey Boyer, AGACNP-BC [facility] Pgr: [phone] or if no answer [phone] 08/13/2017, 7:37 AM

## 2017-08-14 ENCOUNTER — Inpatient Hospital Stay (HOSPITAL_BASED_OUTPATIENT_CLINIC_OR_DEPARTMENT_OTHER): Payer: Medicaid Other

## 2017-08-14 DIAGNOSIS — I609 Nontraumatic subarachnoid hemorrhage, unspecified: Secondary | ICD-10-CM | POA: Diagnosis not present

## 2017-08-14 LAB — BASIC METABOLIC PANEL
Anion gap: 11 (ref 5–15)
BUN: 12 mg/dL (ref 6–20)
CALCIUM: 9.1 mg/dL (ref 8.9–10.3)
CO2: 25 mmol/L (ref 22–32)
CREATININE: 0.52 mg/dL (ref 0.44–1.00)
Chloride: 106 mmol/L (ref 101–111)
Glucose, Bld: 150 mg/dL — ABNORMAL HIGH (ref 65–99)
Potassium: 3.4 mmol/L — ABNORMAL LOW (ref 3.5–5.1)
SODIUM: 142 mmol/L (ref 135–145)

## 2017-08-14 LAB — GLUCOSE, CAPILLARY
GLUCOSE-CAPILLARY: 108 mg/dL — AB (ref 65–99)
GLUCOSE-CAPILLARY: 120 mg/dL — AB (ref 65–99)
GLUCOSE-CAPILLARY: 142 mg/dL — AB (ref 65–99)
Glucose-Capillary: 116 mg/dL — ABNORMAL HIGH (ref 65–99)
Glucose-Capillary: 121 mg/dL — ABNORMAL HIGH (ref 65–99)
Glucose-Capillary: 109 mg/dL — ABNORMAL HIGH (ref 65–99)

## 2017-08-14 LAB — CBC
HCT: 24.3 % — ABNORMAL LOW (ref 36.0–46.0)
Hemoglobin: 7.5 g/dL — ABNORMAL LOW (ref 12.0–15.0)
MCH: 27 pg (ref 26.0–34.0)
MCHC: 30.9 g/dL (ref 30.0–36.0)
MCV: 87.4 fL (ref 78.0–100.0)
PLATELETS: 781 10*3/uL — AB (ref 150–400)
RBC: 2.78 MIL/uL — AB (ref 3.87–5.11)
RDW: 20.2 % — ABNORMAL HIGH (ref 11.5–15.5)
WBC: 21.9 10*3/uL — AB (ref 4.0–10.5)

## 2017-08-14 LAB — PHOSPHORUS: PHOSPHORUS: 3.4 mg/dL (ref 2.5–4.6)

## 2017-08-14 LAB — MAGNESIUM: MAGNESIUM: 2.2 mg/dL (ref 1.7–2.4)

## 2017-08-14 MED ORDER — POTASSIUM CHLORIDE 20 MEQ/15ML (10%) PO SOLN
40.0000 meq | Freq: Two times a day (BID) | ORAL | Status: AC
  Administered 2017-08-14 (×2): 40 meq
  Filled 2017-08-14 (×2): qty 30

## 2017-08-14 MED ORDER — SODIUM CHLORIDE 0.9 % IV SOLN: INTRAVENOUS | Status: DC | PRN

## 2017-08-14 NOTE — Progress Notes (Signed)
MD notified of patient blood pressure being below goal and maxed out on presser. RN will continue to monitor.

## 2017-08-14 NOTE — Progress Notes (Addendum)
Physical Therapy Treatment Patient Details Name: Suzanne Brooks MRN: [id number] DOB: 1976-08-30 Today's Date: 08/14/2017    History of Present Illness Pt is a 41 y/o female who presents with seizure activity. 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  Following 2-3 one step commands the whole session and generally needing max to total assist for all EOB mobility.  She is not as active with her left leg as she has been.  PT will continue to follow acutely.    Follow Up Recommendations  CIR;Supervision/Assistance - 24 hour     Equipment Recommendations  Wheelchair (measurements PT);Wheelchair cushion (measurements PT);Hospital bed;Other (comment) (hoyer lift)    Recommendations for Other Services Rehab consult     Precautions / Restrictions Precautions Precautions: Fall Precaution Comments: clamp EVD prior to moving the bed/mobilizing pt.  Required Braces or Orthoses: Other Brace/Splint Other Brace/Splint: A line left wrist without splint this session, so tried to limit left arm prop and kept hand in lap Restrictions Weight Bearing Restrictions: No    Mobility  Bed Mobility Overal bed mobility: Needs Assistance Bed Mobility: Supine to Sit;Sit to Supine     Supine to sit: Total assist;+2 for physical assistance Sit to supine: Total assist;+2 for physical assistance   General bed mobility comments: Pt not initiating movement to assist with bed mobility this session. Assist provided for LEs and trunk supine <> sit.  She was not very arousable until she was sitting.   Transfers                 General transfer comment: Did not attempt today given the significant assist needed EOB just to sit and not much active movment seen in LEs (trace in left, none in right).       Modified Rankin (Stroke Patients  Only) Modified Rankin (Stroke Patients Only) Pre-Morbid Rankin Score: No symptoms Modified Rankin: Severe disability     Balance Overall balance assessment: Needs assistance Sitting-balance support: Feet supported;Bilateral upper extremity supported Sitting balance-Leahy Scale: Zero Sitting balance - Comments: Max assist for sitting balance. Pt pushing posteriorly intermittently throghout session.  Pt also pushing posteriorly when asked to follow a command seemingly indicating that she was trying.  Postural control: Posterior lean (posterior push)                                  Cognition Arousal/Alertness: Lethargic Behavior During Therapy: Flat affect Overall Cognitive Status: Impaired/Different from baseline Area of Impairment: Following commands;Problem solving                   Current Attention Level: Focused   Following Commands: Follows one step commands inconsistently;Follows one step commands with increased time Safety/Judgement: Decreased awareness of safety;Decreased awareness of deficits Awareness: Intellectual Problem Solving: Slow processing;Decreased initiation;Difficulty sequencing;Requires verbal cues;Requires tactile cues General Comments: Pt mumbling at times, minimal verbalizations. Able to give thumbus up and squeeze L hand x1 each with time given to process      Exercises General Exercises - Lower Extremity Ankle Circles/Pumps: PROM;Both;10 reps Heel Slides: PROM;Both;10 reps Hip ABduction/ADduction: PROM;Both;10 reps Other Exercises Other Exercises: at end of session positioned on her side (opposite side of how we found her) and switched her PRAFO to the other foot.  General Comments General comments (skin integrity, edema, etc.): BPs 160s-180s throughout session with 110s diastolic, per neurosurgery notes they want her systolic to be >180 at this time.       Pertinent Vitals/Pain Pain Assessment: Faces Faces Pain Scale: No  hurt           PT Goals (current goals can now be found in the care plan section) Acute Rehab PT Goals Patient Stated Goal: none stated PT Goal Formulation: Patient unable to participate in goal setting Time For Goal Achievement: 08/28/17 Potential to Achieve Goals: Fair Progress towards PT goals: Progressing toward goals (goals updated)    Frequency    Min 3X/week      PT Plan Current plan remains appropriate    Co-evaluation PT/OT/SLP Co-Evaluation/Treatment: Yes Reason for Co-Treatment: Complexity of the patient's impairments (multi-system involvement);Necessary to address cognition/behavior during functional activity;For patient/therapist safety;To address functional/ADL transfers PT goals addressed during session: Mobility/safety with mobility;Balance;Strengthening/ROM OT goals addressed during session: ADL's and self-care      AM-PAC PT "6 Clicks" Daily Activity  Outcome Measure  Difficulty turning over in bed (including adjusting bedclothes, sheets and blankets)?: Unable Difficulty moving from lying on back to sitting on the side of the bed? : Unable Difficulty sitting down on and standing up from a chair with arms (e.g., wheelchair, bedside commode, etc,.)?: Unable Help needed moving to and from a bed to chair (including a wheelchair)?: Total Help needed walking in hospital room?: Total Help needed climbing 3-5 steps with a railing? : Total 6 Click Score: 6    End of Session   Activity Tolerance: Patient limited by fatigue;Patient limited by lethargy Patient left: in bed;with call bell/phone within reach Nurse Communication: Mobility status PT Visit Diagnosis: Muscle weakness (generalized) (M62.81);Other symptoms and signs involving the nervous system (R29.898);Other abnormalities of gait and mobility (R26.89)     Time: 1610-9604 PT Time Calculation (min) (ACUTE ONLY): 41 min  Charges:  $Therapeutic Activity: 23-37 mins          Eugenia Eldredge B. Briann Sarchet, PT,  DPT [phone]            08/14/2017, 6:17 PM

## 2017-08-14 NOTE — Procedures (Signed)
Arterial Catheter Insertion Procedure Note Breckin Zafar [id number] 07-28-1976  Procedure: Insertion of Arterial Catheter  Indications: Blood pressure monitoring  Procedure Details Consent: Unable to obtain consent because of altered level of consciousness. Time Out: Verified patient identification, verified procedure, site/side was marked, verified correct patient position, special equipment/implants available, medications/allergies/relevent history reviewed, required imaging and test results available.  Performed  Maximum sterile technique was used including antiseptics, cap, gloves, gown, hand hygiene, mask and sheet. Skin prep: Chlorhexidine; local anesthetic administered 20 gauge catheter was inserted into left radial artery using the Seldinger technique.  Evaluation Blood flow good; BP tracing good. Complications: No apparent complications.   Rayburn Felt 08/14/2017

## 2017-08-14 NOTE — Progress Notes (Signed)
PT Cancellation Note  Patient Details Name: Suzanne Brooks MRN: [id number] DOB: 1976-02-26   Cancelled Treatment:    Reason Eval/Treat Not Completed: Patient at procedure or test/unavailable PT to try to check back later as time allows.   Thanks,   Rollene Rotunda. Brion Sossamon, PT, DPT [phone]   08/14/2017, 12:10 PM

## 2017-08-14 NOTE — Progress Notes (Signed)
Occupational Therapy Treatment Patient Details Name: Suzanne Brooks MRN: [id number] DOB: [birth year] Today's Date: 08/14/2017    History of present illness Pt is a 41 y/o female who presents with seizure activity. 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 Pt tolerated sitting EOB x10 minutes with max assist and VSS throughout. Pt continues to demonstrate poor trunk control and posterior pushing at times. D/c plan remains appropriate. Will continue to follow acutely.   Follow Up Recommendations  CIR;Supervision/Assistance - 24 hour    Equipment Recommendations  Other (comment) (TBD at next venue)    Recommendations for Other Services      Precautions / Restrictions Precautions Precautions: Fall Precaution Comments: EVD Restrictions Weight Bearing Restrictions: No       Mobility Bed Mobility Overal bed mobility: Needs Assistance Bed Mobility: Supine to Sit;Sit to Supine     Supine to sit: Total assist;+2 for physical assistance Sit to supine: Total assist;+2 for physical assistance   General bed mobility comments: Pt not initiating movement to assist with bed mobility this session. Assist provided for LEs and trunk supine <> sit.  Transfers                      Balance Overall balance assessment: Needs assistance Sitting-balance support: Feet supported;Bilateral upper extremity supported Sitting balance-Leahy Scale: Zero Sitting balance - Comments: Max assist for sitting balance. Pt pushing posteriorly intermittently throghout session Postural control: Posterior lean                                 ADL either performed or assessed with clinical judgement   ADL Overall ADL's : Needs assistance/impaired                                        General ADL Comments: Tolerated sitting EOB x10 minutes with VSS throughout. Occasionally visually attending to therapist and following commands x2 during entire session.     Vision       Perception     Praxis      Cognition Arousal/Alertness: Lethargic Behavior During Therapy: Flat affect Overall Cognitive Status: Impaired/Different from baseline Area of Impairment: Following commands;Problem solving                       Following Commands: Follows one step commands inconsistently;Follows one step commands with increased time     Problem Solving: Slow processing;Decreased initiation;Difficulty sequencing;Requires verbal cues;Requires tactile cues General Comments: Pt mumbling at times, minimal verbalizations. Able to give thumbus up and squeeze L hand x1 each         Exercises     Shoulder Instructions       General Comments BP 160s-180s throughout session.    Pertinent Vitals/ Pain       Pain Assessment: Faces Faces Pain Scale: No hurt  Home Living                                          Prior Functioning/Environment  Frequency  Min 2X/week        Progress Toward Goals  OT Goals(current goals can now be found in the care plan section)  Progress towards OT goals: Not progressing toward goals - comment (increased lethargy)  Acute Rehab OT Goals Patient Stated Goal: none stated OT Goal Formulation: With patient  Plan Discharge plan remains appropriate;Frequency needs to be updated    Co-evaluation    PT/OT/SLP Co-Evaluation/Treatment: Yes Reason for Co-Treatment: Complexity of the patient's impairments (multi-system involvement);Necessary to address cognition/behavior during functional activity;For patient/therapist safety   OT goals addressed during session: ADL's and self-care      AM-PAC PT "6 Clicks" Daily Activity     Outcome Measure   Help from another person eating meals?: Total Help from  another person taking care of personal grooming?: Total Help from another person toileting, which includes using toliet, bedpan, or urinal?: Total Help from another person bathing (including washing, rinsing, drying)?: Total Help from another person to put on and taking off regular upper body clothing?: Total Help from another person to put on and taking off regular lower body clothing?: Total 6 Click Score: 6    End of Session    OT Visit Diagnosis: Muscle weakness (generalized) (M62.81);Other abnormalities of gait and mobility (R26.89)   Activity Tolerance Patient limited by lethargy   Patient Left in bed;with call bell/phone within reach;with restraints reapplied;with SCD's reapplied   Nurse Communication          Time: 1610-9604 OT Time Calculation (min): 28 min  Charges: OT General Charges $OT Visit: 1 Visit OT Treatments $Therapeutic Activity: 8-22 mins  Suzanne Brooks, M.S., OTR/L Pager: [phone]   Suzanne Brooks 08/14/2017, 4:29 PM

## 2017-08-14 NOTE — Progress Notes (Signed)
Pt seen and examined. No issues overnight. CTA done.  EXAM: Temp:  [97.9 F (36.6 C)-100.6 F (38.1 C)] 97.9 F (36.6 C) (09/28 0400) Pulse Rate:  [93-126] 100 (09/28 0700) Resp:  [16-25] 18 (09/28 0700) BP: (143-203)/(90-127) 201/127 (09/28 0700) SpO2:  [99 %-100 %] 100 % (09/28 0700) Weight:  [64.7 kg (142 lb 10.2 oz)] 64.7 kg (142 lb 10.2 oz) (09/28 0403) Intake/Output      09/27 0701 - 09/28 0700 09/28 0701 - 09/29 0700   I.V. (mL/kg) 3156.1 (48.8)    NG/GT 1200    Total Intake(mL/kg) 4356.1 (67.3)    Urine (mL/kg/hr) 2685 (1.7)    Drains 230    Stool 325    Total Output 3240     Net +1116.1             Eyes open spontaneously Will say her last name, very slow to respond Follows commands by showing thumbs BUE Not moving BLE EVD in place, draining clear CSF  LABS: Lab Results  Component Value Date   CREATININE 0.58 08/13/2017   BUN 10 08/13/2017   NA 142 08/13/2017   K 4.0 08/13/2017   CL 109 08/13/2017   CO2 25 08/13/2017   Lab Results  Component Value Date   WBC 21.9 (H) 08/14/2017   HGB 7.5 (L) 08/14/2017   HCT 24.3 (L) 08/14/2017   MCV 87.4 08/14/2017   PLT 781 (H) 08/14/2017    IMAGING: CTA reviewed, significant spasm bilateral ICA, bilateral ACA/MCA  IMPRESSION: - 41 y.o. female SAH d# 16 s/p coiling left ophthalmic, symptomatic diffuse anterior circulation spasm appears to be responding to hyperdynamic therapy. - Fevers are likely central, cultures thus far remain negative.  PLAN: - Will place A-line for cont BP monitoring/titrate Neo gtt - Insert foley - pt has skin breakdown from moisture - Cont SBP goal >180 mmHg, IVF at least 175 cc/hr

## 2017-08-14 NOTE — Progress Notes (Signed)
Transcranial Doppler  Date POD PCO2 HCT BP  MCA ACA PCA OPHT SIPH VERT Basilar  9/14 vs     Right  Left   49  44   -14  -44   21  37   -30  -35     -37    9/17 je     Right  Left   79  143   -45  -67   12  32   31  19   109  42   -60  -37   -54      9/19rs     Right  Left   164  181   -38  -71   48  66   29  26   59  46   -31  *   *      9/21rs      Right  Left   113  166   -35  -98   40  49  45   -42  -38   -90      9/24rs      Right  Left   191  188   -38  -39   37  25   39  28   33  36   -71  -49   -52      9/26 rs     Right  Left   203  164   -40  -44   72  52   46  36   37  177   -49  -80   -67      9/28,rs     Right  Left   176  95   -46  -54   76  53   28  22   59  184   -17  -20   -55       MCA = Middle Cerebral Artery      OPHT = Opthalmic Artery     BASILAR = Basilar Artery   ACA = Anterior Cerebral Artery     SIPH = Carotid Siphon PCA = Posterior Cerebral Artery   VERT = Verterbral Artery                   Normal MCA = 62+\-12 ACA = 50+\-12 PCA = 42+\-23   9/17 [place] ratio: right 4.4 left 4.6. je 9/19 - [place] ratio: right 3.4, left 6.0. (*) not insonated. RDS 9/21 - [place]  Ratio: right 3.8, left 7.2 - rds 9/24 - [place] Ratio: right, 4.7. Left 6.7 - rds 9/26 - [place] Ratio: right 6.5, left 6.5 - rds 9/28 - [place]ard Ratio: right 8.0, left 3.6 rds

## 2017-08-14 NOTE — Progress Notes (Signed)
PULMONARY / CRITICAL CARE MEDICINE   Name: Suzanne Brooks MRN: [id number] DOB: Dec 03, 1975    ADMISSION DATE:  07/30/2017 CONSULTATION DATE:  07/30/2017  REFERRING MD:  Conchita Paris  CHIEF COMPLAINT:  SAH  HISTORY OF PRESENT ILLNESS:   41 yo female presented with seizures and found to have SAH and Lt frontal lobe ICH.  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 PMHx of HTN.  SUBJECTIVE:  More awake today On neo drip  PICC placed  VITAL SIGNS: BP (!) 201/127   Pulse 100   Temp 97.9 F (36.6 C) (Axillary)   Resp 18   Ht  (1.6 m)   Wt 142 lb 10.2 oz (64.7 kg)   LMP  (LMP Unknown) Comment: neg preg test 07/30/17  SpO2 100%   BMI 25.27 kg/m   INTAKE / OUTPUT: I/O last 3 completed shifts: In: 6156.1 [I.V.:4356.1; NG/GT:1800] Out: 4200 [Urine:3210; Drains:365; Stool:625]  PHYSICAL EXAMINATION: Blood pressure (!) 201/127, pulse 100, temperature 97.9 F (36.6 C), temperature source Axillary, resp. rate 18, height  (1.6 m), weight 142 lb 10.2 oz (64.7 kg), SpO2 100 %. Gen:      No acute distress HEENT:  EOMI, sclera anicteric, EVD in place Neck:     No masses; no thyromegaly Lungs:    Clear to auscultation bilaterally; normal respiratory effort CV:         Regular rate and rhythm; no murmurs Abd:      + bowel sounds; soft, non-tender; no palpable masses, no distension Ext:    No edema; adequate peripheral perfusion Skin:      Warm and dry; no rash Neuro: Somnolent, arousable  LABS:  BMET  Recent Labs Lab 08/12/17 0158 08/13/17 0236 08/14/17 0819  NA 137 142 142  K 3.0* 4.0 3.4*  CL 104 109 106  CO2 BUN CREATININE 0.54 0.58 0.52  GLUCOSE 110* 133* 150*   Electrolytes  Recent Labs Lab 08/12/17 0158 08/13/17 0236 08/14/17 0819  CALCIUM 8.6* 8.9 9.1  MG 2.1 2.2 2.2  PHOS 3.1 2.8 3.4   CBC  Recent Labs Lab 08/12/17 0158 08/13/17 0236 08/14/17 0819  WBC 21.9* 21.4* 21.9*  HGB 7.8* 8.3* 7.5*  HCT 25.2* 27.3*  24.3*  PLT 702* 745* 781*   Coag's No results for input(s): APTT, INR in the last 168 hours. Sepsis Markers  Recent Labs Lab 08/11/17 0927 08/12/17 0158 08/13/17 0236  PROCALCITON <0.10 <0.10 <0.10    Glucose  Recent Labs Lab 08/13/17 1119 08/13/17 1636 08/13/17 1928 08/13/17 2312 08/14/17 0327 08/14/17 0808  GLUCAP 151* 144* 110* 150* 116* 121*   Imaging Ct Angio Head W Or Wo Contrast  Addendum Date: 08/13/2017   ADDENDUM REPORT: 08/13/2017 22:56 ADDENDUM: Critical Value/emergent results were called by telephone at the time of interpretation on 08/13/2017 at 10:56 pm to Dr. Marikay Alar, Neurosurgery, who verbally acknowledged these results. Electronically Signed   By: Awilda Metro M.D.   On: 08/13/2017 22:56   Result Date: 08/13/2017 CLINICAL DATA:  Subarachnoid hemorrhage. Assess for intracranial vasospasm. Status post coil embolization of LEFT paraophthalmic aneurysm. History of seizures and hypertension. EXAM: CT ANGIOGRAPHY HEAD TECHNIQUE: Multidetector CT imaging of the head was performed using the standard protocol during bolus administration of intravenous contrast. Multiplanar CT image reconstructions and MIPs were obtained to evaluate the vascular anatomy. CONTRAST:  50 cc Isovue 370 COMPARISON:  CT HEAD August 03, 2017 FINDINGS: CT HEAD BRAIN: Evolving LEFT inferior  frontal lobe hemorrhage with surrounding low-density vasogenic and cytotoxic edema. No new hemorrhage. No acute large vascular territory infarct. Resolution of subarachnoid hemorrhage. RIGHT frontal ventriculostomy catheter distal tip at third ventricle. Mild hydrocephalus, increased from prior imaging (anterior recess of third ventricle is 8 mm, previously 5 mm ; LEFT temporal horn is 9 mm, previously 6 mm). Residual intraventricular blood products dependent within the occipital horns. Small amount of intraventricular pneumocephalus. Gliosis along the catheter tract. Basal cisterns are patent. VASCULAR:  See below. SKULL/SOFT TISSUES: No skull fracture. RIGHT frontal burr hole. No significant soft tissue swelling. ORBITS/SINUSES: The included ocular globes and orbital contents are normal.The mastoid aircells and included paranasal sinuses are well-aerated. OTHER: None. CTA HEAD ANTERIOR CIRCULATION: Patent cervical internal carotid arteries, petrous, cavernous and supra clinoid internal carotid arteries. Severe stenosis LEFT supraclinoid internal carotid artery immediately distal to coil pack. 3 mm intact RIGHT paraophthalmic aneurysm. Severe tandem stenoses/luminal irregularity bilateral middle cerebral artery's, and proximal anterior cerebral artery's. POSTERIOR CIRCULATION: Patent vertebral arteries, vertebrobasilar junction and basilar artery, as well as main branch vessels. Moderate stenoses of the posterior cerebral artery's. No large vessel occlusion, significant stenosis, contrast extravasation or aneurysm. VENOUS SINUSES: Major dural venous sinuses are patent though not tailored for evaluation on this angiographic examination. ANATOMIC VARIANTS: None. DELAYED PHASE: Faint leptomeningeal enhancement consistent with recent subarachnoid hemorrhage. MIP images reviewed. IMPRESSION: CT HEAD: 1. 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 Residual intraventricular hemorrhage. 2. 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 3. Mild leptomeningeal enhancement presumably from recent subarachnoid hemorrhage. CTA HEAD: 1. Severe vasospasm of anterior greater than posterior circulation. No emergent large vessel occlusion. 2. Status post LEFT paraophthalmic aneurysm coil embolization. 3. 3 mm RIGHT paraophthalmic aneurysm. Electronically Signed: By: Awilda Metro M.D. On: 08/13/2017 22:49   Dg Chest Port 1 View  Result Date: 08/13/2017 CLINICAL DATA:  Persistent fever EXAM: PORTABLE CHEST 1 VIEW COMPARISON:  08/10/2017 FINDINGS: Cardiomegaly. NG tube is in the  stomach. Minimal bibasilar densities with decreased lung volumes, favor atelectasis. No effusions. No acute bony abnormality. IMPRESSION: Cardiomegaly. Decreasing lung volumes with minimal increase in bibasilar densities, likely atelectasis. Electronically Signed   By: Charlett Nose M.D.   On: 08/13/2017 10:11    STUDIES:  CT head 9/13 > Extensive subarachnoid hemorrhage throughout the basilar cisterns, sylvian fissure and interhemispheric fissure question ruptured aneurysm. Additional intra cerebral hematoma within the inferior LEFT frontal lobe 3.5 x 1.4 x 1.8 cm, un common to have intraparenchymal extension of subarachnoid hemorrhage, question concomitant or subsequent fall with traumatic hemorrhage? 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  Persistent fever post op.  ASSESSMENT / PLAN: Seizure. SAH, ICH s/p aneurysmal coiling Vasospasm EVD management per Neurosurgery Continue Keppra and nimodipine On neo and IVF for [facility] therapy  BP goals per NSGY  Fever with concern for HCAP, but also centrally mediated. Recieved full course abx for  HCAP, x10 days completed 9/25 CSF cultures and Pct are negative Monitor off antibiotics   Compromised airway for [facility], ICH. Remains high risk for intubation due to decreased mental status Observe in ICU  Hypokalemia.  Trend BMP  Replace as  indicated  Diarrhea, C diff negative Supportive care  Dysphagia. Speech therapy  DVT prophylaxis - SCDs SUP - zantac Nutrition - tube feeds Goals of care - full code  The patient is critically ill with multiple organ system failure and requires high complexity decision making for assessment and support, frequent evaluation and titration of therapies, advanced monitoring, review of radiographic studies and interpretation of complex data.   Critical Care Time devoted to patient care services, exclusive of separately billable procedures, described in this note is 35 minutes.   Chilton Greathouse MD [facility] Pager [phone] If no answer or after 3pm call: [phone] 08/14/2017, 10:07 AM

## 2017-08-15 ENCOUNTER — Inpatient Hospital Stay (HOSPITAL_COMMUNITY): Payer: Medicaid Other

## 2017-08-15 DIAGNOSIS — I610 Nontraumatic intracerebral hemorrhage in hemisphere, subcortical: Secondary | ICD-10-CM

## 2017-08-15 LAB — GLUCOSE, CAPILLARY
GLUCOSE-CAPILLARY: 125 mg/dL — AB (ref 65–99)
GLUCOSE-CAPILLARY: 142 mg/dL — AB (ref 65–99)

## 2017-08-15 MED ORDER — SODIUM CHLORIDE 0.9 % IV SOLN
0.0000 ug/min | INTRAVENOUS | Status: DC
  Administered 2017-08-15: 380 ug/min via INTRAVENOUS
  Administered 2017-08-15 (×3): 400 ug/min via INTRAVENOUS
  Administered 2017-08-15: 380 ug/min via INTRAVENOUS
  Administered 2017-08-15: 390 ug/min via INTRAVENOUS
  Administered 2017-08-16 (×5): 400 ug/min via INTRAVENOUS
  Administered 2017-08-17: 150 ug/min via INTRAVENOUS
  Administered 2017-08-17 (×3): 400 ug/min via INTRAVENOUS
  Administered 2017-08-18: 100 ug/min via INTRAVENOUS
  Administered 2017-08-19: 20 ug/min via INTRAVENOUS
  Filled 2017-08-15 (×17): qty 8
  Filled 2017-08-15: qty 6
  Filled 2017-08-15 (×2): qty 8

## 2017-08-15 MED ORDER — ALBUMIN HUMAN 5 % IV SOLN
25.0000 g | Freq: Four times a day (QID) | INTRAVENOUS | Status: DC
  Administered 2017-08-15 – 2017-08-20 (×21): 25 g via INTRAVENOUS
  Filled 2017-08-15 (×3): qty 500
  Filled 2017-08-15: qty 250
  Filled 2017-08-15: qty 500
  Filled 2017-08-15: qty 250
  Filled 2017-08-15 (×4): qty 500
  Filled 2017-08-15: qty 250
  Filled 2017-08-15: qty 500
  Filled 2017-08-15: qty 750
  Filled 2017-08-15 (×2): qty 500
  Filled 2017-08-15: qty 250
  Filled 2017-08-15 (×7): qty 500

## 2017-08-15 MED ORDER — POTASSIUM CHLORIDE 20 MEQ/15ML (10%) PO SOLN
40.0000 meq | Freq: Once | ORAL | Status: AC
  Administered 2017-08-15: 40 meq
  Filled 2017-08-15: qty 30

## 2017-08-15 NOTE — Progress Notes (Signed)
Orthopedic Tech Progress Note Patient Details:  Suzanne Brooks 1976-08-27 [id number]  Ortho Devices Type of Ortho Device: Postop shoe/boot Ortho Device/Splint Location: LLE prafo boot Ortho Device/Splint Interventions: Ordered, Application   Jennye Moccasin 08/15/2017, 5:11 PM

## 2017-08-15 NOTE — Progress Notes (Signed)
PULMONARY / CRITICAL CARE MEDICINE   Name: Suzanne Brooks MRN: [id number] DOB: 04-16-1976    ADMISSION DATE:  07/30/2017 CONSULTATION DATE:  07/30/2017  REFERRING MD:  Conchita Paris  CHIEF COMPLAINT:  SAH  HISTORY OF PRESENT ILLNESS:  41 y.o. female presented with seizures and found to have SAH and Lt frontal lobe ICH.  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 PMHx of HTN.  SUBJECTIVE:  No acute events overnight. Remains on vasopressor support for treatment of vasospasm. Blood pressures extremely labile with minimal adjustments in vasopressor support.  REVIEW OF SYSTEMS:  Unable to obtain with altered mental status.  VITAL SIGNS: BP (!) 166/103   Pulse 100   Temp 99.2 F (37.3 C) (Oral)   Resp 17   Ht  (1.6 m)   Wt 142 lb 6.7 oz (64.6 kg)   LMP  (LMP Unknown) Comment: neg preg test 07/30/17  SpO2 100%   BMI 25.23 kg/m   INTAKE / OUTPUT: I/O last 3 completed shifts: In: 7579.1 [I.V.:5779.1; NG/GT:1800] Out: 6524 [Urine:6075; Drains:299; Stool:150]  PHYSICAL EXAMINATION: General:  Eyes closed. No family at bedside. No distress.  Integument:  Warm. Dry. No rash on exposed skin.Marland Kitchen Extremities:  No cyanosis or clubbing.  HEENT:  EVD in place. NG tube in place. Moist mucous membranes. No scleral icterus. Cardiovascular:  Regular rate. Unable to appreciate JVD. No edema.  Pulmonary:  Clear to auscultation. Normal work of breathing on room air. Abdomen: Soft. Normal bowel sounds. Nondistended.  Neurological: Opens eyes to voice and seems to attend. Not following commands.  LABS:  BMET  Recent Labs Lab 08/12/17 0158 08/13/17 0236 08/14/17 0819  NA 137 142 142  K 3.0* 4.0 3.4*  CL 104 109 106  CO2 BUN CREATININE 0.54 0.58 0.52  GLUCOSE 110* 133* 150*   Electrolytes  Recent Labs Lab 08/12/17 0158 08/13/17 0236 08/14/17 0819  CALCIUM 8.6* 8.9 9.1  MG 2.1 2.2 2.2  PHOS 3.1 2.8 3.4   CBC  Recent Labs Lab 08/12/17 0158  08/13/17 0236 08/14/17 0819  WBC 21.9* 21.4* 21.9*  HGB 7.8* 8.3* 7.5*  HCT 25.2* 27.3* 24.3*  PLT 702* 745* 781*   Coag's No results for input(s): APTT, INR in the last 168 hours. Sepsis Markers  Recent Labs Lab 08/11/17 0927 08/12/17 0158 08/13/17 0236  PROCALCITON <0.10 <0.10 <0.10    Glucose  Recent Labs Lab 08/14/17 1228 08/14/17 1525 08/14/17 2010 08/14/17 2353 08/15/17 0352 08/15/17 0722  GLUCAP 109* 142* 120* 108* 125* 142*   Imaging No results found.  STUDIES:  UDS 9/13:  Negative  CT HEAD W/O 9/13: IMPRESSION: 1. Extensive subarachnoid hemorrhage throughout the basilar cisterns, sylvian fissure and interhemispheric fissure question ruptured aneurysm. 2. Additional intra cerebral hematoma within the inferior LEFT frontal lobe 3.5 x 1.4 x 1.8 cm, un common to have intraparenchymal extension of subarachnoid hemorrhage, question concomitant or subsequent fall with traumatic hemorrhage? CT HEAD W/O 9/17: IMPRESSION: 1. Satisfactory extraventricular drainage catheter placement. Slight decrease in ventricular size, particularly temporal horns. Resolving parenchymal and subarachnoid blood. 2. No visible cortical infarction, but cannot exclude early ischemic change in the LEFT posterior limb internal capsule, as well as scattered areas of the LEFT frontal subcortical white matter. CSF 9/20:  RBC 14025 / Glucose 74 / Total Protein 43 / WBC 45 (neutrophils 88%, lymphocytes 8%, & monocytes 4%) CT HEAD W/O 9/27: IMPRESSION: 1. 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  Residual intraventricular hemorrhage. 2. 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 3. Mild leptomeningeal enhancement presumably from recent subarachnoid hemorrhage. CTA HEAD 9/27: IMPRESSION: 1. Severe vasospasm of anterior greater than posterior circulation. No emergent large vessel occlusion. 2. Status post LEFT paraophthalmic aneurysm coil  embolization. 3. 3 mm RIGHT paraophthalmic aneurysm. 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 Started on albumin for vasospasm by neurosurgery.  LINES/TUBES: OETT 9/13 - 9/15 EVD 9/14 >>> RUE DL PICC 1/61 >>> L RAD ART LINE 9/28 >>> FOLEY 9/28 >>> L NGT >>> PIV  ASSESSMENT / PLAN:  41 y.o. female with extensive subarachnoid hemorrhage and left intracerebral hematoma/hemorrhage from ophthalmic artery status post coiling. Patient's ongoing fever, altered mentation, and labile blood pressure are all concerning for a centrally mediated process. I believe she is at high risk for reintubation given her mental status and risk of aspiration.  1. Left intracerebral hematoma/hemorrhage & extensive subarachnoid hemorrhage: EVD in place. Continuing nimodipine and Keppra. Management per neurology & neurosurgery. 2. Vasospasm: Continuing nimodipine. Goal systolic blood pressure per neurosurgery. Continuing Neo-Synephrine for goal systolic blood pressure. 3. Acute encephalopathy: Likely due to intracerebral hemorrhage/hematoma. Continue close monitoring given risk of intubation for airway protection. 4. Seizure: Continuing seizure precautions. Continuing Keppra twice a day. Further treatment per neurology and neurosurgery. 5. Fever of unknown origin: Previously concern for healthcare associated pneumonia but also possibly centrally mediated. Culture negative. Continuing to hold on antibiotic therapy. Plan to  reculture for fever. Checking portable chest x-ray today. 6. Dysphagia: Speech therapy following. Continuing nothing by mouth status. 7. Diarrhea: C. difficile negative. Likely secondary to feedings. 8. Hypokalemia: Likely secondary to diarrhea. Replacing via tube with KCl . Monitor electrolytes and renal function daily. 9. Hyperglycemia: No history of diabetes mellitus. Glucose within acceptable range. Discontinuing Accu-Cheks. Monitoring glucose on daily electrolyte panel.  Prophylaxis: SCDs & Zantac twice a day. Diet: Nothing by mouth. Continuing tube feedings. Code Status: Full code per previous physician discussions. Disposition: Remains critically ill in the ICU. Family Update:  No family at bedside at the time of my exam.  I have personally spent a total of 31 minutes of critical care time today caring for the patient & reviewing the patient's electronic medical record.  Remainder of care as per primary service and other consultants.  Donna Christen Jamison Neighbor, M.D. [facility] Pager:  [phone] After 3pm or if no response, call [phone] 08/15/2017, 10:48 AM

## 2017-08-15 NOTE — Progress Notes (Signed)
MD notified of pt's BP fluctuation with minimal titration.  No new orders at this time.  Will continue to monitor.

## 2017-08-15 NOTE — Progress Notes (Signed)
Pt seen and examined. Worsening neurological function  EXAM: Temp:  [98.3 F (36.8 C)-102 F (38.9 C)] 99.2 F (37.3 C) (09/29 0800) Pulse Rate:  [75-119] 92 (09/29 0745) Resp:  [14-27] 14 (09/29 0745) BP: (135-221)/(86-140) 196/117 (09/29 0745) SpO2:  [99 %-100 %] 100 % (09/29 0745) Arterial Line BP: (204-241)/(107-115) 241/115 (09/28 1900) Weight:  [64.6 kg (142 lb 6.7 oz)] 64.6 kg (142 lb 6.7 oz) (09/29 0500) Intake/Output      09/28 0701 - 09/29 0700 09/29 0701 - 09/30 0700   I.V. (mL/kg) 4361 (67.5)    NG/GT 1200    Total Intake(mL/kg) 5561 (86.1)    Urine (mL/kg/hr) 5050 (3.3)    Drains 195 14   Total Output 5245 14   Net +316 -14        Urine Occurrence 3 x    Stool Occurrence 3 x     Arousable Non verbal Not following commands Weak BUE; plegic BLE EVD in place; patent  LABS: Lab Results  Component Value Date   CREATININE 0.52 08/14/2017   BUN 12 08/14/2017   NA 142 08/14/2017   K 3.4 (L) 08/14/2017   CL 106 08/14/2017   CO2 25 08/14/2017   Lab Results  Component Value Date   WBC 21.9 (H) 08/14/2017   HGB 7.5 (L) 08/14/2017   HCT 24.3 (L) 08/14/2017   MCV 87.4 08/14/2017   PLT 781 (H) 08/14/2017    IMPRESSION: - 41 y.o. female with known severe vasospasm after subarachnoid hemorrhage; on hyperdynamic therapy.  Neurological worsening.  PLAN: - Continue current care - Add albumin 25 g q6 hours

## 2017-08-16 ENCOUNTER — Inpatient Hospital Stay (HOSPITAL_COMMUNITY): Payer: Medicaid Other

## 2017-08-16 LAB — BASIC METABOLIC PANEL
Anion gap: 8 (ref 5–15)
BUN: 18 mg/dL (ref 6–20)
CHLORIDE: 107 mmol/L (ref 101–111)
CO2: 22 mmol/L (ref 22–32)
CREATININE: 0.46 mg/dL (ref 0.44–1.00)
Calcium: 9.6 mg/dL (ref 8.9–10.3)
GFR calc Af Amer: >60 mL/min (ref >60)
GFR calc non Af Amer: >60 mL/min (ref >60)
GLUCOSE: 120 mg/dL — AB (ref 65–99)
POTASSIUM: 3.6 mmol/L (ref 3.5–5.1)
SODIUM: 137 mmol/L (ref 135–145)

## 2017-08-16 LAB — CBC WITH DIFFERENTIAL/PLATELET
BASOS PCT: 0 %
Basophils Absolute: 0 10*3/uL (ref 0.0–0.1)
EOS ABS: 0.7 10*3/uL (ref 0.0–0.7)
Eosinophils Relative: 4 %
HCT: 23.4 % — ABNORMAL LOW (ref 36.0–46.0)
HEMOGLOBIN: 7.1 g/dL — AB (ref 12.0–15.0)
Lymphocytes Relative: 10 %
Lymphs Abs: 1.9 10*3/uL (ref 0.7–4.0)
MCH: 26.3 pg (ref 26.0–34.0)
MCHC: 30.3 g/dL (ref 30.0–36.0)
MCV: 86.7 fL (ref 78.0–100.0)
MONOS PCT: 6 %
Monocytes Absolute: 1.2 10*3/uL — ABNORMAL HIGH (ref 0.1–1.0)
NEUTROS PCT: 80 %
Neutro Abs: 15.9 10*3/uL — ABNORMAL HIGH (ref 1.7–7.7)
Platelets: 788 10*3/uL — ABNORMAL HIGH (ref 150–400)
RBC: 2.7 MIL/uL — ABNORMAL LOW (ref 3.87–5.11)
RDW: 19.6 % — AB (ref 11.5–15.5)
WBC: 19.8 10*3/uL — ABNORMAL HIGH (ref 4.0–10.5)

## 2017-08-16 LAB — RENAL FUNCTION PANEL
ALBUMIN: 4.4 g/dL (ref 3.5–5.0)
Anion gap: 11 (ref 5–15)
BUN: 12 mg/dL (ref 6–20)
CO2: 22 mmol/L (ref 22–32)
CREATININE: 0.45 mg/dL (ref 0.44–1.00)
Calcium: 9.5 mg/dL (ref 8.9–10.3)
Chloride: 105 mmol/L (ref 101–111)
GFR calc Af Amer: >60 mL/min (ref >60)
GFR calc non Af Amer: >60 mL/min (ref >60)
GLUCOSE: 151 mg/dL — AB (ref 65–99)
Phosphorus: 3.4 mg/dL (ref 2.5–4.6)
Potassium: 3 mmol/L — ABNORMAL LOW (ref 3.5–5.1)
SODIUM: 138 mmol/L (ref 135–145)

## 2017-08-16 LAB — CULTURE, BLOOD (ROUTINE X 2)
Culture: NO GROWTH
Culture: NO GROWTH
Special Requests: ADEQUATE
Special Requests: ADEQUATE

## 2017-08-16 LAB — MAGNESIUM: MAGNESIUM: 2.1 mg/dL (ref 1.7–2.4)

## 2017-08-16 MED ORDER — POTASSIUM CHLORIDE 10 MEQ/50ML IV SOLN
10.0000 meq | INTRAVENOUS | Status: AC
Start: 2017-08-16 — End: 2017-08-16
  Administered 2017-08-16 (×6): 10 meq via INTRAVENOUS
  Filled 2017-08-16 (×5): qty 50

## 2017-08-16 MED ORDER — POTASSIUM CHLORIDE 10 MEQ/50ML IV SOLN
INTRAVENOUS | Status: AC
  Filled 2017-08-16: qty 50

## 2017-08-16 NOTE — Progress Notes (Signed)
PULMONARY / CRITICAL CARE MEDICINE   Name: Suzanne Brooks MRN: [id number] DOB: [birth year]    ADMISSION DATE:  07/30/2017 CONSULTATION DATE:  07/30/2017  REFERRING MD:  Conchita Paris  CHIEF COMPLAINT:  SAH  HISTORY OF PRESENT ILLNESS:  41 y.o. female presented with seizures and found to have SAH and Lt frontal lobe ICH.  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 PMHx of HTN.  SUBJECTIVE:  No acute events overnight. More awake and interactive today. Remains on vasopressor support.  REVIEW OF SYSTEMS:  Unable to obtain with altered mental status.  VITAL SIGNS: BP (!) 202/120 (BP Location: Left Arm)   Pulse 81   Temp 98.7 F (37.1 C) (Axillary)   Resp 20   Ht  (1.6 m)   Wt 65.6 kg (144 lb 10 oz)   LMP  (LMP Unknown) Comment: neg preg test 07/30/17  SpO2 100%   BMI 25.62 kg/m   INTAKE / OUTPUT: I/O last 3 completed shifts: In: 9258.2 [I.V.:6458.2; NG/GT:1800; IV Piggyback:1000] Out: 7756 [Urine:7300; Drains:306; Stool:150]  PHYSICAL EXAMINATION: General:  Awake.No acute distress. No family at bedside.  Integument:  Warm. Dry. No rash on exposed skin.  Extremities:  No cyanosis or clubbing.  HEENT:  No scleral icterus. Right sided EVD in place. Moist mucous membranes. Cardiovascular:  Regular rhythm. No edema. No appreciable JVD.  Pulmonary:  Normal work of breathing on room air. Slightly diminished in the bases. Abdomen: Soft. Normal bowel sounds. Nondistended.  Neurological: Attempts to answer questions with nonsensical speech. Raises right hand on command but not consistently following commands otherwise. Attends to voice.  LABS:  BMET  Recent Labs Lab 08/12/17 0158 08/13/17 0236 08/14/17 0819  NA 137 142 142  K 3.0* 4.0 3.4*  CL 104 109 106  CO2 BUN CREATININE 0.54 0.58 0.52  GLUCOSE 110* 133* 150*   Electrolytes  Recent Labs Lab 08/12/17 0158 08/13/17 0236 08/14/17 0819  CALCIUM 8.6* 8.9 9.1  MG 2.1 2.2 2.2  PHOS  3.1 2.8 3.4   CBC  Recent Labs Lab 08/13/17 0236 08/14/17 0819 08/16/17 0427  WBC 21.4* 21.9* 19.8*  HGB 8.3* 7.5* 7.1*  HCT 27.3* 24.3* 23.4*  PLT 745* 781* 788*   Coag's No results for input(s): APTT, INR in the last 168 hours. Sepsis Markers  Recent Labs Lab 08/11/17 0927 08/12/17 0158 08/13/17 0236  PROCALCITON <0.10 <0.10 <0.10    Glucose  Recent Labs Lab 08/14/17 1228 08/14/17 1525 08/14/17 2010 08/14/17 2353 08/15/17 0352 08/15/17 0722  GLUCAP 109* 142* 120* 108* 125* 142*   Imaging Dg Chest Port 1 View  Result Date: 08/15/2017 CLINICAL DATA:  Fever EXAM: PORTABLE CHEST 1 VIEW COMPARISON:  08/13/2017 FINDINGS: There is a right arm PICC line with tip in the low right atrium. Normal heart size. No pleural effusion or edema. No airspace opacities. IMPRESSION: 1. No pneumonia. 2. Right arm PICC line tip is in the low right atrium. Electronically Signed   By: Signa Kell M.D.   On: 08/15/2017 16:14    STUDIES:  UDS 9/13:  Negative  CT HEAD W/O 9/13: IMPRESSION: 1. Extensive subarachnoid hemorrhage throughout the basilar cisterns, sylvian fissure and interhemispheric fissure question ruptured aneurysm. 2. Additional intra cerebral hematoma within the inferior LEFT frontal lobe 3.5 x 1.4 x 1.8 cm, un common to have intraparenchymal extension of subarachnoid hemorrhage, question concomitant or subsequent fall with traumatic hemorrhage? CT HEAD W/O 9/17: IMPRESSION: 1. Satisfactory extraventricular drainage  catheter placement. Slight decrease in ventricular size, particularly temporal horns. Resolving parenchymal and subarachnoid blood. 2. No visible cortical infarction, but cannot exclude early ischemic change in the LEFT posterior limb internal capsule, as well as scattered areas of the LEFT frontal subcortical white matter. CSF 9/20:  RBC 14025 / Glucose 74 / Total Protein 43 / WBC 45 (neutrophils 88%, lymphocytes 8%, & monocytes 4%) CT HEAD W/O  9/27: IMPRESSION: 1. 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 Residual intraventricular hemorrhage. 2. 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 3. Mild leptomeningeal enhancement presumably from recent subarachnoid hemorrhage. CTA HEAD 9/27: IMPRESSION: 1. Severe vasospasm of anterior greater than posterior circulation. No emergent large vessel occlusion. 2. Status post LEFT paraophthalmic aneurysm coil embolization. 3. 3 mm RIGHT paraophthalmic aneurysm. PORT CXR 9/29:  Personally reviewed by me. Right upper extremity PICC line and change to position. No focal opacity or effusion appreciated. Normal mediastinal contour and heart border.  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 Started on albumin for vasospasm by neurosurgery. 09/30 - On Neo-Synephrine at 400. More awake & intermittently following commands. Nonsensical speech.  LINES/TUBES: OETT 9/13 - 9/15 EVD 9/14 >>> RUE DL PICC 1/61 >>> L RAD ART LINE 9/28 >>> FOLEY 9/28 >>> L NGT >>> PIV  ASSESSMENT / PLAN:  41 y.o. female with extensive subarachnoid hemorrhage and left intracerebral hematoma/hemorrhage. Status post ophthalmic artery aneurysm coiling. Has ongoing altered mentation and fever.   1. Left intracerebral hemorrhage/hematoma & extensive subarachnoid hemorrhage: EVD in place. Continuing on nimodipine, Keppra, and albumin. Management per neurology and neurosurgery. 2. Vasospasm: Continuing on  nimodipine & albumin. Goal systolic blood pressure per neurosurgery. Continuing Neo-Synephrine for goal systolic blood pressure. 3. Acute encephalopathy: Multifactorial. Continuing to limit sedatives. 4. Seizure: Continuing seizure precautions. Keppra twice a day. Management per neurology and neurosurgery. 5. Fever of unknown origin: Likely centrally mediated. Awaiting finalization of culture. 6. Dysphagia: Speech following. Continuing nothing by mouth status pending improvement in neurologic status. 7. Diarrhea: Likely secondary to feedings. Previous C. difficile negative. 8. Hypokalemia: Likely secondary to diarrhea. Replacing with KCl total 60 mEq IV. Repeat basic metabolic panel at 5 PM. 9. Anemia: No signs of active bleeding. Continuing to trend cell counts daily.  10. Hyperglycemia: Monitor glucose on daily labs. No history of diabetes mellitus.   Prophylaxis: SCDs & Zantac twice a day. Diet: Nothing by mouth. Tube feedings per dietary recommendations. Code Status: Full code per previous physician discussions. Disposition: Remains critically ill in the ICU. Family Update:  No family at bedside at the time of my rounds.  I have personally spent a total of 31 minutes of critical care time today caring for the patient & reviewing the patient's electronic medical record.  Remainder of care as per primary service and other consultants.  Donna Christen Jamison Neighbor, M.D. [facility] Pager:  [phone] After 3pm or if no response, call [phone] 08/16/2017, 5:27 AM

## 2017-08-16 NOTE — Progress Notes (Signed)
Pt seen and examined. No issues overnight. Reportedly woke up around midnight and was very verbal.  EXAM: Temp:  [98.1 F (36.7 C)-102 F (38.9 C)] 98.6 F (37 C) (09/30 0800) Pulse Rate:  [68-113] 81 (09/30 0800) Resp:  [15-25] 17 (09/30 0800) BP: (136-219)/(79-131) 192/118 (09/30 0800) SpO2:  [99 %-100 %] 99 % (09/30 0800) Arterial Line BP: (110-268)/(81-154) 140/101 (09/29 1900) Weight:  [65.6 kg (144 lb 10 oz)] 65.6 kg (144 lb 10 oz) (09/30 0406) Intake/Output      09/29 0701 - 09/30 0700 09/30 0701 - 10/01 0700   I.V. (mL/kg) 4243.4 (64.7)    NG/GT 1200    IV Piggyback 2000    Total Intake(mL/kg) 7443.4 (113.5)    Urine (mL/kg/hr) 5135 (3.3)    Drains 238    Stool 300    Total Output 5673     Net +1770.4          Stool Occurrence 1 x      Sleeping, arouses easily Oriented to name BUE paresis BLE plegia  LABS: Lab Results  Component Value Date   CREATININE 0.45 08/16/2017   BUN 12 08/16/2017   NA 138 08/16/2017   K 3.0 (L) 08/16/2017   CL 105 08/16/2017   CO2 22 08/16/2017   Lab Results  Component Value Date   WBC 19.8 (H) 08/16/2017   HGB 7.1 (L) 08/16/2017   HCT 23.4 (L) 08/16/2017   MCV 86.7 08/16/2017   PLT 788 (H) 08/16/2017    IMPRESSION: - 41 y.o. female with symptomatic vasospasm after subarachnoid hemorrhage.  Slightly improved today.  PLAN: - Continue current care - Aggressive hyperdynamic therapy

## 2017-08-17 ENCOUNTER — Inpatient Hospital Stay (HOSPITAL_COMMUNITY): Payer: Medicaid Other

## 2017-08-17 DIAGNOSIS — I609 Nontraumatic subarachnoid hemorrhage, unspecified: Secondary | ICD-10-CM

## 2017-08-17 LAB — CBC WITH DIFFERENTIAL/PLATELET
BASOS ABS: 0 10*3/uL (ref 0.0–0.1)
Basophils Relative: 0 %
EOS ABS: 0.8 10*3/uL — AB (ref 0.0–0.7)
Eosinophils Relative: 3 %
HCT: 26 % — ABNORMAL LOW (ref 36.0–46.0)
Hemoglobin: 8.2 g/dL — ABNORMAL LOW (ref 12.0–15.0)
LYMPHS ABS: 2.1 10*3/uL (ref 0.7–4.0)
Lymphocytes Relative: 8 %
MCH: 27.2 pg (ref 26.0–34.0)
MCHC: 31.5 g/dL (ref 30.0–36.0)
MCV: 86.4 fL (ref 78.0–100.0)
MONO ABS: 1 10*3/uL (ref 0.1–1.0)
MONOS PCT: 4 %
NEUTROS ABS: 21.8 10*3/uL — AB (ref 1.7–7.7)
Neutrophils Relative %: 85 %
PLATELETS: 730 10*3/uL — AB (ref 150–400)
RBC: 3.01 MIL/uL — AB (ref 3.87–5.11)
RDW: 20.1 % — AB (ref 11.5–15.5)
WBC: 25.7 10*3/uL — AB (ref 4.0–10.5)

## 2017-08-17 LAB — PROCALCITONIN: Procalcitonin: 0.19 ng/mL

## 2017-08-17 LAB — RENAL FUNCTION PANEL
ALBUMIN: 4.2 g/dL (ref 3.5–5.0)
Anion gap: 7 (ref 5–15)
BUN: 26 mg/dL — ABNORMAL HIGH (ref 6–20)
CO2: 21 mmol/L — ABNORMAL LOW (ref 22–32)
Calcium: 9.6 mg/dL (ref 8.9–10.3)
Chloride: 113 mmol/L — ABNORMAL HIGH (ref 101–111)
Creatinine, Ser: 0.61 mg/dL (ref 0.44–1.00)
GFR calc Af Amer: >60 mL/min (ref >60)
GLUCOSE: 106 mg/dL — AB (ref 65–99)
PHOSPHORUS: 3.9 mg/dL (ref 2.5–4.6)
POTASSIUM: 3.3 mmol/L — AB (ref 3.5–5.1)
SODIUM: 141 mmol/L (ref 135–145)

## 2017-08-17 LAB — SODIUM
Sodium: 140 mmol/L (ref 135–145)
Sodium: 148 mmol/L — ABNORMAL HIGH (ref 135–145)

## 2017-08-17 LAB — MAGNESIUM: MAGNESIUM: 2.3 mg/dL (ref 1.7–2.4)

## 2017-08-17 MED ORDER — NIMODIPINE 30 MG PO CAPS: 30.0000 mg | ORAL_CAPSULE | ORAL | Status: DC

## 2017-08-17 MED ORDER — SODIUM CHLORIDE 3 % IV SOLN
INTRAVENOUS | Status: DC
  Administered 2017-08-17 – 2017-08-18 (×4): 100 mL/h via INTRAVENOUS
  Filled 2017-08-17 (×8): qty 500

## 2017-08-17 MED ORDER — NIMODIPINE 60 MG/20ML PO SOLN
30.0000 mg | ORAL | Status: DC
  Administered 2017-08-17 – 2017-08-22 (×60): 30 mg
  Filled 2017-08-17 (×37): qty 20

## 2017-08-17 MED ORDER — NOREPINEPHRINE BITARTRATE 1 MG/ML IV SOLN
0.0000 ug/min | INTRAVENOUS | Status: DC
  Administered 2017-08-17: 30 ug/min via INTRAVENOUS
  Administered 2017-08-17: 40 ug/min via INTRAVENOUS
  Administered 2017-08-18: 50 ug/min via INTRAVENOUS
  Administered 2017-08-18 – 2017-08-19 (×7): 70 ug/min via INTRAVENOUS
  Administered 2017-08-20: 30 ug/min via INTRAVENOUS
  Filled 2017-08-17 (×14): qty 16

## 2017-08-17 MED ORDER — POTASSIUM CHLORIDE CRYS ER 20 MEQ PO TBCR
40.0000 meq | EXTENDED_RELEASE_TABLET | Freq: Once | ORAL | Status: AC
  Administered 2017-08-17: 40 meq via ORAL
  Filled 2017-08-17: qty 2

## 2017-08-17 MED ORDER — DEXTROSE 5 % IV SOLN
0.0000 ug/min | INTRAVENOUS | Status: DC
  Administered 2017-08-17: 2 ug/min via INTRAVENOUS
  Filled 2017-08-17: qty 4

## 2017-08-17 NOTE — Progress Notes (Signed)
Pt seen and examined. No issues overnight. SBP difficult to maintain goal on Neo.  EXAM: Temp:  [97.4 F (36.3 C)-102.2 F (39 C)] 100.7 F (38.2 C) (10/01 1200) Pulse Rate:  [63-116] 96 (10/01 1230) Resp:  [16-37] 23 (10/01 1230) BP: (121-210)/(63-124) 194/109 (10/01 1230) SpO2:  [84 %-100 %] 100 % (10/01 1230) Arterial Line BP: (112-238)/(70-122) 186/91 (10/01 0100) Weight:  [64.7 kg (142 lb 10.2 oz)] 64.7 kg (142 lb 10.2 oz) (10/01 0500) Intake/Output      09/30 0701 - 10/01 0700 10/01 0701 - 10/02 0700   I.V. (mL/kg) 4246 (65.6) 706.3 (10.9)   NG/GT 1025 180   IV Piggyback 2300 500   Total Intake(mL/kg) 7571 (117) 1386.3 (21.4)   Urine (mL/kg/hr) 5100 (3.3) 1250 (3.2)   Drains 251 26   Stool 300    Total Output 5651 1276   Net +1920 +110.3         Awake, alert, oriented to name, not place or year CN grossly intact Good strength, >antigravity BUE and LLE No movement RLE EVD in place  LABS: Lab Results  Component Value Date   CREATININE 0.61 08/17/2017   BUN 26 (H) 08/17/2017   NA 141 08/17/2017   K 3.3 (L) 08/17/2017   CL 113 (H) 08/17/2017   CO2 21 (L) 08/17/2017   Lab Results  Component Value Date   WBC 25.7 (H) 08/17/2017   HGB 8.2 (L) 08/17/2017   HCT 26.0 (L) 08/17/2017   MCV 86.4 08/17/2017   PLT 730 (H) 08/17/2017    IMPRESSION: - 42 y.o. female SAH d# 19 s/p left ophthalmic coiling and symptomatic spasm. Appears to be responding to hyperdynamic therapy. - Cont to be febrile  PLAN: - Can speak with PCCM to add second vasopressor for continued SBP goal > - Will change IVF to hypertonic saline - Cont albumin q6 - TCD today

## 2017-08-17 NOTE — Progress Notes (Signed)
Transcranial Doppler  Date POD PCO2 HCT BP  MCA ACA PCA OPHT SIPH VERT Basilar  9/14 vs     Right  Left   49  44   -14  -44   21  37   -30  -35     -37    9/17 je     Right  Left   79  143   -45  -67   12  32   31  19   109  42   -60  -37   -54      9/19rs     Right  Left   164  181   -38  -71   48  66   29  26   59  46   -31  *   *      9/21rs      Right  Left   113  166   -35  -98   40  49  45   -42  -38   -90      9/24rs      Right  Left   191  188   -38  -39   37  25   39  28   33  36   -71  -49   -52      9/26 rs     Right  Left   203  164   -40  -44   72  52   46  36   37  177   -49  -80   -67      9/28,rs     Right  Left   176  95   -46  -54   76  53   28  22   59  184   -17  -20   -55      10/1     Right  67  -32  25  26  137  -34  -45  rs     Left 70 -39 38 25 119 -33    MCA = Middle Cerebral Artery      OPHT = Opthalmic Artery     BASILAR = Basilar Artery   ACA = Anterior Cerebral Artery     SIPH = Carotid Siphon PCA = Posterior Cerebral Artery   VERT = Verterbral Artery                   Normal MCA = 62+\-12 ACA = 50+\-12 PCA = 42+\-23   9/17 Lindegaard ratio: right 4.4 left 4.6. je 9/19 - Lindegaard ratio: right 3.4, left 6.0. (*) not insonated. RDS 9/21 - Lindegaard  Ratio: right 3.8, left 7.2 - rds 9/24 - Lindegaard Ratio: right, 4.7. Left 6.7 - rds 9/26 - Lindegaard Ratio: right 6.5, left 6.5 - rds 9/28 - Lindegaard Ratio: right 8.0, left 3.6 rds 10/1-  Lindegaard Ratio: right 1.4. Left 2.5, rds.

## 2017-08-17 NOTE — Progress Notes (Signed)
PULMONARY / CRITICAL CARE MEDICINE   Name: Suzanne Brooks MRN: [id number] DOB: 09-12-1976    ADMISSION DATE:  07/30/2017 CONSULTATION DATE:  07/30/2017  REFERRING MD:  Conchita Paris  CHIEF COMPLAINT:  SAH  HISTORY OF PRESENT ILLNESS:  41 y.o. female presented with seizures and found to have SAH and Lt frontal lobe ICH.  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 PMHx of HTN.  SUBJECTIVE:  RN reports pt remains on [facility] therapy.  NSGY SBP goal 180-200.  Waxing / waning mental status. Pt pulled aline, NGT out overnight.  Had dressing off ventric drain.  Tmax 102.2 in last 24 hours.   VITAL SIGNS: BP 122/78   Pulse 97   Temp (!) 97.4 F (36.3 C) (Axillary)   Resp (!) 25   Ht  (1.6 m)   Wt 142 lb 10.2 oz (64.7 kg)   LMP  (LMP Unknown) Comment: neg preg test 07/30/17  SpO2 99%   BMI 25.27 kg/m   INTAKE / OUTPUT: I/O last 3 completed shifts: In: 7692.2 [I.V.:4292.2; NG/GT:1100; IV Piggyback:2300] Out: 4098 [Urine:7985; Drains:378; Stool:450]  PHYSICAL EXAMINATION: General:  Adult female in NAD, lying in bed HEENT: MM pink/moist, no JVD, EVD drain in place Neuro: Awake, alert, answers questions, soft voice, occasionally oriented, moves all ext's to command except RLE CV: s1s2 rrr, SEM PULM: even/non-labored, lungs bilaterally clear  JX:BJYN, non-tender, bsx4 active  Extremities: warm/dry, no edema  Skin: no rashes or lesions  LABS:  BMET  Recent Labs Lab 08/16/17 0427 08/16/17 1915 08/17/17 0420  NA 138 137 141  K 3.0* 3.6 3.3*  CL 105 107 113*  CO2 22 22 21*  BUN 12 18 26*  CREATININE 0.45 0.46 0.61  GLUCOSE 151* 120* 106*   Electrolytes  Recent Labs Lab 08/14/17 0819 08/16/17 0427 08/16/17 1915 08/17/17 0420  CALCIUM 9.1 9.5 9.6 9.6  MG 2.2 2.1  --  2.3  PHOS 3.4 3.4  --  3.9   CBC  Recent Labs Lab 08/14/17 0819 08/16/17 0427 08/17/17 0420  WBC 21.9* 19.8* 25.7*  HGB 7.5* 7.1* 8.2*  HCT 24.3* 23.4* 26.0*  PLT 781* 788* 730*    Coag's No results for input(s): APTT, INR in the last 168 hours. Sepsis Markers  Recent Labs Lab 08/11/17 0927 08/12/17 0158 08/13/17 0236  PROCALCITON <0.10 <0.10 <0.10    Glucose  Recent Labs Lab 08/14/17 1228 08/14/17 1525 08/14/17 2010 08/14/17 2353 08/15/17 0352 08/15/17 0722  GLUCAP 109* 142* 120* 108* 125* 142*   Imaging Dg Abd Portable 1v  Result Date: 08/17/2017 CLINICAL DATA:  Feeding tube placement EXAM: PORTABLE ABDOMEN - 1 VIEW COMPARISON:  08/16/2017 FINDINGS: Enteric tube tip is in the left upper quadrant consistent with location in the upper body of the stomach. The tube is advanced since the previous study. The side hole is now within the expected location of the stomach is well. Scattered gas in the small and large bowel without distention suggesting ileus. IMPRESSION: Enteric tube tip in the left upper quadrant consistent with location in the mid body of the stomach. Electronically Signed   By: Burman Nieves M.D.   On: 08/17/2017 06:10   Dg Abd Portable 1v  Result Date: 08/16/2017 CLINICAL DATA:  NG tube placement. EXAM: PORTABLE ABDOMEN - 1 VIEW COMPARISON:  08/06/2017 FINDINGS: An NG tube is identified with tip overlying the proximal stomach and side hole at the esophagogastric junction -recommend advancement. Nondistended gas-filled loops of small bowel and colon noted. IMPRESSION:  NG tube with tip overlying the proximal stomach and side hole at the esophagogastric junction - recommend advancement. Electronically Signed   By: Harmon Pier M.D.   On: 08/16/2017 11:08    STUDIES:  UDS 9/13:  Negative  CT HEAD W/O 9/13: IMPRESSION: 1. Extensive subarachnoid hemorrhage throughout the basilar cisterns, sylvian fissure and interhemispheric fissure question ruptured aneurysm. 2. Additional intra cerebral hematoma within the inferior LEFT frontal lobe 3.5 x 1.4 x 1.8 cm, un common to have intraparenchymal extension of subarachnoid hemorrhage, question  concomitant or subsequent fall with traumatic hemorrhage? CT HEAD W/O 9/17: IMPRESSION: 1. Satisfactory extraventricular drainage catheter placement. Slight decrease in ventricular size, particularly temporal horns. Resolving parenchymal and subarachnoid blood. 2. No visible cortical infarction, but cannot exclude early ischemic change in the LEFT posterior limb internal capsule, as well as scattered areas of the LEFT frontal subcortical white matter. CSF 9/20:  RBC 14025 / Glucose 74 / Total Protein 43 / WBC 45 (neutrophils 88%, lymphocytes 8%, & monocytes 4%) CT HEAD W/O 9/27: IMPRESSION: 1. 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 Residual intraventricular hemorrhage. 2. 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 3. Mild leptomeningeal enhancement presumably from recent subarachnoid hemorrhage. CTA HEAD 9/27: IMPRESSION: 1. Severe vasospasm of anterior greater than posterior circulation. No emergent large vessel occlusion. 2. Status post LEFT paraophthalmic aneurysm coil embolization. 3. 3 mm RIGHT paraophthalmic aneurysm. PORT CXR 9/29:  Personally reviewed by me. Right upper extremity PICC line and change to position. No focal opacity or effusion appreciated. Normal mediastinal contour and heart border.  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 Started on albumin for vasospasm by neurosurgery. 09/30 - On Neo-Synephrine at 400. More awake & intermittently  following commands. Nonsensical speech.  LINES/TUBES: OETT 9/13 - 9/15 EVD 9/14 >>> RUE DL PICC 7/82 >>> L RAD ART LINE 9/28 >>> 10/1 (pt pulled) FOLEY 9/28 >>> L NGT >>> PIV  ASSESSMENT / PLAN:  41 y.o. female with extensive subarachnoid hemorrhage and left intracerebral hematoma/hemorrhage. Status post ophthalmic artery aneurysm coiling. Has ongoing altered mentation and fever.    Left intracerebral hemorrhage/hematoma & extensive subarachnoid hemorrhage  P: EVD drain per NSGY  Continue nimodipine, Keppra, albumin  SBP goal 180-200 per NSGY Frequent neuro exams Change neo to levophed for BP goals  If unable to meet goal with levophed, consider florinef    Vasospasm  P: Continue nimodipine + albumin as above SBP per NSGY    Acute encephalopathy - multifactorial.  P: Supportive care  Minimize sedating medications   Seizure  P: Seizure precautions  Keppra BID    Fever of unknown origin - likely centrally mediated, r/o infection, WBC rising P: Follow cultures as above  Trend PCT  Consider CSF culture   Dysphagia  P: SLP following NPO  TF per Nutrition    Diarrhea - C-diff negative, suspect secondary to TF P: Monitor volume, BMP   Hypokalemia P: Monitor, replace as indicated    Anemia - without evidence of bleeding  P:  Trend CBC    Hyperglycemia  P: Monitor glucose on BMP   Prophylaxis: SCDs & Zantac twice a day. Diet: NPO, TF per Nutrition. Code Status: Full code  Disposition: Remains critically ill in the ICU. Family Update:    CC Time: 30 minutes  Canary Brim, NP-C [facility] Pgr: [phone] or if no answer [phone] 08/17/2017, 9:42 AM   Patient seen and examined with Canary Brim. Labs and radiologic findings reviewed. Agrre with plan of care as outlined

## 2017-08-17 NOTE — Progress Notes (Signed)
  Speech Language Pathology Treatment: Dysphagia  Patient Details Name: Suzanne Brooks MRN: [id number] DOB: 1976/02/27 Today's Date: 08/17/2017 Time: 5366-4403 SLP Time Calculation (min) (ACUTE ONLY): 8 min  Assessment / Plan / Recommendation Clinical Impression  Patient with mild improvement in level of alertness today, aroused when name called however remains moderately lethargic resulting delayed oral transit with conservative po trials (ice chips) despite moderate-max verbal and tactile cueing. Vocal quality low in intensity at baseline, becoming mildly wet with po intake suggestive of decreased airway protection. Not yet ready for repeat instrumental testing. Will continue to f/u.    HPI HPI: Suzanne Brooks is a 41 y/o female who presents with seizure activity. Suzanne Brooks was found to have a subarachnoid hemorrhage, and is now s/p coiling of left ophthalmic aneurysm with EVD on 9/14. ETT 9/13-9/15.       SLP Plan  Continue with current plan of care       Recommendations  Diet recommendations: NPO Medication Administration: Via alternative means                Oral Care Recommendations: Oral care QID Follow up Recommendations: Inpatient Rehab SLP Visit Diagnosis: Dysphagia, oropharyngeal phase (R13.12) Plan: Continue with current plan of care       GO             [facility] MA, CCC-SLP [phone]    Zaydee Aina [name] 08/17/2017, 4:33 PM

## 2017-08-18 LAB — URINALYSIS, ROUTINE W REFLEX MICROSCOPIC
Bilirubin Urine: NEGATIVE
Glucose, UA: 50 mg/dL — AB
HGB URINE DIPSTICK: NEGATIVE
Ketones, ur: NEGATIVE mg/dL
Nitrite: NEGATIVE
Protein, ur: 100 mg/dL — AB
SPECIFIC GRAVITY, URINE: 1.012 (ref 1.005–1.030)
SQUAMOUS EPITHELIAL / LPF: NONE SEEN
pH: 5 (ref 5.0–8.0)

## 2017-08-18 LAB — PHOSPHORUS: Phosphorus: 2.2 mg/dL — ABNORMAL LOW (ref 2.5–4.6)

## 2017-08-18 LAB — BASIC METABOLIC PANEL
ANION GAP: 6 (ref 5–15)
BUN: 18 mg/dL (ref 6–20)
CALCIUM: 9.2 mg/dL (ref 8.9–10.3)
CO2: 19 mmol/L — ABNORMAL LOW (ref 22–32)
Chloride: 127 mmol/L — ABNORMAL HIGH (ref 101–111)
Creatinine, Ser: 0.49 mg/dL (ref 0.44–1.00)
GFR calc Af Amer: >60 mL/min (ref >60)
GLUCOSE: 203 mg/dL — AB (ref 65–99)
Potassium: 3.2 mmol/L — ABNORMAL LOW (ref 3.5–5.1)
SODIUM: 152 mmol/L — AB (ref 135–145)

## 2017-08-18 LAB — MAGNESIUM: Magnesium: 2.2 mg/dL (ref 1.7–2.4)

## 2017-08-18 LAB — CBC
HCT: 24.3 % — ABNORMAL LOW (ref 36.0–46.0)
HCT: 26.7 % — ABNORMAL LOW (ref 36.0–46.0)
HEMOGLOBIN: 8.2 g/dL — AB (ref 12.0–15.0)
Hemoglobin: 7.4 g/dL — ABNORMAL LOW (ref 12.0–15.0)
MCH: 26.6 pg (ref 26.0–34.0)
MCH: 26.8 pg (ref 26.0–34.0)
MCHC: 30.5 g/dL (ref 30.0–36.0)
MCHC: 30.7 g/dL (ref 30.0–36.0)
MCV: 87.4 fL (ref 78.0–100.0)
MCV: 87.3 fL (ref 78.0–100.0)
PLATELETS: 553 10*3/uL — AB (ref 150–400)
PLATELETS: 467 10*3/uL — AB (ref 150–400)
RBC: 2.78 MIL/uL — ABNORMAL LOW (ref 3.87–5.11)
RBC: 3.06 MIL/uL — AB (ref 3.87–5.11)
RDW: 19.6 % — ABNORMAL HIGH (ref 11.5–15.5)
RDW: 18.9 % — ABNORMAL HIGH (ref 11.5–15.5)
WBC: 30.2 10*3/uL — AB (ref 4.0–10.5)
WBC: 29.6 10*3/uL — AB (ref 4.0–10.5)

## 2017-08-18 LAB — ABO/RH: ABO/RH(D): O POS

## 2017-08-18 LAB — SODIUM
Sodium: 156 mmol/L — ABNORMAL HIGH (ref 135–145)
Sodium: 162 mmol/L (ref 135–145)
Sodium: 156 mmol/L — ABNORMAL HIGH (ref 135–145)

## 2017-08-18 LAB — PROCALCITONIN: PROCALCITONIN: 0.2 ng/mL

## 2017-08-18 LAB — PREPARE RBC (CROSSMATCH)

## 2017-08-18 MED ORDER — POTASSIUM CHLORIDE CRYS ER 20 MEQ PO TBCR
40.0000 meq | EXTENDED_RELEASE_TABLET | Freq: Two times a day (BID) | ORAL | Status: DC
  Filled 2017-08-18: qty 2

## 2017-08-18 MED ORDER — SODIUM BICARBONATE 650 MG PO TABS: 325.0000 mg | ORAL_TABLET | Freq: Three times a day (TID) | ORAL | Status: DC

## 2017-08-18 MED ORDER — POTASSIUM CHLORIDE 20 MEQ/15ML (10%) PO SOLN
40.0000 meq | Freq: Two times a day (BID) | ORAL | Status: AC
  Administered 2017-08-18 (×2): 40 meq
  Filled 2017-08-18 (×2): qty 30

## 2017-08-18 MED ORDER — JEVITY 1.2 CAL PO LIQD
1000.0000 mL | ORAL | Status: DC
  Administered 2017-08-18 – 2017-08-19 (×2): 1000 mL
  Filled 2017-08-18 (×5): qty 1000

## 2017-08-18 MED ORDER — SODIUM CHLORIDE 0.9 % IV SOLN
Freq: Once | INTRAVENOUS | Status: AC
  Administered 2017-08-18: 14:00:00 via INTRAVENOUS

## 2017-08-18 MED ORDER — RANITIDINE HCL 150 MG/10ML PO SYRP
150.0000 mg | ORAL_SOLUTION | Freq: Two times a day (BID) | ORAL | Status: DC
  Administered 2017-08-18 – 2017-09-01 (×28): 150 mg
  Filled 2017-08-18 (×31): qty 10

## 2017-08-18 NOTE — Progress Notes (Signed)
  Speech Language Pathology Treatment: Dysphagia;Cognitive-Linquistic  Patient Details Name: Suzanne Brooks MRN: [id number] DOB: 1976/06/03 Today's Date: 08/18/2017 Time: 4098-1191 SLP Time Calculation (min) (ACUTE ONLY): 14 min  Assessment / Plan / Recommendation Clinical Impression  Pt up in chair position at time of assessment, still persistently lethargic and minimally responsive despite max efforts. Pt did not initiate movement of UE for functional tasks despite hand over hand assist (was self feeding ice last week). She was able to follow command to squeeze SLP hand. Also responsive to ice chips to lips and masticated and initiated swallow though followed by wet respirations. Did not consistently follow commands to clear throat, effort poor. Pt still not appropriate for POs or repeat testing. Will follow for readiness.   HPI HPI: Pt is a 41 y/o female who presents with seizure activity. 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 ETT 9/13-9/15.       SLP Plan  Continue with current plan of care       Recommendations  Diet recommendations: NPO                Oral Care Recommendations: Oral care QID Follow up Recommendations: Inpatient Rehab SLP Visit Diagnosis: Dysphagia, oropharyngeal phase (R13.12) Plan: Continue with current plan of care       GO               [facility], MA CCC-SLP [phone]  Claudine Mouton 08/18/2017, 8:55 AM

## 2017-08-18 NOTE — Progress Notes (Signed)
Pt seen and examined. No issues overnight.   EXAM: Temp:  [99.5 F (37.5 C)-102.1 F (38.9 C)] 100.1 F (37.8 C) (10/02 1545) Pulse Rate:  [105-135] 124 (10/02 1700) Resp:  [16-34] 27 (10/02 1700) BP: (123-203)/(86-140) 167/109 (10/02 1700) SpO2:  [86 %-100 %] 100 % (10/02 1700) Weight:  [65.7 kg (144 lb 13.5 oz)] 65.7 kg (144 lb 13.5 oz) (10/02 0419) Intake/Output      10/01 0701 - 10/02 0700 10/02 0701 - 10/03 0700   I.V. (mL/kg) 4327.1 (65.9) 1621.8 (24.7)   Blood  315   NG/GT 1314.2 439.7   IV Piggyback 2000 500   Total Intake(mL/kg) 7641.3 (116.3) 2876.4 (43.8)   Urine (mL/kg/hr) 6575 (4.2) 3650 (5.4)   Drains 123 35   Stool 315    Total Output 7013 3685   Net +628.3 -808.6         Awake, alert, oriented to person Responds to simple questions CN grossly intact, ? Partial left ptosis >Antigravity BUE/LLE No movement RLE EVD in place  LABS: Lab Results  Component Value Date   CREATININE 0.49 08/18/2017   BUN 18 08/18/2017   NA 162 (HH) 08/18/2017   K 3.2 (L) 08/18/2017   CL 127 (H) 08/18/2017   CO2 19 (L) 08/18/2017   Lab Results  Component Value Date   WBC 30.2 (H) 08/18/2017   HGB 7.4 (L) 08/18/2017   HCT 24.3 (L) 08/18/2017   MCV 87.4 08/18/2017   PLT 553 (H) 08/18/2017    IMAGING: TCD reviewed, velocities decreased to near normal, normal Lindegaard ratio  IMPRESSION: - 41 y.o. female SAH d# 20 s/p left ophthalmic coiling, responding to hyperdynamic therapy, TCD appears to indicate resolving spasm - Cont to believe fevers are central, not infectious.  PLAN: - Cont hyperdynamic therapy for now, may back off after TCD tomorrow - Raise EVD to today

## 2017-08-18 NOTE — Progress Notes (Signed)
Nutrition Follow-up  INTERVENTION:   D/C Osmolite 1.2  Jevity 1.2 @ 50 ml/hr  30 ml Prostat daily Provides: 1900 kcal, 90 grams protein, and 914 ml free water.    NUTRITION DIAGNOSIS:   Inadequate oral intake related to inability to eat as evidenced by NPO status. Ongoing.   GOAL:   Patient will meet greater than or equal to 90% of their needs Met.   MONITOR:   TF tolerance, Weight trends, Labs  ASSESSMENT:   41 year old female who presented to ED with seizures found to be hypertensive with aneurysmal SAH, ICH. Coiled in IR 9/13. Post-op to ICU on vent.   Pt discussed during ICU rounds and with RN.  Per SLP pt remains too lethargic for repeat swallow eval for now.  C.diff negative, will try Jevity 1.2 IVD: 123 ml x 24 hrs  Medications reviewed and include: MVI, KDur, levophed, neo-synephrine, albumin 3% hypertonic saline @ 75 ml/hr Labs reviewed: Na 152 (H) on hypertonic saline, K+ 3.2 (L), PO4 2.2 (L)  TF: Osmolite 1.5 @ 50 ml/hr with 30 ml Prostat daily via NG tube Provides: 1900 kcal, 90 grams protein, and 914 ml free water.   Diet Order:    NPO  Skin:   (MASD buttocks)  Last BM:  315 ml via rectal tube x 24 hrs   Height:   Ht Readings from Last 1 Encounters:  07/30/17 5' 3"  (1.6 m)    Weight:   Wt Readings from Last 1 Encounters:  08/18/17 144 lb 13.5 oz (65.7 kg)    Ideal Body Weight:  52.2 kg  BMI:  Body mass index is 25.66 kg/m.  Estimated Nutritional Needs:   Kcal:  1880-2070 (30-33 kcal/kg)  Protein:  82-94 grams (1.3-1.5 grams/kg)  Fluid:  > 1.5 L/day  EDUCATION NEEDS:   No education needs identified at this time  [place], [place], [place] Pager [phone] After Hours Pager

## 2017-08-18 NOTE — Progress Notes (Signed)
Occupational Therapy Treatment Patient Details Name: Suzanne Brooks MRN: [id number] DOB: Dec 23, 1975 Today's Date: 08/18/2017    History of present illness Pt is a 41 y/o female who presents with seizure activity. Pt was found to have a subarachnoid hemorrhage, and is now s/p coiling of left ophthalmic aneurysm with EVD on 9/14.PMHx: HTN, Sz   OT comments  Pt very lethargic on arrival in the bed.  Worked with nursing to get pt to chair with [facility] since pt was not alert and responding well in bed.  Once in chair, pt's eyes were open and she was answering simple questions appropriately.  Pt required hand over hand assist to get tasks started but could finish simple adls once initiated by therapist.  Pt followed appx 75% of one step commands where eyes were open.  Feel pt remains a great rehab candidate once she is medically stable. Pt with large family.  Follow Up Recommendations  CIR;Supervision/Assistance - 24 hour    Equipment Recommendations  Other (comment) (TBD)    Recommendations for Other Services      Precautions / Restrictions Precautions Precautions: Fall Restrictions Weight Bearing Restrictions: No       Mobility Bed Mobility Overal bed mobility: Needs Assistance Bed Mobility: Rolling Rolling: Total assist         General bed mobility comments: Pt not attempting to help with bed mobilty.  Pt with good movement of L leg and movement noted in B UEs.  Transfers Overall transfer level: Needs assistance Equipment used: None (sky lift)             General transfer comment: unable to stand as pt so lethargic and not participating in bed mobilty.  Used [employer] and pt was much more alert in sitting.     Balance Overall balance assessment: Needs assistance Sitting-balance support: Feet supported;Bilateral upper extremity supported Sitting balance-Leahy Scale: Zero   Postural control: Posterior lean                                 ADL either  performed or assessed with clinical judgement   ADL Overall ADL's : Needs assistance/impaired Eating/Feeding: NPO   Grooming: Wash/dry face;Maximal assistance;Sitting Grooming Details (indicate cue type and reason): hand over hand assist to wash face but then able to continue task for a few moments without assist.                             Functional mobility during ADLs: Total assistance;+2 for physical assistance General ADL Comments: Pt completed simple adls, washing face, swabbing mouth and rubbing in lotion with hand over hand to initiate, then did have some follow through. Pt continues to be very lethargic during session but much more awake in sitting.      Vision   Vision Assessment?: Vision impaired- to be further tested in functional context Additional Comments: Pt with R inattention and L gaze preference.   Perception     Praxis      Cognition Arousal/Alertness: Lethargic Behavior During Therapy: Flat affect Overall Cognitive Status: Impaired/Different from baseline Area of Impairment: Problem solving;Following commands                 Orientation Level: Disoriented to;Time Current Attention Level: Focused Memory: Decreased recall of precautions Following Commands: Follows one step commands inconsistently;Follows one step commands with increased time Safety/Judgement: Decreased awareness of safety;Decreased awareness of  deficits Awareness: Intellectual Problem Solving: Slow processing;Decreased initiation;Difficulty sequencing;Requires verbal cues;Requires tactile cues General Comments: Pt more alert up in chair with eyes open for appx 5 minutes.  Pt answered yes/no questions appropriately and followed simple one step commands. Pt was not doing this when supine.        Exercises Exercises: Other exercises General Exercises - Upper Extremity Shoulder Flexion: AROM;Both;10 reps;Seated Shoulder Extension: AROM;Both;10 reps;Seated Elbow Flexion:  AAROM;Both;10 reps;Seated Elbow Extension: AAROM;Both;10 reps;Seated Digit Composite Flexion: AROM;Both;10 reps;Seated Composite Extension: AAROM;Both;10 reps;Seated   Shoulder Instructions       General Comments Pt cognitively showing some improvements.  Much better to work with pt in chair.  Used lift to get pt into chair.    Pertinent Vitals/ Pain       Pain Assessment: Faces Faces Pain Scale: Hurts a little bit Pain Location: movement of neck to midline Pain Descriptors / Indicators: Grimacing;Discomfort Pain Intervention(s): Monitored during session;Repositioned  Home Living                                          Prior Functioning/Environment              Frequency  Min 2X/week        Progress Toward Goals  OT Goals(current goals can now be found in the care plan section)  Progress towards OT goals: Progressing toward goals  Acute Rehab OT Goals Patient Stated Goal: none stated OT Goal Formulation: With patient Time For Goal Achievement: 09/01/17 Potential to Achieve Goals: Good ADL Goals Pt Will Perform Grooming: with min guard assist;sitting Pt Will Transfer to Toilet: with min assist;stand pivot transfer;bedside commode Pt Will Perform Toileting - Clothing Manipulation and hygiene: with min assist;sit to/from stand Additional ADL Goal #1: Pt will sit EOB x10 minutes with min guard assist as precursor to ADL.  Plan Discharge plan remains appropriate    Co-evaluation                 AM-PAC PT "6 Clicks" Daily Activity     Outcome Measure   Help from another person eating meals?: Total Help from another person taking care of personal grooming?: Total Help from another person toileting, which includes using toliet, bedpan, or urinal?: Total Help from another person bathing (including washing, rinsing, drying)?: Total Help from another person to put on and taking off regular upper body clothing?: Total Help from another  person to put on and taking off regular lower body clothing?: Total 6 Click Score: 6    End of Session    OT Visit Diagnosis: Muscle weakness (generalized) (M62.81);Other abnormalities of gait and mobility (R26.89)   Activity Tolerance Patient limited by lethargy   Patient Left in chair;with call bell/phone within reach;with restraints reapplied   Nurse Communication Mobility status        Time: 0930-1004 OT Time Calculation (min): 34 min  Charges: OT General Charges $OT Visit: 1 Visit OT Treatments $Self Care/Home Management : 8-22 mins $Therapeutic Activity: 8-22 mins  Tory Emerald, OTR/L   Hope Budds 08/18/2017, 10:22 AM

## 2017-08-18 NOTE — Progress Notes (Signed)
PULMONARY / CRITICAL CARE MEDICINE   Name: Suzanne Brooks MRN: [id number] DOB: 03-11-1976    ADMISSION DATE:  07/30/2017 CONSULTATION DATE:  07/30/2017  REFERRING MD:  Conchita Paris  CHIEF COMPLAINT:  SAH  HISTORY OF PRESENT ILLNESS:  41 y.o. female presented with seizures and found to have SAH and Lt frontal lobe ICH.  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 PMHx of HTN.  SUBJECTIVE:  Tmax 99.5.  PCT negative.  3%NS started per NSGY.  RN reports pt drowsy, unable to cooperate with SLP efforts.    VITAL SIGNS: BP (!) 152/95   Pulse (!) 117   Temp 100 F (37.8 C) (Oral)   Resp (!) 27   Ht  (1.6 m)   Wt 144 lb 13.5 oz (65.7 kg)   LMP  (LMP Unknown) Comment: neg preg test 07/30/17  SpO2 99%   BMI 25.66 kg/m   INTAKE / OUTPUT: I/O last 3 completed shifts: In: 11276.3 [I.V.:6437.1; NG/GT:1839.2; IV Piggyback:3000] Out: 9730 [Urine:9075; Drains:240; Stool:415]  PHYSICAL EXAMINATION: General: ill appearing adult female in NAD  HEENT: MM pink/moist, no JVD, EVD drain in place Neuro: awakens to voice, does not follow commands this am CV: s1s2 rrr, SEM 2nd ICS RSB PULM: even/non-labored, lungs bilaterally clear MV:HQIO, non-tender, bsx4 active  Extremities: warm/dry, no edema  Skin: no rashes or lesions  LABS:  BMET  Recent Labs Lab 08/16/17 1915 08/17/17 0420  08/17/17 1948 08/18/17 0146 08/18/17 0729  NA 137 141  < > 148* 152* 156*  K 3.6 3.3*  --   --  3.2*  --   CL 107 113*  --   --  127*  --   CO2 22 21*  --   --  19*  --   BUN 18 26*  --   --  18  --   CREATININE 0.46 0.61  --   --  0.49  --   GLUCOSE 120* 106*  --   --  203*  --   < > = values in this interval not displayed. Electrolytes  Recent Labs Lab 08/16/17 0427 08/16/17 1915 08/17/17 0420 08/18/17 0146  CALCIUM 9.5 9.6 9.6 9.2  MG 2.1  --  2.3 2.2  PHOS 3.4  --  3.9 2.2*   CBC  Recent Labs Lab 08/16/17 0427 08/17/17 0420 08/18/17 0146  WBC 19.8* 25.7* 30.2*  HGB  7.1* 8.2* 7.4*  HCT 23.4* 26.0* 24.3*  PLT 788* 730* 553*   Coag's No results for input(s): APTT, INR in the last 168 hours.   Sepsis Markers  Recent Labs Lab 08/13/17 0236 08/17/17 1225 08/18/17 0146  PROCALCITON <0.10 0.19 0.20    Glucose  Recent Labs Lab 08/14/17 1228 08/14/17 1525 08/14/17 2010 08/14/17 2353 08/15/17 0352 08/15/17 0722  GLUCAP 109* 142* 120* 108* 125* 142*   Imaging No results found.   STUDIES:  UDS 9/13:  Negative  CT HEAD W/O 9/13: IMPRESSION: 1. Extensive subarachnoid hemorrhage throughout the basilar cisterns, sylvian fissure and interhemispheric fissure question ruptured aneurysm. 2. Additional intra cerebral hematoma within the inferior LEFT frontal lobe 3.5 x 1.4 x 1.8 cm, un common to have intraparenchymal extension of subarachnoid hemorrhage, question concomitant or subsequent fall with traumatic hemorrhage? CT HEAD W/O 9/17: IMPRESSION: 1. Satisfactory extraventricular drainage catheter placement. Slight decrease in ventricular size, particularly temporal horns. Resolving parenchymal and subarachnoid blood. 2. No visible cortical infarction, but cannot exclude early ischemic change in the LEFT posterior limb internal capsule, as well  as scattered areas of the LEFT frontal subcortical white matter. CSF 9/20:  RBC 14025 / Glucose 74 / Total Protein 43 / WBC 45 (neutrophils 88%, lymphocytes 8%, & monocytes 4%) CT HEAD W/O 9/27: IMPRESSION: 1. 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 Residual intraventricular hemorrhage. 2. 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 3. Mild leptomeningeal enhancement presumably from recent subarachnoid hemorrhage. CTA HEAD 9/27: IMPRESSION: 1. Severe vasospasm of anterior greater than posterior circulation. No emergent large vessel occlusion. 2. Status post LEFT paraophthalmic aneurysm coil embolization. 3. 3 mm RIGHT paraophthalmic aneurysm. PORT CXR  9/29:  Personally reviewed by me. Right upper extremity PICC line and change to position. No focal opacity or effusion appreciated. Normal mediastinal contour and heart border.  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 Started on albumin for vasospasm by neurosurgery. 09/30 - On Neo-Synephrine at 400. More awake & intermittently following commands. Nonsensical speech. 10/01 - NSGY SBP goal 180-200.  Waxing / waning mental status. Pt pulled aline, NGT out overnight.    LINES/TUBES: OETT 9/13 - 9/15 EVD 9/14 >>> RUE DL PICC 7/82 >>> L RAD ART LINE 9/28 >>> 10/1 (pt pulled) FOLEY 9/28 >>> L NGT >>> PIV  ASSESSMENT / PLAN:  41 y.o. female with extensive subarachnoid hemorrhage and left intracerebral hematoma/hemorrhage. Status post ophthalmic artery aneurysm coiling. Has ongoing altered mentation and fever.    Left intracerebral hemorrhage/hematoma & extensive subarachnoid hemorrhage  P: EVD drain per NSGY  Continue nimodipine, Keppra, Albumin SBP goal 180-200 per NSGY  Frequent neuro exams Continue levophed for BP goals   Vasospasm  P: Nimodipine + Albumin per NSGY  Levophed to support BP goals Wean neo to off    Acute encephalopathy - multifactorial.  P: Supportive care PT efforts Minimize sedating medications   Seizure  P: Keppra BID  Seizure precautions   Fever of unknown origin - likely centrally mediated, r/o infection, WBC  rising P: Follow cultures as above Trend PCT  Follow CSF culture    Dysphagia  P: SLP following NPO  TF per Nutrition    Diarrhea - C-diff negative, suspect secondary to TF P: Monitor stool volume, BMP   Hypokalemia Hyperchloremic Acidosis  P: Trend BMP KCL 40 mEq BID x2 doses   Anemia - without evidence of bleeding  P: Trend CBC Transfuse 1 unit PRBC (admit Hgb >9)   Hyperglycemia  P: Monitor glucose on BMP   Prophylaxis: SCDs & Zantac twice a day. Diet: NPO, TF per Nutrition. Code Status: Full code  Disposition: ICU Family Update:  No family available am 10/2 on NP rounding.     CC Time: 30 minutes   Canary Brim, NP-C [facility] Pgr: [phone] or if no answer [phone] 08/18/2017, 9:01 AM

## 2017-08-18 NOTE — Progress Notes (Signed)
Physical Therapy Treatment Patient Details Name: Suzanne Brooks MRN: [id number] DOB: Jul 31, 1976 Today's Date: 08/18/2017    History of Present Illness Pt is a 41 y/o female who presents with seizure activity. Pt was found to have a subarachnoid hemorrhage, and is now s/p coiling of left ophthalmic aneurysm with EVD on 9/14.PMHx: HTN, Sz    PT Comments    Pt is progressing slowly, but getting back to more like she was earlier last week.  She is spontaneously moving her extremities (except right lower) and communicating when aroused (which often take sitting her up EOB or for today, sitting her up off of the back of the recliner chair).  Pt continues to be appropriate for CIR level therapies and seems to be doing well in the recliner chair (OT used maxi sky lift to get her up OOB today).  PT will continue to follow acutely.   Follow Up Recommendations  CIR;Supervision/Assistance - 24 hour     Equipment Recommendations  Wheelchair (measurements PT);Wheelchair cushion (measurements PT)    Recommendations for Other Services Rehab consult     Precautions / Restrictions Precautions Precautions: Fall Precaution Comments: clamp EVD prior to moving the bed/mobilizing pt.  Restrictions Weight Bearing Restrictions: No    Mobility  Bed Mobility Overal bed mobility: Needs Assistance Bed Mobility: Rolling Rolling: Total assist         General bed mobility comments: Pt OOB in the recliner chair.  RN reports she has not quite been up an hour and would like to have her up a bit longer  Transfers Overall transfer level: Needs assistance Equipment used: None (sky lift)             General transfer comment: unable to stand as pt so lethargic and not participating in bed mobilty.  Used [employer] and pt was much more alert in sitting.          Balance Overall balance assessment: Needs assistance Sitting-balance support: Feet supported;Bilateral upper extremity supported Sitting  balance-Leahy Scale: Zero Sitting balance - Comments: initially max assist due to pt pushing posteriorly, once hands placed in lap, pt was able to get to min/mod assist sitting with trunk unsupported in chair.  Difficulty lifting head to look up (cervical spine very flexed).  Pt reporting she feels like she is going to fall sitting upright in midline, but better with continued time upright.  Pt needed frequent verbal cues to focus attention and find me with her eyes.   Postural control: Posterior lean     Standing balance comment: not attempted today due to only one person assist and would need two to be safe.                              Cognition Arousal/Alertness: Lethargic Behavior During Therapy: Flat affect Overall Cognitive Status: Impaired/Different from baseline Area of Impairment: Problem solving;Following commands                 Orientation Level: Disoriented to;Time Current Attention Level: Focused Memory: Decreased recall of precautions Following Commands: Follows one step commands inconsistently;Follows one step commands with increased time Safety/Judgement: Decreased awareness of safety;Decreased awareness of deficits Awareness: Intellectual Problem Solving: Slow processing;Decreased initiation;Difficulty sequencing;Requires verbal cues;Requires tactile cues General Comments: Pt awkens to name, more alert once trunk off of back of recliner chair.        Exercises General Exercises - Upper Extremity Shoulder Flexion: AROM;Both;10 reps;Seated Shoulder Extension: AROM;Both;10 reps;Seated  Elbow Flexion: AAROM;Both;10 reps;Seated Elbow Extension: AAROM;Both;10 reps;Seated Digit Composite Flexion: AROM;Both;10 reps;Seated Composite Extension: AAROM;Both;10 reps;Seated General Exercises - Lower Extremity Ankle Circles/Pumps: PROM;Both;10 reps Heel Slides: PROM;Both;10 reps Hip ABduction/ADduction: PROM;Both;10 reps Other Exercises Other Exercises:  Certainly less tone noted in right leg during PROM.  Pt also seen spontaneously moving bil arms and left leg throughout session.     General Comments General comments (skin integrity, edema, etc.): Pt cognitively showing some improvements.  Much better to work with pt in chair.  Used lift to get pt into chair.      Pertinent Vitals/Pain Pain Assessment: Faces Faces Pain Scale: Hurts a little bit Pain Location: "all over" "my butt" Pain Descriptors / Indicators: Grimacing;Discomfort Pain Intervention(s): Limited activity within patient's tolerance;Monitored during session;Repositioned           PT Goals (current goals can now be found in the care plan section) Acute Rehab PT Goals Patient Stated Goal: none stated Progress towards PT goals: Progressing toward goals    Frequency    Min 3X/week      PT Plan Current plan remains appropriate       AM-PAC PT "6 Clicks" Daily Activity  Outcome Measure  Difficulty turning over in bed (including adjusting bedclothes, sheets and blankets)?: Unable Difficulty moving from lying on back to sitting on the side of the bed? : Unable Difficulty sitting down on and standing up from a chair with arms (e.g., wheelchair, bedside commode, etc,.)?: Unable Help needed moving to and from a bed to chair (including a wheelchair)?: Total Help needed walking in hospital room?: Total Help needed climbing 3-5 steps with a railing? : Total 6 Click Score: 6    End of Session   Activity Tolerance: Patient limited by fatigue Patient left: in chair;with call bell/phone within reach;with chair alarm set;with nursing/sitter in room Nurse Communication: Mobility status PT Visit Diagnosis: Muscle weakness (generalized) (M62.81);Other symptoms and signs involving the nervous system (R29.898);Other abnormalities of gait and mobility (R26.89)     Time: 0865-7846 PT Time Calculation (min) (ACUTE ONLY): 22 min  Charges:  $Therapeutic Activity: 8-22  mins          Nakota Ackert B. Simon Llamas, PT, DPT [phone]            08/18/2017, 11:55 AM

## 2017-08-18 NOTE — Progress Notes (Signed)
CRITICAL VALUE ALERT  Critical Value: Sodium-162  Date & Time Notied: 08/18/2017 1230  Provider Notified: MD Warren Lacy  Orders Received/Actions taken: D/C 3% NaCl; Increase NS to 16ml/hr

## 2017-08-19 ENCOUNTER — Inpatient Hospital Stay (HOSPITAL_BASED_OUTPATIENT_CLINIC_OR_DEPARTMENT_OTHER): Payer: Medicaid Other

## 2017-08-19 ENCOUNTER — Inpatient Hospital Stay (HOSPITAL_COMMUNITY): Payer: Medicaid Other

## 2017-08-19 DIAGNOSIS — I609 Nontraumatic subarachnoid hemorrhage, unspecified: Secondary | ICD-10-CM

## 2017-08-19 LAB — SODIUM
SODIUM: 145 mmol/L (ref 135–145)
SODIUM: 146 mmol/L — AB (ref 135–145)
SODIUM: 153 mmol/L — AB (ref 135–145)

## 2017-08-19 LAB — BASIC METABOLIC PANEL
Anion gap: 9 (ref 5–15)
BUN: 16 mg/dL (ref 6–20)
CALCIUM: 10.5 mg/dL — AB (ref 8.9–10.3)
CHLORIDE: 123 mmol/L — AB (ref 101–111)
CO2: 21 mmol/L — ABNORMAL LOW (ref 22–32)
CREATININE: 0.48 mg/dL (ref 0.44–1.00)
Glucose, Bld: 155 mg/dL — ABNORMAL HIGH (ref 65–99)
Potassium: 3 mmol/L — ABNORMAL LOW (ref 3.5–5.1)
SODIUM: 153 mmol/L — AB (ref 135–145)

## 2017-08-19 LAB — TYPE AND SCREEN
ABO/RH(D): O POS
Antibody Screen: NEGATIVE
Unit division: 0

## 2017-08-19 LAB — BPAM RBC
Blood Product Expiration Date: 201810242359
ISSUE DATE / TIME: 201810021324
UNIT TYPE AND RH: 5100

## 2017-08-19 LAB — CBC
HCT: 25.5 % — ABNORMAL LOW (ref 36.0–46.0)
Hemoglobin: 8 g/dL — ABNORMAL LOW (ref 12.0–15.0)
MCH: 27.5 pg (ref 26.0–34.0)
MCHC: 31.4 g/dL (ref 30.0–36.0)
MCV: 87.6 fL (ref 78.0–100.0)
Platelets: 403 10*3/uL — ABNORMAL HIGH (ref 150–400)
RBC: 2.91 MIL/uL — ABNORMAL LOW (ref 3.87–5.11)
RDW: 19.7 % — AB (ref 11.5–15.5)
WBC: 28.8 10*3/uL — ABNORMAL HIGH (ref 4.0–10.5)

## 2017-08-19 LAB — PHOSPHORUS: PHOSPHORUS: 2.8 mg/dL (ref 2.5–4.6)

## 2017-08-19 LAB — PROCALCITONIN: PROCALCITONIN: 0.15 ng/mL

## 2017-08-19 LAB — MAGNESIUM: MAGNESIUM: 2.3 mg/dL (ref 1.7–2.4)

## 2017-08-19 MED ORDER — POTASSIUM CHLORIDE 10 MEQ/100ML IV SOLN
10.0000 meq | INTRAVENOUS | Status: AC
  Administered 2017-08-19 (×4): 10 meq via INTRAVENOUS
  Filled 2017-08-19 (×4): qty 100

## 2017-08-19 MED ORDER — POTASSIUM CHLORIDE 20 MEQ PO PACK
20.0000 meq | PACK | Freq: Every day | ORAL | Status: DC
  Administered 2017-08-19 – 2017-09-01 (×13): 20 meq via ORAL
  Filled 2017-08-19 (×13): qty 1

## 2017-08-19 NOTE — Progress Notes (Signed)
Transcranial Doppler  Date POD PCO2 HCT BP  MCA ACA PCA OPHT SIPH VERT Basilar  9/14 vs     Right  Left   49  44   -14  -44   21  37   -30  -35     -37    9/17 je     Right  Left   79  143   -45  -67   12  32   31  19   109  42   -60  -37   -54      9/19rs     Right  Left   164  181   -38  -71   48  66   29  26   59  46   -31  *   *      9/21rs      Right  Left   113  166   -35  -98   40  49  45   -42  -38   -90      9/24rs      Right  Left   191  188   -38  -39   37  25   39  28   33  36   -71  -49   -52      9/26 rs     Right  Left   203  164   -40  -44   72  52   46  36   37  177   -49  -80   -67      9/28,rs     Right  Left   176  95   -46  -54   76  53   28  22   59  184   -17  -20   -55      10/1rs     Right  67  -32  25  26  137  -34  -45       Left 70 -39 38 25 119 -33   10/3rs     Right 47 -37 37 30 64 -39 -41       Left 59 -38 33 25 103 -41    MCA = Middle Cerebral Artery      OPHT = Opthalmic Artery     BASILAR = Basilar Artery   ACA = Anterior Cerebral Artery     SIPH = Carotid Siphon PCA = Posterior Cerebral Artery   VERT = Verterbral Artery                   Normal MCA = 62+\-12 ACA = 50+\-12 PCA = 42+\-23   9/17 [place] ratio: right 4.4 left 4.6. je 9/19 - [place] ratio: right 3.4, left 6.0. (*) not insonated. RDS 9/21 - [place]  Ratio: right 3.8, left 7.2 - rds 9/24 - [place] Ratio: right, 4.7. Left 6.7 - rds 9/26 - [place] Ratio: right 6.5, left 6.5 - rds 9/28 - [place] Ratio: right 8.0, left 3.6 rds 10/1-  [place]ard Ratio: right 1.4. Left 2.5, rds. 10/3 - [place]ard Ratio: right 1.3, Left 1.6

## 2017-08-19 NOTE — Progress Notes (Signed)
Cortrak Tube Team Note:  Consult received to place a Cortrak feeding tube.   A 10 F Cortrak tube was placed in the right nare and secured with a nasal bridle at 75 cm. Per the Cortrak monitor reading the tube tip is post pyloric.   No x-ray is required. RN may begin using tube.   If the tube becomes dislodged please keep the tube and contact the Cortrak team at [email or web address] (password [email or web address]) for replacement.  If after hours and replacement cannot be delayed, place a NG tube and confirm placement with an abdominal x-ray.   Vanessa Kick RD, LDN Clinical Nutrition Pager # [phone]

## 2017-08-19 NOTE — Progress Notes (Signed)
PULMONARY / CRITICAL CARE MEDICINE   Name: Suzanne Brooks MRN: [id number] DOB: 04/28/1976    ADMISSION DATE:  07/30/2017 CONSULTATION DATE:  07/30/2017  REFERRING MD:  Conchita Paris  CHIEF COMPLAINT:  SAH  HISTORY OF PRESENT ILLNESS:  41 y.o. female presented with seizures and found to have SAH and Lt frontal lobe ICH.  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 PMHx of HTN.  SUBJECTIVE:  Post bleed day20. Still on alfa agonists for spasm.  Tmax 102 in last 24 hours.   VITAL SIGNS: BP (!) 173/118   Pulse (!) 116   Temp 98.4 F (36.9 C) (Oral)   Resp 19   Ht  (1.6 m)   Wt 141 lb 8.6 oz (64.2 kg)   LMP  (LMP Unknown) Comment: neg preg test 07/30/17  SpO2 99%   BMI 25.07 kg/m   INTAKE / OUTPUT: I/O last 3 completed shifts: In: 11416.3 [I.V.:6461.6; Blood:315; NG/GT:2139.7; IV Piggyback:2500] Out: 40981 [Urine:11850; Drains:114; Stool:275]  PHYSICAL EXAMINATION: General:  Adult female in NAD, lying in bed HEENT: MM pink/moist, no JVD, EVD drain in place. EVD at +15 Neuro: Awake, alert, nods appropriately to questions. Very soft voice. Follows instructions. occasionally oriented, moves all ext's to command except RLE CV: s1s2 rrr Resp: even/non-labored, lungs bilaterally clear  XB:JYNW, non-tender, bsx4 active  Extremities: warm/dry, no edema  Skin: no rashes or lesions  LABS:  BMET  Recent Labs Lab 08/17/17 0420  08/18/17 0146  08/18/17 1728 08/19/17 0048 08/19/17 0605  NA 141  < > 152*  < > 156* 153* 153*  K 3.3*  --  3.2*  --   --   --  3.0*  CL 113*  --  127*  --   --   --  123*  CO2 21*  --  19*  --   --   --  21*  BUN 26*  --  18  --   --   --  16  CREATININE 0.61  --  0.49  --   --   --  0.48  GLUCOSE 106*  --  203*  --   --   --  155*  < > = values in this interval not displayed. Electrolytes  Recent Labs Lab 08/17/17 0420 08/18/17 0146 08/19/17 0605  CALCIUM 9.6 9.2 10.5*  MG 2.3 2.2 2.3  PHOS 3.9 2.2* 2.8   CBC  Recent  Labs Lab 08/18/17 0146 08/18/17 1728 08/19/17 0605  WBC 30.2* 29.6* 28.8*  HGB 7.4* 8.2* 8.0*  HCT 24.3* 26.7* 25.5*  PLT 553* 467* 403*   Coag's No results for input(s): APTT, INR in the last 168 hours. Sepsis Markers  Recent Labs Lab 08/17/17 1225 08/18/17 0146 08/19/17 0605  PROCALCITON 0.19 0.20 0.15    Glucose  Recent Labs Lab 08/14/17 1228 08/14/17 1525 08/14/17 2010 08/14/17 2353 08/15/17 0352 08/15/17 0722  GLUCAP 109* 142* 120* 108* 125* 142*   Imaging Dg Chest Port 1 View  Result Date: 08/19/2017 CLINICAL DATA:  Subarachnoid hemorrhage. EXAM: PORTABLE CHEST 1 VIEW COMPARISON:  Portable chest x-ray of August 15, 2017 FINDINGS: The lungs are reasonably well inflated. There is no focal infiltrate. Subsegmental atelectasis at the lung bases is suspected. The cardiac silhouette is enlarged. The pulmonary vascularity is not engorged. The right-sided PICC line tip projects at the cavoatrial junction. The esophagogastric tube tip in proximal port project below the GE junction. IMPRESSION: Mild bibasilar atelectasis more conspicuous today. Stable mild cardiomegaly without significant pulmonary  vascular congestion. Electronically Signed   By: David  [place] M.D.   On: 08/19/2017 07:37    STUDIES:  UDS 9/13:  Negative  CT HEAD W/O 9/13: IMPRESSION: 1. Extensive subarachnoid hemorrhage throughout the basilar cisterns, sylvian fissure and interhemispheric fissure question ruptured aneurysm. 2. Additional intra cerebral hematoma within the inferior LEFT frontal lobe 3.5 x 1.4 x 1.8 cm, un common to have intraparenchymal extension of subarachnoid hemorrhage, question concomitant or subsequent fall with traumatic hemorrhage? CT HEAD W/O 9/17: IMPRESSION: 1. Satisfactory extraventricular drainage catheter placement. Slight decrease in ventricular size, particularly temporal horns. Resolving parenchymal and subarachnoid blood. 2. No visible cortical infarction, but cannot  exclude early ischemic change in the LEFT posterior limb internal capsule, as well as scattered areas of the LEFT frontal subcortical white matter. CSF 9/20:  RBC 14025 / Glucose 74 / Total Protein 43 / WBC 45 (neutrophils 88%, lymphocytes 8%, & monocytes 4%) CT HEAD W/O 9/27: IMPRESSION: 1. 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 Residual intraventricular hemorrhage. 2. 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 3. Mild leptomeningeal enhancement presumably from recent subarachnoid hemorrhage. CTA HEAD 9/27: IMPRESSION: 1. Severe vasospasm of anterior greater than posterior circulation. No emergent large vessel occlusion. 2. Status post LEFT paraophthalmic aneurysm coil embolization. 3. 3 mm RIGHT paraophthalmic aneurysm. PORT CXR 9/29:  Personally reviewed by me. Right upper extremity PICC line and change to position. No focal opacity or effusion appreciated. Normal mediastinal contour and heart border.  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 Started on albumin for vasospasm by neurosurgery. 09/30 - On Neo-Synephrine at 400. More awake & intermittently following commands. Nonsensical speech.  LINES/TUBES: OETT 9/13 - 9/15 EVD 9/14 >>> RUE DL PICC 5/40 >>> L RAD ART LINE 9/28 >>> 10/1 (pt pulled) FOLEY 9/28 >>> L NGT >>> PIV  ASSESSMENT / PLAN: Day 20 post SAH. S/P opthalmic artery coiling 41 y.o. female with extensive subarachnoid hemorrhage and  left intracerebral hematoma/hemorrhage. Status post ophthalmic artery aneurysm coiling. Has ongoing altered mentation and fever.    Left intracerebral hemorrhage/hematoma & extensive subarachnoid hemorrhage. Spasm by TCD's  P: EVD drain per NSGY  Continue nimodipine, Keppra, albumin  SBP goal 180-200 per NSGY Frequent neuro exams Change neo to levophed for BP goals  If unable to meet goal with levophed, consider florinef    Vasospasm  P: Continue nimodipine, alpha agonists + albumin as above SBP per NSGY     Seizure on presentation. ACD's are not prophylactic P: Seizure precautions  Keppra BID    Fever of unknown origin - likely centrally mediated, No indication of systemic infection. Follow cultures as above  Trend PCT   Dysphagia  P: SLP following NPO  TF per Nutrition    Diarrhea - C-diff negative, suspect secondary to TF P: Monitor volume, BMP   Hypokalemia P: Monitor, replace as indicated    Anemia - without evidence of bleeding  P: Trend CBC    Hyperglycemia  P: Monitor glucose on BMP   Prophylaxis: SCDs &  Zantac twice a day. Diet: NPO, TF per Nutrition. Code Status: Full code  Disposition: Remains critically ill in the ICU. Family Update:    Penny Pia, MD [facility] Pgr: [phone] or if no answer [phone] 08/19/2017, 9:14 AM

## 2017-08-19 NOTE — Progress Notes (Signed)
Spoke with Dr. Conchita Paris regarding Na+ level result. No new orders at this time.

## 2017-08-19 NOTE — Progress Notes (Signed)
Pt seen and examined. No issues overnight.   EXAM: Temp:  [98.4 F (36.9 C)-102.1 F (38.9 C)] 98.8 F (37.1 C) (10/03 0800) Pulse Rate:  [105-135] 114 (10/03 0930) Resp:  [18-34] 22 (10/03 0930) BP: (126-215)/(83-140) 185/115 (10/03 0930) SpO2:  [86 %-100 %] 99 % (10/03 0930) Weight:  [64.2 kg (141 lb 8.6 oz)] 64.2 kg (141 lb 8.6 oz) (10/03 0401) Intake/Output      10/02 0701 - 10/03 0700 10/03 0701 - 10/04 0700   I.V. (mL/kg) 4378.9 (68.2) 100 (1.6)   Blood 315    NG/GT 1479.7    IV Piggyback 1500    Total Intake(mL/kg) 7673.5 (119.5) 100 (1.6)   Urine (mL/kg/hr) 7700 (5) 950 (4.9)   Drains 66 0   Stool 100    Total Output 7866 950   Net -192.5 -850          Easily arousable Says name Moves BUE/LLE well No movement RLE EVD in place @ , 66cc x 24hrs  LABS: Lab Results  Component Value Date   CREATININE 0.48 08/19/2017   BUN 16 08/19/2017   NA 153 (H) 08/19/2017   K 3.0 (L) 08/19/2017   CL 123 (H) 08/19/2017   CO2 21 (L) 08/19/2017   Lab Results  Component Value Date   WBC 28.8 (H) 08/19/2017   HGB 8.0 (L) 08/19/2017   HCT 25.5 (L) 08/19/2017   MCV 87.6 08/19/2017   PLT 403 (H) 08/19/2017    IMPRESSION: - 41 y.o. female SAH d# 21 s/p left ophthalmic coiling, appears to be neurologically stable on hyperdynamic therapy. Would suspect spasm is resolving. No clinical suspicion for HCP.  PLAN: - Will check another TCD today. If velocities remain low, will back off pressors, clamp EVD

## 2017-08-20 LAB — BASIC METABOLIC PANEL
ANION GAP: 14 (ref 5–15)
BUN: 25 mg/dL — AB (ref 6–20)
CALCIUM: 9.8 mg/dL (ref 8.9–10.3)
CO2: 15 mmol/L — ABNORMAL LOW (ref 22–32)
Chloride: 122 mmol/L — ABNORMAL HIGH (ref 101–111)
Creatinine, Ser: 0.59 mg/dL (ref 0.44–1.00)
GFR calc Af Amer: >60 mL/min (ref >60)
GLUCOSE: 172 mg/dL — AB (ref 65–99)
POTASSIUM: 3.5 mmol/L (ref 3.5–5.1)
SODIUM: 151 mmol/L — AB (ref 135–145)

## 2017-08-20 LAB — SODIUM
SODIUM: 150 mmol/L — AB (ref 135–145)
SODIUM: 152 mmol/L — AB (ref 135–145)
Sodium: 151 mmol/L — ABNORMAL HIGH (ref 135–145)

## 2017-08-20 MED ORDER — VITAL 1.5 CAL PO LIQD
1000.0000 mL | ORAL | Status: AC
  Administered 2017-08-20 – 2017-08-26 (×7): 1000 mL
  Filled 2017-08-20 (×12): qty 1000

## 2017-08-20 NOTE — Progress Notes (Signed)
PULMONARY / CRITICAL CARE MEDICINE   Name: Suzanne Brooks MRN: [id number] DOB: Nov 13, 1976    ADMISSION DATE:  07/30/2017 CONSULTATION DATE:  07/30/2017  REFERRING MD:  Conchita Paris  CHIEF COMPLAINT:  SAH  HISTORY OF PRESENT ILLNESS:  41 y.o. female presented with seizures and found to have SAH and Lt frontal lobe ICH.  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 PMHx of HTN.  SUBJECTIVE:  Post bleed day 21. Still on alfa agonists for spasm.  TCD's showing minimal spasm yesterday. Liquid stool persists despite switching to Jevity yesterday. Nods  To questions, does not verbalize for me   VITAL SIGNS: BP (!) 186/117   Pulse (!) 111   Temp 99 F (37.2 C) (Oral)   Resp 17   Ht  (1.6 m)   Wt 138 lb 14.2 oz (63 kg)   LMP  (LMP Unknown) Comment: neg preg test 07/30/17  SpO2 99%   BMI 24.60 kg/m   INTAKE / OUTPUT: I/O last 3 completed shifts: In: 11645.2 [I.V.:6005.2; NG/GT:2240; IV Piggyback:3400] Out: 40981 [Urine:9500; Drains:38; Stool:700]  PHYSICAL EXAMINATION: General:  Adult female in NAD, lying in bed HEENT: MM pink/moist, no JVD, EVD drain in place. EVD at +15 Neuro: Awake, alert, nods appropriately to questions. Not talking today. Pupils equal, EOM's full. Not moving right as vigorously as left CV: s1s2 rrr Resp: even/non-labored, lungs bilaterally clear  XB:JYNW, non-tender, bsx4 active  Extremities: warm/dry, no edema  Skin: no rashes or lesions  LABS:  BMET  Recent Labs Lab 08/18/17 0146  08/19/17 0605  08/19/17 1816 08/20/17 0129 08/20/17 0537  NA 152*  < > 153*  < > 145 150* 151*  K 3.2*  --  3.0*  --   --   --  3.5  CL 127*  --  123*  --   --   --  122*  CO2 19*  --  21*  --   --   --  15*  BUN 18  --  16  --   --   --  25*  CREATININE 0.49  --  0.48  --   --   --  0.59  GLUCOSE 203*  --  155*  --   --   --  172*  < > = values in this interval not displayed. Electrolytes  Recent Labs Lab 08/17/17 0420 08/18/17 0146  08/19/17 0605 08/20/17 0537  CALCIUM 9.6 9.2 10.5* 9.8  MG 2.3 2.2 2.3  --   PHOS 3.9 2.2* 2.8  --    CBC  Recent Labs Lab 08/18/17 0146 08/18/17 1728 08/19/17 0605  WBC 30.2* 29.6* 28.8*  HGB 7.4* 8.2* 8.0*  HCT 24.3* 26.7* 25.5*  PLT 553* 467* 403*   Coag's No results for input(s): APTT, INR in the last 168 hours. Sepsis Markers  Recent Labs Lab 08/17/17 1225 08/18/17 0146 08/19/17 0605  PROCALCITON 0.19 0.20 0.15    Glucose  Recent Labs Lab 08/14/17 1228 08/14/17 1525 08/14/17 2010 08/14/17 2353 08/15/17 0352 08/15/17 0722  GLUCAP 109* 142* 120* 108* 125* 142*   Imaging No results found.  STUDIES:  UDS 9/13:  Negative  CT HEAD W/O 9/13: IMPRESSION: 1. Extensive subarachnoid hemorrhage throughout the basilar cisterns, sylvian fissure and interhemispheric fissure question ruptured aneurysm. 2. Additional intra cerebral hematoma within the inferior LEFT frontal lobe 3.5 x 1.4 x 1.8 cm, un common to have intraparenchymal extension of subarachnoid hemorrhage, question concomitant or subsequent fall with traumatic hemorrhage? CT HEAD W/O  9/17: IMPRESSION: 1. Satisfactory extraventricular drainage catheter placement. Slight decrease in ventricular size, particularly temporal horns. Resolving parenchymal and subarachnoid blood. 2. No visible cortical infarction, but cannot exclude early ischemic change in the LEFT posterior limb internal capsule, as well as scattered areas of the LEFT frontal subcortical white matter. CSF 9/20:  RBC 14025 / Glucose 74 / Total Protein 43 / WBC 45 (neutrophils 88%, lymphocytes 8%, & monocytes 4%) CT HEAD W/O 9/27: IMPRESSION: 1. 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 Residual intraventricular hemorrhage. 2. 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 3. Mild leptomeningeal enhancement presumably from recent subarachnoid hemorrhage. CTA HEAD 9/27: IMPRESSION: 1. Severe  vasospasm of anterior greater than posterior circulation. No emergent large vessel occlusion. 2. Status post LEFT paraophthalmic aneurysm coil embolization. 3. 3 mm RIGHT paraophthalmic aneurysm. PORT CXR 9/29:  Personally reviewed by me. Right upper extremity PICC line and change to position. No focal opacity or effusion appreciated. Normal mediastinal contour and heart border.  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 Started on albumin for vasospasm by neurosurgery. 09/30 - On Neo-Synephrine at 400. More awake & intermittently following commands. Nonsensical speech.  LINES/TUBES: OETT 9/13 - 9/15 EVD 9/14 >>> RUE DL PICC 1/61 >>> L RAD ART LINE 9/28 >>> 10/1 (pt pulled) FOLEY 9/28 >>> L NGT >>> PIV  ASSESSMENT / PLAN: Day 20 post SAH. S/P opthalmic artery coiling 41 y.o. female with extensive subarachnoid hemorrhage and left intracerebral hematoma/hemorrhage. Status post ophthalmic artery aneurysm coiling. Has ongoing altered mentation and fever.    Left intracerebral hemorrhage/hematoma & extensive subarachnoid hemorrhage. Spasm by TCD's  Improving. Continues induced HTN per neurosurgery P: EVD drain per NSGY  Continue nimodipine, Keppra, albumin  SBP goal 180-200 per NSGY Frequent neuro exams Change neo to levophed for BP goals  If unable to meet goal with levophed, consider florinef    Vasospasm  P: Continue nimodipine, alpha agonists + albumin as above SBP per NSGY     Seizure on  presentation. ACD's are not prophylactic P: Seizure precautions  Keppra BID  Follow cultures as above  Trend PCT   Dysphagia  P: SLP following NPO  TF per Nutrition    Diarrhea - C-diff negative, suspect secondary to TF. Remaining vigilant for possibility of ischemia on high dose levophed. Exam currently benign P:    Hypokalemia P: Monitor, replace as indicated    Anemia - without evidence of bleeding  P: Trend CBC    Hyperglycemia  P: Monitor glucose on BMP   Prophylaxis: SCDs & Zantac twice a day. Diet: NPO, TF per Nutrition. Code Status: Full code  Disposition: Remains critically ill in the ICU. Family Update:    Penny Pia, MD [facility] Pgr: [phone] or if no answer [phone] 08/20/2017, 8:36 AM

## 2017-08-20 NOTE — Progress Notes (Signed)
Pt seen and examined. No issues overnight.   EXAM: Temp:  [98.2 F (36.8 C)-100.9 F (38.3 C)] 99 F (37.2 C) (10/04 0758) Pulse Rate:  [98-130] 108 (10/04 1000) Resp:  [15-31] 19 (10/04 1000) BP: (117-246)/(71-134) 191/127 (10/04 1000) SpO2:  [96 %-100 %] 99 % (10/04 1000) Weight:  [63 kg (138 lb 14.2 oz)] 63 kg (138 lb 14.2 oz) (10/04 0350) Intake/Output      10/03 0701 - 10/04 0700 10/04 0701 - 10/05 0700   I.V. (mL/kg) 3906.6 (62)    NG/GT 1350 150   IV Piggyback 2400    Total Intake(mL/kg) 7656.6 (121.5) 150 (2.4)   Urine (mL/kg/hr) 5975 (4) 800 (3.1)   Drains 10 0   Stool 600    Total Output 6585 800   Net +1071.6 -650        Stool Occurrence 1 x     Easily arousable Will say name Follows commands BUE/LLE No movement RLE EVD in place, 10cc x 24 hrs  LABS: Lab Results  Component Value Date   CREATININE 0.59 08/20/2017   BUN 25 (H) 08/20/2017   NA 151 (H) 08/20/2017   K 3.5 08/20/2017   CL 122 (H) 08/20/2017   CO2 15 (L) 08/20/2017   Lab Results  Component Value Date   WBC 28.8 (H) 08/19/2017   HGB 8.0 (L) 08/19/2017   HCT 25.5 (L) 08/19/2017   MCV 87.6 08/19/2017   PLT 403 (H) 08/19/2017    IMAGING: TCD velocities reviewed, essentially normal.  IMPRESSION: - 41 y.o. female SAH d# 22 s/p left ophthalmic aneurysm coiling, spasm appears to have resolved. - Appears unchanged neurologically with EVD @ 15 and no significant output.,  PLAN: - Will clamp today, may consider repeating CT in 24-48hrs and removing EVD if neurologically stable - Can stop pressors, SBP goal normal - May need PEG if not able to participate/pass SLP eval

## 2017-08-20 NOTE — Progress Notes (Signed)
  Speech Language Pathology Treatment: Dysphagia  Patient Details Name: Suzanne Brooks MRN: [id number] DOB: 1976-01-07 Today's Date: 08/20/2017 Time: 4098-1191 SLP Time Calculation (min) (ACUTE ONLY): 14 min  Assessment / Plan / Recommendation Clinical Impression  Treatment focused on facilitation of use of swallowing musculature and readiness for repeat instrumental exam. Patient lethargic, requiring max cueing including repositioning, oral care, and auditory stimulation to arouse. Once aroused however, patient able to maintain eye opening for approximately 10 minutes without additional cueing. Minimally verbal in response to basic clinician questions. Vocal quality with severely low intensity, hoarse, subtly wet at baseline. Able to consume clinician provided diagnostic pos (ice chips) with swift/appropriate oral transit of bolus and consistent initiation of pharyngeal swallow. Wet vocal quality increasing slightly in nature post intake of pos suggestive of decreased airway protection. Patient making better progress today with goals than during past treatment sessions. Will need however to be able to maintain this level of alertness consistently in order to be appropriate for repeat instrumental testing and possible initiation of a po diet. Will continue to f/u closely. Note that cognitive goals remain place and will be addressed once alertness/endurance improves.    HPI HPI: Pt is a 41 y/o female who presents with seizure activity. 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 ETT 9/13-9/15.       SLP Plan  Continue with current plan of care       Recommendations  Diet recommendations: NPO Medication Administration: Via alternative means                Oral Care Recommendations: Oral care QID Follow up Recommendations: Inpatient Rehab SLP Visit Diagnosis: Dysphagia, oropharyngeal phase (R13.12) Plan: Continue  with current plan of care       GO             [facility] MA, CCC-SLP [phone]    Suzanne Brooks 08/20/2017, 9:55 AM

## 2017-08-20 NOTE — Progress Notes (Signed)
Repeat TCDs completed per protocol. Order is discontinued. Please advise if additional studies are needed at this point.  Farrel Demark, RDMS, RVT Vascular lab [phone]

## 2017-08-20 NOTE — Progress Notes (Signed)
Physical Therapy Treatment Patient Details Name: Suzanne Brooks MRN: [id number] DOB: 08-11-1976 Today's Date: 08/20/2017    History of Present Illness Pt is a 41 y/o female who presents with seizure activity. Pt was found to have a subarachnoid hemorrhage, and is now s/p coiling of left ophthalmic aneurysm with EVD on 9/14.PMHx: HTN, Sz    PT Comments    Pt seems to be back to where we were two weeks ago (better).  Alert, following some commands, talking (although low tone), and participating in my session.  She was able to stand pivot to the recliner chair this morning.  She would continue to benefit from post acute intensive rehab and I am hopeful we are now going to progress at a faster rate.  PT will continue to follow acutely.   Follow Up Recommendations  CIR;Supervision/Assistance - 24 hour     Equipment Recommendations  Wheelchair (measurements PT);Wheelchair cushion (measurements PT)    Recommendations for Other Services Rehab consult     Precautions / Restrictions Precautions Precautions: Fall Precaution Comments: clamp EVD prior to moving the bed/mobilizing pt.  Restrictions Weight Bearing Restrictions: No    Mobility  Bed Mobility Overal bed mobility: Needs Assistance Bed Mobility: Supine to Sit Rolling: +2 for physical assistance;Max assist   Supine to sit: +2 for physical assistance;Mod assist;HOB elevated     General bed mobility comments: Two person max assist to roll for peri care (rectal pouch was leaking), two person heavy mod assist to sit up EOB.  Pt was able to start moving her left leg to the side of the bed and needed assist with right leg and trunk.  Pt attempting to help throughout and needed heavy mod assist to complete.  Less posterior pushing as well.   Transfers Overall transfer level: Needs assistance   Transfers: Sit to/from Stand;Stand Pivot Transfers Sit to Stand: +2 physical assistance;Max assist Stand pivot transfers: +2 physical  assistance;Max assist       General transfer comment: Two person max assist to stand from elevated bed and pivot to the left (strong side) assist to block right knee and move trunk and hips around to the chair.  Pt attempting pivotal steps.          Modified Rankin (Stroke Patients Only) Modified Rankin (Stroke Patients Only) Pre-Morbid Rankin Score: No symptoms Modified Rankin: Severe disability     Balance Overall balance assessment: Needs assistance Sitting-balance support: Feet supported;Bilateral upper extremity supported Sitting balance-Leahy Scale: Poor Sitting balance - Comments: Initially pt was mod assist for sitting balance EOB, however, once feet positioned on the ground and pt squared up at EOB she could be as light as min guard.  Much better sense of midline and significantly less posterior lean.    Standing balance support: Bilateral upper extremity supported Standing balance-Leahy Scale: Zero Standing balance comment: two person max assist in standing.                             Cognition Arousal/Alertness: Awake/alert Behavior During Therapy: Flat affect Overall Cognitive Status: Impaired/Different from baseline Area of Impairment: Orientation;Attention;Memory;Following commands;Safety/judgement;Awareness;Problem solving                 Orientation Level: Disoriented to;Person;Place;Time;Situation Current Attention Level: Sustained Memory: Decreased recall of precautions;Decreased short-term memory Following Commands: Follows one step commands inconsistently;Follows one step commands with increased time Safety/Judgement: Decreased awareness of deficits;Decreased awareness of safety Awareness: Intellectual Problem Solving: Slow processing;Difficulty sequencing;Decreased  initiation;Requires verbal cues;Requires tactile cues General Comments: Pt much more alert, eyes open, low tone but increase in vocolizations today.                Pertinent Vitals/Pain Pain Assessment: Faces Faces Pain Scale: Hurts even more Pain Location: with movement of right leg Pain Descriptors / Indicators: Grimacing;Discomfort Pain Intervention(s): Limited activity within patient's tolerance;Monitored during session;Repositioned           PT Goals (current goals can now be found in the care plan section) Acute Rehab PT Goals Patient Stated Goal: none stated Progress towards PT goals: Progressing toward goals    Frequency    Min 3X/week      PT Plan Current plan remains appropriate       AM-PAC PT "6 Clicks" Daily Activity  Outcome Measure  Difficulty turning over in bed (including adjusting bedclothes, sheets and blankets)?: Unable Difficulty moving from lying on back to sitting on the side of the bed? : Unable Difficulty sitting down on and standing up from a chair with arms (e.g., wheelchair, bedside commode, etc,.)?: Unable Help needed moving to and from a bed to chair (including a wheelchair)?: A Lot Help needed walking in hospital room?: Total Help needed climbing 3-5 steps with a railing? : Total 6 Click Score: 7    End of Session Equipment Utilized During Treatment: Gait belt Activity Tolerance: Patient tolerated treatment well Patient left: in chair;with call bell/phone within reach Nurse Communication: Mobility status;Need for lift equipment PT Visit Diagnosis: Muscle weakness (generalized) (M62.81);Other symptoms and signs involving the nervous system (R29.898);Other abnormalities of gait and mobility (R26.89)     Time: 1610-9604 PT Time Calculation (min) (ACUTE ONLY): 44 min  Charges:  $Therapeutic Activity: 38-52 mins   Zakery Normington B. Oliviarose Punch, PT, DPT [phone]                     08/20/2017, 10:47 PM

## 2017-08-20 NOTE — Progress Notes (Signed)
Nutrition Follow-up  INTERVENTION:   D/C Jevity 1.2  Vital 1.5 @ 55 ml/hr (1320 ml/day) Provides: 1980 kcal, 89 grams protein, and 1008 ml free water.   NUTRITION DIAGNOSIS:   Inadequate oral intake related to inability to eat as evidenced by NPO status. Ongoing.   GOAL:   Patient will meet greater than or equal to 90% of their needs Met.   MONITOR:   TF tolerance, Weight trends, Labs  ASSESSMENT:   41 year old female who presented to ED with seizures found to be hypertensive with aneurysmal SAH, ICH. Coiled in IR 9/13. Post-op to ICU on vent.   Pt discussed during ICU rounds and with RN.  Per SLP pt remains too lethargic for repeat swallow eval for now.  IVD: 10 ml out x 24 hours, may d/c soon SLP following but not ready for swallow eval yet.   Medications reviewed and include: MVI, levophed (weaning)  Labs reviewed: Na 151 (H) off 3% currently  TF: Jevity 1.2 @ 50 ml/hr  30 ml Prostat daily Provides: 1900 kcal, 90 grams protein, and 914 ml free water.   Diet Order:    NPO  Skin:   (MASD buttocks)  Last BM:  600 ml via rectal pouch x 24 hours   Height:   Ht Readings from Last 1 Encounters:  07/30/17 _0  (1.6 m)    Weight:   Wt Readings from Last 1 Encounters:  08/20/17 138 lb 14.2 oz (63 kg)    Ideal Body Weight:  52.2 kg  BMI:  Body mass index is 24.6 kg/m.  Estimated Nutritional Needs:   Kcal:  1880-2070 (30-33 kcal/kg)  Protein:  82-94 grams (1.3-1.5 grams/kg)  Fluid:  > 1.5 L/day  EDUCATION NEEDS:   No education needs identified at this time  [place], [place], [place] Pager [phone] After Hours Pager

## 2017-08-21 ENCOUNTER — Inpatient Hospital Stay (HOSPITAL_COMMUNITY): Payer: Medicaid Other

## 2017-08-21 LAB — BASIC METABOLIC PANEL
ANION GAP: 4 — AB (ref 5–15)
BUN: 23 mg/dL — ABNORMAL HIGH (ref 6–20)
CALCIUM: 9.9 mg/dL (ref 8.9–10.3)
CHLORIDE: 124 mmol/L — AB (ref 101–111)
CO2: 19 mmol/L — AB (ref 22–32)
Creatinine, Ser: 0.6 mg/dL (ref 0.44–1.00)
GFR calc non Af Amer: >60 mL/min (ref >60)
Glucose, Bld: 116 mg/dL — ABNORMAL HIGH (ref 65–99)
POTASSIUM: 3.3 mmol/L — AB (ref 3.5–5.1)
Sodium: 147 mmol/L — ABNORMAL HIGH (ref 135–145)

## 2017-08-21 LAB — CBC WITH DIFFERENTIAL/PLATELET
BASOS ABS: 0 10*3/uL (ref 0.0–0.1)
BASOS PCT: 0 %
Eosinophils Absolute: 1.3 10*3/uL — ABNORMAL HIGH (ref 0.0–0.7)
Eosinophils Relative: 7 %
HEMATOCRIT: 27.8 % — AB (ref 36.0–46.0)
HEMOGLOBIN: 8.5 g/dL — AB (ref 12.0–15.0)
Lymphocytes Relative: 11 %
Lymphs Abs: 1.9 10*3/uL (ref 0.7–4.0)
MCH: 27.2 pg (ref 26.0–34.0)
MCHC: 30.6 g/dL (ref 30.0–36.0)
MCV: 88.8 fL (ref 78.0–100.0)
Monocytes Absolute: 0.5 10*3/uL (ref 0.1–1.0)
Monocytes Relative: 3 %
NEUTROS PCT: 79 %
Neutro Abs: 14.2 10*3/uL — ABNORMAL HIGH (ref 1.7–7.7)
Platelets: 305 10*3/uL (ref 150–400)
RBC: 3.13 MIL/uL — ABNORMAL LOW (ref 3.87–5.11)
RDW: 20.2 % — AB (ref 11.5–15.5)
WBC: 17.8 10*3/uL — AB (ref 4.0–10.5)

## 2017-08-21 LAB — MAGNESIUM: Magnesium: 2.3 mg/dL (ref 1.7–2.4)

## 2017-08-21 LAB — SODIUM
SODIUM: 154 mmol/L — AB (ref 135–145)
SODIUM: 148 mmol/L — AB (ref 135–145)
Sodium: 150 mmol/L — ABNORMAL HIGH (ref 135–145)
Sodium: 150 mmol/L — ABNORMAL HIGH (ref 135–145)

## 2017-08-21 NOTE — Progress Notes (Signed)
  Speech Language Pathology Treatment: Dysphagia  Patient Details Name: Tailor Lucking MRN: [id number] DOB: 06/26/1976 Today's Date: 08/21/2017 Time: 5284-1324 SLP Time Calculation (min) (ACUTE ONLY): 17 min  Assessment / Plan / Recommendation Clinical Impression  Pt with improved alertness today since yesterday - no spontaneous verbalizations, voiced only minimally on command, but actively anticipated, masticated, and swallowed ice chip boluses with no obvious adverse reactions.  Max cueing required to elicit responses.  Recommend proceeding with MBS to determine any ability to begin POs; doubt she can sustain the attention necessary to meet needs via PO alone, but it would be beneficial if she could begin some oral alimentation. D/W RN.   HPI HPI: Pt is a 41 y/o female who presents with seizure activity. 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 ETT 9/13-9/15.  FEES 9/21 with recs for NPO.  MBS has been on hold due to poor MS.  Improving since 10/4.       SLP Plan  MBS;Continue with current plan of care       Recommendations  Diet recommendations: NPO                Oral Care Recommendations: Oral care QID Follow up Recommendations: Inpatient Rehab SLP Visit Diagnosis: Dysphagia, oropharyngeal phase (R13.12) Plan: MBS;Continue with current plan of care       GO                Blenda Mounts Laurice 08/21/2017, 10:43 AM  Marchelle Folks L. Samson Frederic, [place] CCC/SLP Pager [phone]

## 2017-08-21 NOTE — Progress Notes (Signed)
OT Treatment Note  Pt seen as cotreat with PT today. Pt appeared more lethargic today but following simple 1 step commands with increased time for processing. Appears to perseverate. Good response and moments of sustained attention to music. Continue to recommend CIR for rehab.    08/21/17 1300  OT Visit Information  Last OT Received On 08/21/17  Assistance Needed +2  PT/OT/SLP Co-Evaluation/Treatment Yes  Reason for Co-Treatment Complexity of the patient's impairments (multi-system involvement);For patient/therapist safety;To address functional/ADL transfers  OT goals addressed during session ADL's and self-care;Strengthening/ROM;Other (comment) (mobility)  History of Present Illness Pt is a 41 y/o female who presents with seizure activity. Pt was found to have a subarachnoid hemorrhage, and is now s/p coiling of left ophthalmic aneurysm with EVD on 9/14.PMHx: HTN, Sz  Precautions  Precautions Fall  Precaution Comments clamp EVD prior to moving the bed/mobilizing pt.   Required Braces or Orthoses Other Brace/Splint  Other Brace/Splint PRAFO   Pain Assessment  Pain Assessment Faces  Faces Pain Scale 6  Pain Location with pericare  Pain Descriptors / Indicators Grimacing  Pain Intervention(s) Limited activity within patient's tolerance  Cognition  Arousal/Alertness Lethargic  Behavior During Therapy Flat affect  Overall Cognitive Status Impaired/Different from baseline  Current Attention Level Sustained (periods to music)  Following Commands Follows one step commands inconsistently;Follows one step commands with increased time  Awareness Intellectual  Problem Solving Slow processing;Difficulty sequencing;Decreased initiation  General Comments appears to perseverate at times  ADL  Overall ADL's  Needs assistance/impaired  Eating/Feeding NPO  Functional mobility during ADLs Total assistance;+2 for physical assistance  General ADL Comments Pt with increased alertness to music -  turning toward sound, holding phone, then "bobboing head/shoulders to beat of music"  Bed Mobility  Overal bed mobility Needs Assistance  Bed Mobility Supine to Sit;Sit to Supine  Sit to supine Total assist;+2 for physical assistance  General bed mobility comments attemtped to facilitate movement to supine/sit with facilitating rreaching patterns with UE. Pt did appear to engage in activity  Balance  Sitting-balance support Feet supported  Sitting balance-Leahy Scale Poor  Sitting balance - Comments good midline postural control. forward head; Mod A but pt appears to attmept to pull forward and assit with trunk control  Standing balance-Leahy Scale Zero  Standing balance comment 2 person total A  Vision- Assessment  Vision Assessment? Vision impaired- to be further tested in functional context  Additional Comments perfers L gaze  Transfers  Overall transfer level Needs assistance  Transfers Sit to/from Stand  Sit to Stand Total assist;+2 physical assistance  OT Assessment/Plan  OT Plan Discharge plan remains appropriate  OT Visit Diagnosis Muscle weakness (generalized) (M62.81);Other abnormalities of gait and mobility (R26.89)  OT Frequency (ACUTE ONLY) Min 2X/week  Follow Up Recommendations CIR;Supervision/Assistance - 24 hour  OT Equipment Other (comment) (TBA)  AM-PAC OT "6 Clicks" Daily Activity Outcome Measure  Help from another person eating meals? 1  Help from another person taking care of personal grooming? 1  Help from another person toileting, which includes using toliet, bedpan, or urinal? 1  Help from another person bathing (including washing, rinsing, drying)? 1  Help from another person to put on and taking off regular upper body clothing? 1  Help from another person to put on and taking off regular lower body clothing? 1  6 Click Score 6  ADL G Code Conversion CN  OT Goal Progression  Progress towards OT goals Goals drowngraded-see care plan  Acute Rehab OT Goals  Patient Stated Goal none stated  OT Goal Formulation Patient unable to participate in goal setting  Time For Goal Achievement 09/04/17  Potential to Achieve Goals Good  OT Time Calculation  OT Start Time (ACUTE ONLY) 1227  OT Stop Time (ACUTE ONLY) 1255  OT Time Calculation (min) 28 min  OT General Charges  $OT Visit 1 Visit  OT Treatments  $Therapeutic Activity 8-22 mins  [facility], OT/L  [phone] 08/21/2017

## 2017-08-21 NOTE — Progress Notes (Signed)
Modified Barium Swallow Progress Note  Patient Details  Name: Suzanne Brooks MRN: [id number] Date of Birth: [birth year]  Today's Date: 08/21/2017  Modified Barium Swallow completed.  Full report located under Chart Review in the Imaging Section.  Brief recommendations include the following:  Clinical Impression  Pt demonstrates ongoing cognitive impairment impacting swallow function, though airway protection and strength is significantly improved. Pt is fully alert and responsive to all textures and methods of presentation with total assist. Mastication and bolus formation is slow but thorough. Pt orally held boluses and released them with mild passive transit/spillage of 5% of the bolus with min-mod pooling in the vallculae and pyriforms with resulting timely swallow trigger with the majority of the bolus. There was no observed penetration or aspiration.  Expect pts automaticity will improve with opportunities to swallow. Recommend pt initaite a dys 3 mechanical soft diet with thin liquids with full supervision. Pts arousal may continue to be inconsistent, so would allow several days of oral intake before removing her NG tube. Will follow for tolerance.     Swallow Evaluation Recommendations       SLP Diet Recommendations: Dysphagia 3 (Mech soft) solids;Thin liquid   Liquid Administration via: Cup;Straw   Medication Administration: Whole meds with puree   Supervision: Full supervision/cueing for compensatory strategies;Full assist for feeding   Compensations: Slow rate;Small sips/bites   Postural Changes: Seated upright at 90 degrees   Oral Care Recommendations: Oral care BID   Other Recommendations: Have oral suction available   Harlon Ditty, MA CCC-SLP [phone]  Suzanne Brooks, [name] 08/21/2017,1:44 PM

## 2017-08-21 NOTE — Progress Notes (Signed)
PULMONARY / CRITICAL CARE MEDICINE   Name: Suzanne Brooks MRN: [id number] DOB: 1976/02/19    ADMISSION DATE:  07/30/2017 CONSULTATION DATE:  07/30/2017  REFERRING MD:  Conchita Paris  CHIEF COMPLAINT:  SAH  HISTORY OF PRESENT ILLNESS:  41 y.o. female presented with seizures and found to have SAH and Lt frontal lobe ICH.  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 PMHx of HTN.  SUBJECTIVE:  Post bleed day 22. Levophed weaned to off. EVD open at +15. Little subjective change   VITAL SIGNS: BP (!) 152/103   Pulse (!) 110   Temp 98.5 F (36.9 C) (Oral)   Resp 16   Ht  (1.6 m)   Wt 139 lb 8.8 oz (63.3 kg)   LMP  (LMP Unknown) Comment: neg preg test 07/30/17  SpO2 100%   BMI 24.72 kg/m   INTAKE / OUTPUT: I/O last 3 completed shifts: In: 8406.7 [I.V.:4450.1; NG/GT:2456.7; IV Piggyback:1500] Out: 0865 [HQION:6295; Drains:6; Stool:1050]  PHYSICAL EXAMINATION: General:  Adult female in NAD, lying in bed HEENT: MM pink/moist, no JVD, EVD drain in place. EVD at +15 Neuro: Awake, alert, nods appropriately to questions. Still not verbal. Pupils equal, EOM's full. Not moving right as vigorously as left CV: s1s2 rrr Resp: even/non-labored, lungs bilaterally clear  MW:UXLK, non-tender, flat,no guarding or rebound bsx4 active  Extremities: warm/dry, no edema  Skin: no rashes or lesions  LABS:  BMET  Recent Labs Lab 08/19/17 0605  08/20/17 0537  08/20/17 1909 08/21/17 0059 08/21/17 0609  NA 153*  < > 151*  < > 151* 154* 148*  147*  K 3.0*  --  3.5  --   --   --  3.3*  CL 123*  --  122*  --   --   --  124*  CO2 21*  --  15*  --   --   --  19*  BUN 16  --  25*  --   --   --  23*  CREATININE 0.48  --  0.59  --   --   --  0.60  GLUCOSE 155*  --  172*  --   --   --  116*  < > = values in this interval not displayed. Electrolytes  Recent Labs Lab 08/17/17 0420 08/18/17 0146 08/19/17 0605 08/20/17 0537 08/21/17 0609  CALCIUM 9.6 9.2 10.5* 9.8 9.9  MG 2.3  2.2 2.3  --  2.3  PHOS 3.9 2.2* 2.8  --   --    CBC  Recent Labs Lab 08/18/17 1728 08/19/17 0605 08/21/17 0609  WBC 29.6* 28.8* 17.8*  HGB 8.2* 8.0* 8.5*  HCT 26.7* 25.5* 27.8*  PLT 467* 403* 305   Coag's No results for input(s): APTT, INR in the last 168 hours. Sepsis Markers  Recent Labs Lab 08/17/17 1225 08/18/17 0146 08/19/17 0605  PROCALCITON 0.19 0.20 0.15    Glucose  Recent Labs Lab 08/14/17 1228 08/14/17 1525 08/14/17 2010 08/14/17 2353 08/15/17 0352 08/15/17 0722  GLUCAP 109* 142* 120* 108* 125* 142*   Imaging No results found.  STUDIES:  UDS 9/13:  Negative  CT HEAD W/O 9/13: IMPRESSION: 1. Extensive subarachnoid hemorrhage throughout the basilar cisterns, sylvian fissure and interhemispheric fissure question ruptured aneurysm. 2. Additional intra cerebral hematoma within the inferior LEFT frontal lobe 3.5 x 1.4 x 1.8 cm, un common to have intraparenchymal extension of subarachnoid hemorrhage, question concomitant or subsequent fall with traumatic hemorrhage? CT HEAD W/O 9/17: IMPRESSION: 1. Satisfactory extraventricular  drainage catheter placement. Slight decrease in ventricular size, particularly temporal horns. Resolving parenchymal and subarachnoid blood. 2. No visible cortical infarction, but cannot exclude early ischemic change in the LEFT posterior limb internal capsule, as well as scattered areas of the LEFT frontal subcortical white matter. CSF 9/20:  RBC 14025 / Glucose 74 / Total Protein 43 / WBC 45 (neutrophils 88%, lymphocytes 8%, & monocytes 4%) CT HEAD W/O 9/27: IMPRESSION: 1. 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 Residual intraventricular hemorrhage. 2. 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 3. Mild leptomeningeal enhancement presumably from recent subarachnoid hemorrhage. CTA HEAD 9/27: IMPRESSION: 1. Severe vasospasm of anterior greater than posterior circulation. No  emergent large vessel occlusion. 2. Status post LEFT paraophthalmic aneurysm coil embolization. 3. 3 mm RIGHT paraophthalmic aneurysm. PORT CXR 9/29:  Personally reviewed by me. Right upper extremity PICC line and change to position. No focal opacity or effusion appreciated. Normal mediastinal contour and heart border.  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 Started on albumin for vasospasm by neurosurgery. 09/30 - On Neo-Synephrine at 400. More awake & intermittently following commands. Nonsensical speech. 10/5 off alpha agents  LINES/TUBES: OETT 9/13 - 9/15 EVD 9/14 >>> RUE DL PICC 1/61 >>> L RAD ART LINE 9/28 >>> 10/1 (pt pulled) FOLEY 9/28 >>> L NGT >>> PIV  ASSESSMENT / PLAN: Day 20 post SAH. S/P opthalmic artery coiling 41 y.o. female with extensive subarachnoid hemorrhage and left intracerebral hematoma/hemorrhage. Status post ophthalmic artery aneurysm coiling. 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  P: EVD drain per NSGY  Continue nimodipine, Keppra, albumin   Frequent neuro exams  Vasospasm  resolved     Seizure on presentation. ACD's are not prophylactic P: Seizure precautions  Keppra BID  Follow cultures as above  Trend PCT   Dysphagia  P: SLP following NPO  TF per Nutrition    Diarrhea - C-diff negative, suspect secondary to TF. Exam remains  benign P:    Hypokalemia P: Monitor, replace as indicated    Anemia - without evidence of bleeding  P: Trend CBC    Hyperglycemia  P: Monitor glucose on BMP   Prophylaxis: SCDs & Zantac twice a day. Diet: NPO, TF per Nutrition. Code Status: Full code  Disposition: Remains critically ill in the ICU.   Penny Pia, MD [facility] Pgr: [phone] or if no answer [phone] 08/21/2017, 9:49 AM

## 2017-08-21 NOTE — Progress Notes (Signed)
08/21/17 1309  PT Visit Information  Last PT Received On 08/21/17  Assistance Needed +2  PT/OT/SLP Co-Evaluation/Treatment Yes  Reason for Co-Treatment Complexity of the patient's impairments (multi-system involvement);Necessary to address cognition/behavior during functional activity;For patient/therapist safety;To address functional/ADL transfers  PT goals addressed during session Mobility/safety with mobility;Balance;Strengthening/ROM  History of Present Illness Pt is a 41 y/o female who presents with seizure activity. Pt was found to have a subarachnoid hemorrhage, and is now s/p coiling of left ophthalmic aneurysm with EVD on 9/14.PMHx: HTN, Sz  Subjective Data  Subjective Pt singing something as we started our treatment.   Patient Stated Goal none stated  Precautions  Precautions Fall  Precaution Comments clamp EVD prior to moving the bed/mobilizing pt.   Other Brace/Splint bil mittens  Pain Assessment  Pain Assessment Faces  Faces Pain Scale 0  Cognition  Arousal/Alertness Lethargic  Behavior During Therapy Flat affect  Overall Cognitive Status Impaired/Different from baseline  Area of Impairment Orientation;Attention;Memory;Following commands;Safety/judgement;Awareness;Problem solving  Orientation Level Disoriented to;Person;Place;Time;Situation  Current Attention Level Sustained (to music )  Following Commands Follows one step commands inconsistently;Follows one step commands with increased time  Safety/Judgement Decreased awareness of safety;Decreased awareness of deficits  Awareness Intellectual  Problem Solving Slow processing;Decreased initiation;Difficulty sequencing;Requires tactile cues;Requires verbal cues  General Comments Pt needed frequent verbal and tactile stimulation for arousal today even EOB.  Seemed to respond positively to familiar music with a beat, needed help initiating and sequencing movement.   Bed Mobility  Overal bed mobility Needs Assistance  Bed  Mobility Supine to Sit;Rolling  Rolling +2 for physical assistance;Total assist  Supine to sit +2 for safety/equipment;Total assist  Sit to supine +2 for physical assistance;Total assist  General bed mobility comments Two person assist to facilitate reaching and turning trunk to roll for peri care, assist needed to support trunk and move legs to EOB and back into bed.  Pt reaching to come up to sitting, but not assisting to come back to supine.   Transfers  Overall transfer level Needs assistance  Equipment used None  Transfers Sit to/from Stand  Sit to Stand +2 physical assistance;Total assist  General transfer comment Two person total assist today to stand EOB.  Right leg crumbling under weight and had to be blocked and stabilized against bed with facilitation at ischium for hip extension.  assist at trunk as well as pt stays flexed at trunk and cervical spine.   Modified Rankin (Stroke Patients Only)  Pre-Morbid Rankin Score 0  Modified Rankin 5  Balance  Overall balance assessment Needs assistance  Sitting-balance support Feet supported;Bilateral upper extremity supported;Single extremity supported;No upper extremity supported  Sitting balance-Leahy Scale Poor  Sitting balance - Comments fluctuating assist level as pt initially max to total assist, but did seem to activate some trunk in sitting mod assist.  We worked on attention/arousal and pt responded positively with head nods and shoulder movment to music.    Standing balance support No upper extremity supported  Standing balance-Leahy Scale Zero  Standing balance comment 2 person total assist.   PT - End of Session  Equipment Utilized During Treatment Gait belt  Activity Tolerance Patient limited by fatigue  Patient left in bed;with call bell/phone within reach;with nursing/sitter in room  Nurse Communication Mobility status  PT - Assessment/Plan  PT Plan Current plan remains appropriate  PT Visit Diagnosis Muscle weakness  (generalized) (M62.81);Other symptoms and signs involving the nervous system (R29.898);Other abnormalities of gait and mobility (R26.89)  PT Frequency (ACUTE  ONLY) Min 3X/week  Recommendations for Other Services Rehab consult  Follow Up Recommendations CIR;Supervision/Assistance - 24 hour  PT equipment Wheelchair (measurements PT);Wheelchair cushion (measurements PT)  AM-PAC PT "6 Clicks" Daily Activity Outcome Measure  Difficulty turning over in bed (including adjusting bedclothes, sheets and blankets)? 1  Difficulty moving from lying on back to sitting on the side of the bed?  1  Difficulty sitting down on and standing up from a chair with arms (e.g., wheelchair, bedside commode, etc,.)? 1  Help needed moving to and from a bed to chair (including a wheelchair)? 1  Help needed walking in hospital room? 1  Help needed climbing 3-5 steps with a railing?  1  6 Click Score 6  Mobility G Code  CN  PT Goal Progression  Progress towards PT goals Progressing toward goals  PT Time Calculation  PT Start Time (ACUTE ONLY) 1225  PT Stop Time (ACUTE ONLY) 1256  PT Time Calculation (min) (ACUTE ONLY) 31 min  PT General Charges  $$ ACUTE PT VISIT 1 Visit  PT Treatments  $Therapeutic Activity 8-22 mins  08/21/2017 Pt a bit more lethargic today compared to yesterday.  Opens eyes, but needs quite a bit of stimulation to maintain arousal and attention.  She responded well to familiar/mainstream music with a beat.  Pt. Continues to participate some and move 3/4 extremities (right arm seemingly weaker than left arm and right leg flaccid).  PT will continue to follow acutely.  Rollene Rotunda Chesley Valls, PT, DPT [phone]

## 2017-08-21 NOTE — Plan of Care (Signed)
Problem: Health Behavior/Discharge Planning: Goal: Ability to manage health-related needs will improve Outcome: Progressing actively anticipated, masticated, and swallowed ice chip boluses with no obvious adverse reactions per SLP  Problem: Nutrition: Goal: Risk of aspiration will decrease Outcome: Progressing Pt tolerated ice chips 08/21/2017

## 2017-08-22 LAB — BASIC METABOLIC PANEL
Anion gap: 8 (ref 5–15)
BUN: 18 mg/dL (ref 6–20)
CALCIUM: 9.7 mg/dL (ref 8.9–10.3)
CO2: 21 mmol/L — AB (ref 22–32)
CREATININE: 0.5 mg/dL (ref 0.44–1.00)
Chloride: 121 mmol/L — ABNORMAL HIGH (ref 101–111)
GFR calc non Af Amer: >60 mL/min (ref >60)
GLUCOSE: 113 mg/dL — AB (ref 65–99)
Potassium: 3.2 mmol/L — ABNORMAL LOW (ref 3.5–5.1)
Sodium: 150 mmol/L — ABNORMAL HIGH (ref 135–145)

## 2017-08-22 LAB — SODIUM
Sodium: 151 mmol/L — ABNORMAL HIGH (ref 135–145)
Sodium: 151 mmol/L — ABNORMAL HIGH (ref 135–145)
Sodium: 148 mmol/L — ABNORMAL HIGH (ref 135–145)
Sodium: 149 mmol/L — ABNORMAL HIGH (ref 135–145)

## 2017-08-22 LAB — MAGNESIUM: Magnesium: 2.2 mg/dL (ref 1.7–2.4)

## 2017-08-22 LAB — PHOSPHORUS: PHOSPHORUS: 3.9 mg/dL (ref 2.5–4.6)

## 2017-08-22 MED ORDER — NIMODIPINE 60 MG/20ML PO SOLN
60.0000 mg | ORAL | Status: DC
  Administered 2017-08-22: 20:00:00 60 mg
  Filled 2017-08-22: qty 20

## 2017-08-22 MED ORDER — NIMODIPINE 30 MG PO CAPS
30.0000 mg | ORAL_CAPSULE | ORAL | Status: DC
  Administered 2017-08-22: 30 mg via ORAL

## 2017-08-22 MED ORDER — NIMODIPINE 30 MG PO CAPS: 60.0000 mg | ORAL_CAPSULE | ORAL | Status: DC

## 2017-08-22 MED ORDER — NYSTATIN 100000 UNIT/ML MT SUSP
5.0000 mL | Freq: Four times a day (QID) | OROMUCOSAL | Status: DC
  Administered 2017-08-22 – 2017-09-01 (×37): 500000 [IU] via ORAL
  Filled 2017-08-22 (×39): qty 5

## 2017-08-22 MED ORDER — NIMODIPINE 60 MG/20ML PO SOLN
60.0000 mg | ORAL | Status: DC
  Administered 2017-08-23 – 2017-08-24 (×10): 60 mg
  Filled 2017-08-22 (×10): qty 20

## 2017-08-22 MED ORDER — NIMODIPINE 60 MG/20ML PO SOLN
30.0000 mg | ORAL | Status: DC
  Administered 2017-08-22: 16:00:00 30 mg
  Filled 2017-08-22 (×2): qty 20

## 2017-08-22 MED ORDER — CALAMINE EX LOTN
TOPICAL_LOTION | CUTANEOUS | Status: DC | PRN
  Administered 2017-08-24: 21:00:00 via TOPICAL
  Administered 2017-08-25: 1 via TOPICAL
  Filled 2017-08-22: qty 177

## 2017-08-22 NOTE — Progress Notes (Signed)
  Speech Language Pathology Treatment: Dysphagia  Patient Details Name: Suzanne Brooks MRN: [id number] DOB: 05-07-1976 Today's Date: 08/22/2017 Time: 4098-1191 SLP Time Calculation (min) (ACUTE ONLY): 28 min  Assessment / Plan / Recommendation Clinical Impression  SLP followed up for diet tolerance. Note diet initiation following completion of MBS yesterday. Pts swallow function continues to be impacted by her fluctuating mental status. SLP observed pt with dysphagia 3 (mechanical soft) and thin liquid breakfast meal with pts mother present during session. Pt alert however exhibited fatigue easily with increased cueing to maintain alertness. Pt exhibited prolonged mastication of mechanical soft PO with mild to moderate oral residuals, intermittent reduced labial seal with thin liquids via cup sip, and delayed oral transit across consistencies. No overt s/sx of aspiration with any consistencies however pt required increased time and increased fatigue with mech soft consistencies, limiting amount of intake. Recommend diet downgrade to dysphagia 2 (fine chopped) and thin liquids to maximize intake across a meal with hopes of pt being able to advance to adequate oral intake by mouth without needs for supplemental alternative nutrition. SLP to continue to monitor.    HPI HPI: Pt is a 41 y/o female who presents with seizure activity. 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 ETT 9/13-9/15.  FEES 9/21 with recs for NPO.  MBS has been on hold due to poor MS.  Improving since 10/4.       SLP Plan  Continue with current plan of care       Recommendations  Diet recommendations: Dysphagia 2 (fine chop);Thin liquid Liquids provided via: Cup Medication Administration: Crushed with puree Supervision: Full supervision/cueing for compensatory strategies;Trained caregiver to feed patient Compensations: Slow rate;Small  sips/bites;Minimize environmental distractions;Monitor for anterior loss;Follow solids with liquid                Oral Care Recommendations: Oral care BID Follow up Recommendations: Inpatient Rehab SLP Visit Diagnosis: Dysphagia, oropharyngeal phase (R13.12) Plan: Continue with current plan of care       GO               Marcene Duos MA, CCC-SLP Acute Care Speech Language Pathologist    Kennieth Rad 08/22/2017, 10:20 AM

## 2017-08-22 NOTE — Progress Notes (Signed)
Pt seen and examined. No issues overnight. EVD clamped.  EXAM: Temp:  [98.3 F (36.8 C)-100.3 F (37.9 C)] 100.3 F (37.9 C) (10/06 0400) Pulse Rate:  [38-110] 38 (10/06 0700) Resp:  [14-24] 24 (10/06 0700) BP: (120-177)/(84-126) 146/118 (10/06 0700) SpO2:  [97 %-100 %] 97 % (10/06 0700) Weight:  [62 kg (136 lb 11 oz)] 62 kg (136 lb 11 oz) (10/06 0600) Intake/Output      10/05 0701 - 10/06 0700 10/06 0701 - 10/07 0700   I.V. (mL/kg) 2520 (40.6)    NG/GT 1640    Total Intake(mL/kg) 4160 (67.1)    Urine (mL/kg/hr) 1725 (1.2)    Stool 500    Total Output 2225     Net +1935          Urine Occurrence 4 x     Awake and alert Follows commands throughout Quadriparesis stable  Stable Keep EVD clamped Scan in the morning; hopefully remove EVD tomorrow

## 2017-08-23 ENCOUNTER — Inpatient Hospital Stay (HOSPITAL_COMMUNITY): Payer: Medicaid Other

## 2017-08-23 LAB — SODIUM
SODIUM: 148 mmol/L — AB (ref 135–145)
SODIUM: 148 mmol/L — AB (ref 135–145)
SODIUM: 145 mmol/L (ref 135–145)

## 2017-08-23 LAB — CULTURE, BLOOD (ROUTINE X 2)
Culture: NO GROWTH
Special Requests: ADEQUATE

## 2017-08-23 NOTE — Progress Notes (Signed)
[employer] came unit. Instructions given to call [facility] Department when patient is released from hospital.

## 2017-08-23 NOTE — Progress Notes (Signed)
Pt seen and examined. No issues overnight.  EXAM: Temp:  [98 F (36.7 C)-99.1 F (37.3 C)] 98.3 F (36.8 C) (10/07 0756) Pulse Rate:  [90-121] 102 (10/07 0800) Resp:  [14-22] 16 (10/07 0800) BP: (129-178)/(88-131) 148/104 (10/07 0800) SpO2:  [99 %-100 %] 100 % (10/07 0800) Weight:  [62.8 kg (138 lb 7.2 oz)] 62.8 kg (138 lb 7.2 oz) (10/07 0500) Intake/Output      10/06 0701 - 10/07 0700 10/07 0701 - 10/08 0700   I.V. (mL/kg) 2263.3 (36) 100 (1.6)   NG/GT 1375 125   Total Intake(mL/kg) 3638.3 (57.9) 225 (3.6)   Urine (mL/kg/hr) 1775 (1.2)    Stool 575    Total Output 2350     Net +1288.3 +225        Urine Occurrence 5 x     Arousable PERRL More somnolent than yesterday Barely follows commands  CT Head shows ventriculomegaly  I think she is symptomatic from hydrocephalus I have opened her drain at 10 cm H2O.  It wasn't functioning so I flushed it and it is now working. Continue drainage for now

## 2017-08-24 DIAGNOSIS — Z978 Presence of other specified devices: Secondary | ICD-10-CM

## 2017-08-24 LAB — BASIC METABOLIC PANEL
Anion gap: 11 (ref 5–15)
BUN: 19 mg/dL (ref 6–20)
CHLORIDE: 114 mmol/L — AB (ref 101–111)
CO2: 21 mmol/L — ABNORMAL LOW (ref 22–32)
CREATININE: 0.67 mg/dL (ref 0.44–1.00)
Calcium: 9.9 mg/dL (ref 8.9–10.3)
GFR calc Af Amer: >60 mL/min (ref >60)
GLUCOSE: 104 mg/dL — AB (ref 65–99)
POTASSIUM: 3.7 mmol/L (ref 3.5–5.1)
Sodium: 146 mmol/L — ABNORMAL HIGH (ref 135–145)

## 2017-08-24 LAB — CBC WITH DIFFERENTIAL/PLATELET
Basophils Absolute: 0 10*3/uL (ref 0.0–0.1)
Basophils Relative: 0 %
EOS PCT: 9 %
Eosinophils Absolute: 0.9 10*3/uL — ABNORMAL HIGH (ref 0.0–0.7)
HEMATOCRIT: 32 % — AB (ref 36.0–46.0)
Hemoglobin: 9.8 g/dL — ABNORMAL LOW (ref 12.0–15.0)
LYMPHS ABS: 1.3 10*3/uL (ref 0.7–4.0)
Lymphocytes Relative: 13 %
MCH: 27.1 pg (ref 26.0–34.0)
MCHC: 30.6 g/dL (ref 30.0–36.0)
MCV: 88.6 fL (ref 78.0–100.0)
MONOS PCT: 7 %
Monocytes Absolute: 0.7 10*3/uL (ref 0.1–1.0)
NEUTROS ABS: 6.9 10*3/uL (ref 1.7–7.7)
Neutrophils Relative %: 71 %
Platelets: ADEQUATE 10*3/uL (ref 150–400)
RBC: 3.61 MIL/uL — ABNORMAL LOW (ref 3.87–5.11)
RDW: 20.7 % — AB (ref 11.5–15.5)
WBC: 9.8 10*3/uL (ref 4.0–10.5)

## 2017-08-24 LAB — SODIUM
SODIUM: 147 mmol/L — AB (ref 135–145)
Sodium: 146 mmol/L — ABNORMAL HIGH (ref 135–145)

## 2017-08-24 NOTE — Progress Notes (Signed)
Occupational Therapy Treatment Patient Details Name: Suzanne Brooks MRN: [id number] DOB: [birth year] Today's Date: 08/24/2017    History of present illness Pt is a 41 y/o female who presents with seizure activity. Pt was found to have a subarachnoid hemorrhage, and is now s/p coiling of left ophthalmic aneurysm with EVD on 9/14.PMHx: HTN, Sz   OT comments  Pt seen in conjunction with PT to maximize functional participation. Pt lethargic this session but was able to tolerate sitting at EOB with total assistance to engage in ADL tasks this session for approximately 10 minutes. She was able to maintain visual attention to music video playing on cell phone and attempt to support when phone placed in her hand. She was able to track to midline but maintains L gaze preference. HR up to 132 bpm with EOB activity. Will continue to follow while admitted.    Follow Up Recommendations  CIR;Supervision/Assistance - 24 hour    Equipment Recommendations  Other (comment)    Recommendations for Other Services      Precautions / Restrictions Precautions Precautions: Fall Precaution Comments: clamp EVD prior to moving the bed/mobilizing pt.  Required Braces or Orthoses: Other Brace/Splint Other Brace/Splint: bil mittens; wrist restraints this session but not tied Restrictions Weight Bearing Restrictions: No       Mobility Bed Mobility Overal bed mobility: Needs Assistance Bed Mobility: Supine to Sit;Sit to Supine     Supine to sit: +2 for safety/equipment;Total assist Sit to supine: +2 for physical assistance;Total assist   General bed mobility comments: Pt did not initiate this session.   Transfers                 General transfer comment: did not attempt this session    Balance Overall balance assessment: Needs assistance Sitting-balance support: Feet supported;Bilateral upper extremity supported;Single extremity supported;No upper extremity supported Sitting balance-Leahy Scale:  Poor Sitting balance - Comments: total assist seated at EOB this session Postural control: Posterior lean;Left lateral lean                                 ADL either performed or assessed with clinical judgement   ADL Overall ADL's : Needs assistance/impaired                                       General ADL Comments: Total assistance this session.      Vision   Additional Comments: Pt with L gaze preference and able to track phone to midline but not past.    Perception     Praxis      Cognition Arousal/Alertness: Lethargic Behavior During Therapy: Flat affect Overall Cognitive Status: Impaired/Different from baseline Area of Impairment: Orientation;Attention;Memory;Following commands;Safety/judgement;Awareness;Problem solving                 Orientation Level: Disoriented to;Person;Place;Time;Situation Current Attention Level: Focused Memory: Decreased recall of precautions;Decreased short-term memory Following Commands:  (did not follow commands this session) Safety/Judgement: Decreased awareness of safety;Decreased awareness of deficits Awareness: Intellectual Problem Solving: Slow processing;Decreased initiation;Difficulty sequencing;Requires tactile cues;Requires verbal cues General Comments: Pt with minimal interaction this session. Did not follow commands this session. Was able to visually attend to phone with music video playing.         Exercises Other Exercises Other Exercises: Noted spasticity in R UE with rapid movement this session that  has not been present previously.    Shoulder Instructions       General Comments Pt very lethargic this session opening her eyes but with decreased interaction this session.     Pertinent Vitals/ Pain       Pain Assessment: Faces Faces Pain Scale: No hurt  Home Living                                          Prior Functioning/Environment               Frequency  Min 2X/week        Progress Toward Goals  OT Goals(current goals can now be found in the care plan section)     Acute Rehab OT Goals Patient Stated Goal: none stated OT Goal Formulation: Patient unable to participate in goal setting Time For Goal Achievement: 09/04/17 Potential to Achieve Goals: Good  Plan Discharge plan remains appropriate    Co-evaluation    PT/OT/SLP Co-Evaluation/Treatment: Yes Reason for Co-Treatment: Complexity of the patient's impairments (multi-system involvement);Necessary to address cognition/behavior during functional activity;For patient/therapist safety;To address functional/ADL transfers   OT goals addressed during session: ADL's and self-care      AM-PAC PT "6 Clicks" Daily Activity     Outcome Measure   Help from another person eating meals?: Total Help from another person taking care of personal grooming?: Total Help from another person toileting, which includes using toliet, bedpan, or urinal?: Total Help from another person bathing (including washing, rinsing, drying)?: Total Help from another person to put on and taking off regular upper body clothing?: Total Help from another person to put on and taking off regular lower body clothing?: Total 6 Click Score: 6    End of Session    OT Visit Diagnosis: Muscle weakness (generalized) (M62.81);Other abnormalities of gait and mobility (R26.89);Other symptoms and signs involving cognitive function   Activity Tolerance Patient limited by lethargy   Patient Left in bed;with call bell/phone within reach   Nurse Communication Mobility status (request to clamp drain)        Time: 1610-9604 OT Time Calculation (min): 30 min  Charges: OT General Charges $OT Visit: 1 Visit OT Treatments $Self Care/Home Management : 8-22 mins  Doristine Section, MS OTR/L  Pager: [phone]    Aldo Sondgeroth A Dallis Darden 08/24/2017, 2:02 PM

## 2017-08-24 NOTE — Progress Notes (Signed)
PULMONARY / CRITICAL CARE MEDICINE   Name: Suzanne Brooks MRN: [id number] DOB: 1975/12/03    ADMISSION DATE:  07/30/2017 CONSULTATION DATE:  07/30/2017  REFERRING MD:  Conchita Paris  CHIEF COMPLAINT:  SAH  HISTORY OF PRESENT ILLNESS:  41 y.o. female presented with seizures and found to have SAH and Lt frontal lobe ICH.  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 PMHx of HTN.  SUBJECTIVE:  Off pressors  VITAL SIGNS: BP (!) 142/92   Pulse (!) 105   Temp 99.4 F (37.4 C) (Oral)   Resp 18   Ht  (1.6 m)   Wt 61.8 kg (136 lb 3.9 oz)   LMP  (LMP Unknown) Comment: neg preg test 07/30/17  SpO2 100%   BMI 24.13 kg/m   INTAKE / OUTPUT: I/O last 3 completed shifts: In: 5893.3 [P.O.:150; I.V.:3473.3; NG/GT:2270] Out: 4011 [Urine:2775; Drains:136; Stool:1100]  PHYSICAL EXAMINATION: General: awakens Neuro: int fc and talk, moves ext, perr HEENT: jvd wnl PULM: CTA uppers CV: s1 s2 RRR GI: soft, bs wnl, no r Extremities: edema min    LABS:  BMET  Recent Labs Lab 08/20/17 0537  08/21/17 0609  08/22/17 0630  08/23/17 2001 08/24/17 0132 08/24/17 0643  NA 151*  < > 148*  147*  < > 150*  < > 145 146* 147*  K 3.5  --  3.3*  --  3.2*  --   --   --   --   CL 122*  --  124*  --  121*  --   --   --   --   CO2 15*  --  19*  --  21*  --   --   --   --   BUN 25*  --  23*  --  18  --   --   --   --   CREATININE 0.59  --  0.60  --  0.50  --   --   --   --   GLUCOSE 172*  --  116*  --  113*  --   --   --   --   < > = values in this interval not displayed. Electrolytes  Recent Labs Lab 08/18/17 0146 08/19/17 0605 08/20/17 0537 08/21/17 0609 08/22/17 0630  CALCIUM 9.2 10.5* 9.8 9.9 9.7  MG 2.2 2.3  --  2.3 2.2  PHOS 2.2* 2.8  --   --  3.9   CBC  Recent Labs Lab 08/18/17 1728 08/19/17 0605 08/21/17 0609  WBC 29.6* 28.8* 17.8*  HGB 8.2* 8.0* 8.5*  HCT 26.7* 25.5* 27.8*  PLT 467* 403* 305   Coag's No results for input(s): APTT, INR in the last 168  hours. Sepsis Markers  Recent Labs Lab 08/17/17 1225 08/18/17 0146 08/19/17 0605  PROCALCITON 0.19 0.20 0.15    Glucose No results for input(s): GLUCAP in the last 168 hours. Imaging No results found.  STUDIES:  UDS 9/13:  Negative  CT HEAD W/O 9/13: IMPRESSION: 1. Extensive subarachnoid hemorrhage throughout the basilar cisterns, sylvian fissure and interhemispheric fissure question ruptured aneurysm. 2. Additional intra cerebral hematoma within the inferior LEFT frontal lobe 3.5 x 1.4 x 1.8 cm, un common to have intraparenchymal extension of subarachnoid hemorrhage, question concomitant or subsequent fall with traumatic hemorrhage? CT HEAD W/O 9/17: IMPRESSION: 1. Satisfactory extraventricular drainage catheter placement. Slight decrease in ventricular size, particularly temporal horns. Resolving parenchymal and subarachnoid blood. 2. No visible cortical infarction, but cannot exclude early ischemic change  in the LEFT posterior limb internal capsule, as well as scattered areas of the LEFT frontal subcortical white matter. CSF 9/20:  RBC 14025 / Glucose 74 / Total Protein 43 / WBC 45 (neutrophils 88%, lymphocytes 8%, & monocytes 4%) CT HEAD W/O 9/27: IMPRESSION: 1. 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 Residual intraventricular hemorrhage. 2. 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 3. Mild leptomeningeal enhancement presumably from recent subarachnoid hemorrhage. CTA HEAD 9/27: IMPRESSION: 1. Severe vasospasm of anterior greater than posterior circulation. No emergent large vessel occlusion. 2. Status post LEFT paraophthalmic aneurysm coil embolization. 3. 3 mm RIGHT paraophthalmic aneurysm. PORT CXR 9/29:  Personally reviewed by me. Right upper extremity PICC line and change to position. No focal opacity or effusion appreciated. Normal mediastinal contour and heart border.  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 Started on albumin for vasospasm by neurosurgery. 09/30 - On Neo-Synephrine at 400. More awake & intermittently following commands. Nonsensical speech. 10/5 off alpha agents  LINES/TUBES: OETT 9/13 - 9/15 EVD 9/14 >>> RUE DL PICC 7/42 >>> L RAD ART LINE 9/28 >>> 10/1 (pt pulled) FOLEY 9/28 >>> L NGT >>> PIV  ASSESSMENT / PLAN: Day 20 post SAH. S/P opthalmic artery coiling 41 y.o. female with extensive subarachnoid hemorrhage and left intracerebral hematoma/hemorrhage. Status post ophthalmic artery aneurysm coiling. 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  Vasospasm resolved Hydro worsening with clamping drain P: EVD drain per NSGY  consider shunt placement Continue nimodipine ( may extend past with last vasospasm) Keppra maintain as she had seizures on admission Dc albumin Frequent neuro exams I reviewed CT head consider slight reduction in intake  Dysphagia  P: SLP following TF per Nutrition  Follow intake by mouth  Diarrhea - C-diff negative P: Imodium, may need schedule this and NOT pn Ensure vital Obtain Bmet with stools loose  Hypokalemia P: Monitor bmet now  Leukocytosis, high risk drain infection with duration - if fever, sample drain csf Repeat cbc with diff  Anemia - without evidence of bleeding  P: -Trend CBC  today   Hyperglycemia  P: Monitor glucose on  BMP  Mcarthur Rossetti. Tyson Alias, MD, FACP Pgr: [phone] [facility]

## 2017-08-24 NOTE — Progress Notes (Signed)
  Speech Language Pathology Treatment: Dysphagia;Cognitive-Linquistic  Patient Details Name: Suzanne Brooks MRN: [id number] DOB: [birth year] Today's Date: 08/24/2017 Time: 4098-1191 SLP Time Calculation (min) (ACUTE ONLY): 18 min  Assessment / Plan / Recommendation Clinical Impression  Utilized pm meal to address both dysphagia and cognitive-linguistic goals. Patient able to sustain attention for approximately 15 minutes to basic function ADL (self feeding) with max verbal, visual, and tactile cueing including use of HOH assist for self feeding to promote improved attention to task and swallowing readiness. Patient non-verbal today despite max questioning cues and encouragement to participate in conversation, answer basic questions. Swallowing function remains appropriate for current diet with prolonged but function mastication of chopped solids, full clearance of oral cavity, and good airway protection across consistencies. Cough post swallow noted with first po trial (sip of thin liquid) only with remainder of boluses swallowed without s/s of aspiration. Overall, appears to be tolerating current diet. Noted temperature spike today. Will f/u closely for continued diet tolerance and therapeutic intervention.    HPI HPI: Pt is a 41 y/o female who presents with seizure activity. 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 ETT 9/13-9/15.  FEES 9/21 with recs for NPO.  MBS has been on hold due to poor MS.  Improving since 10/4.       SLP Plan  Continue with current plan of care       Recommendations  Diet recommendations: Dysphagia 2 (fine chop);Thin liquid Liquids provided via: Cup;Straw Medication Administration: Whole meds with puree Supervision: Staff to assist with self feeding;Full supervision/cueing for compensatory strategies Compensations: Slow rate;Small sips/bites;Minimize environmental distractions;Monitor for  anterior loss;Follow solids with liquid Postural Changes and/or Swallow Maneuvers: Seated upright 90 degrees                Oral Care Recommendations: Oral care BID Follow up Recommendations: Inpatient Rehab SLP Visit Diagnosis: Dysphagia, oropharyngeal phase (R13.12) Plan: Continue with current plan of care       [facility] MA, CCC-SLP [phone]    Suzanne Brooks [name] 08/24/2017, 3:19 PM

## 2017-08-24 NOTE — Progress Notes (Signed)
Physical Therapy Treatment Patient Details Name: Suzanne Brooks MRN: [id number] DOB: Dec 15, 1975 Today's Date: 08/24/2017    History of Present Illness Pt is a 41 y/o female who presents with seizure activity. Pt was found to have a subarachnoid hemorrhage, and is now s/p coiling of left ophthalmic aneurysm with EVD on 9/14.PMHx: HTN, Sz    PT Comments    Pt lethargic, warm to the touch today.  Opens eyes, but was not really following any other commands.  Not moving much spontaneously in any of her extremities today.  Positioned back in bed, RN made aware.  Follow Up Recommendations  CIR;Supervision/Assistance - 24 hour     Equipment Recommendations  Wheelchair (measurements PT);Wheelchair cushion (measurements PT)    Recommendations for Other Services Rehab consult     Precautions / Restrictions Precautions Precautions: Fall Precaution Comments: clamp EVD prior to moving the bed/mobilizing pt.  Required Braces or Orthoses: Other Brace/Splint Other Brace/Splint: bil mittens; wrist restraints this session but not tied Restrictions Weight Bearing Restrictions: No    Mobility  Bed Mobility Overal bed mobility: Needs Assistance Bed Mobility: Supine to Sit;Sit to Supine     Supine to sit: +2 for physical assistance;Total assist;HOB elevated Sit to supine: +2 for physical assistance;Total assist   General bed mobility comments: Two person total assist to get EOB.  No attempts to assist at trunk, arms or legs.    Transfers                 General transfer comment: Pt was warm, lethargic, and generally worse than last session, so we decided not to lift her to the chair today.  VSS throughout, but pt just did not seem well.       Modified Rankin (Stroke Patients Only) Modified Rankin (Stroke Patients Only) Pre-Morbid Rankin Score: No symptoms Modified Rankin: Severe disability     Balance Overall balance assessment: Needs assistance Sitting-balance support: Feet  supported;No upper extremity supported;Bilateral upper extremity supported;Single extremity supported Sitting balance-Leahy Scale: Zero Sitting balance - Comments: total assist EOB.  Assist even needed to support upright cervical spine, both hands positioned to help prop, but not really assisting today.  Postural control: Posterior lean     Standing balance comment: Did not attempt due to lack of help in sitting EOB.                             Cognition Arousal/Alertness: Lethargic Behavior During Therapy: Flat affect Overall Cognitive Status: Impaired/Different from baseline Area of Impairment: Attention;Following commands                 Orientation Level: Disoriented to;Person;Place;Time;Situation Current Attention Level: Focused Memory: Decreased recall of precautions;Decreased short-term memory Following Commands: Follows one step commands inconsistently Safety/Judgement: Decreased awareness of safety;Decreased awareness of deficits Awareness: Intellectual Problem Solving: Slow processing;Decreased initiation;Difficulty sequencing;Requires tactile cues;Requires verbal cues General Comments: Pt only followed command to open eyes and attempt to look at me.  Seemingly more lethargic, attempted to play music which she responded well to last session with no real response (other than visual tracking to the left to look at phone.        Exercises Other Exercises Other Exercises: Noted spasticity in R UE with rapid movement this session that has not been present previously.     General Comments General comments (skin integrity, edema, etc.): Pt very lethargic this session opening her eyes but with decreased interaction this session.  Pertinent Vitals/Pain Pain Assessment: Faces Faces Pain Scale: Hurts little more Pain Location: generalized with mobility Pain Descriptors / Indicators: Grimacing Pain Intervention(s): Monitored during session;Limited activity  within patient's tolerance;Repositioned           PT Goals (current goals can now be found in the care plan section) Acute Rehab PT Goals Patient Stated Goal: none stated Progress towards PT goals: Not progressing toward goals - comment (lethargic)    Frequency    Min 3X/week      PT Plan Current plan remains appropriate    Co-evaluation PT/OT/SLP Co-Evaluation/Treatment: Yes Reason for Co-Treatment: Complexity of the patient's impairments (multi-system involvement);Necessary to address cognition/behavior during functional activity;To address functional/ADL transfers;For patient/therapist safety PT goals addressed during session: Mobility/safety with mobility;Balance;Strengthening/ROM OT goals addressed during session: ADL's and self-care      AM-PAC PT "6 Clicks" Daily Activity  Outcome Measure  Difficulty turning over in bed (including adjusting bedclothes, sheets and blankets)?: Unable Difficulty moving from lying on back to sitting on the side of the bed? : Unable Difficulty sitting down on and standing up from a chair with arms (e.g., wheelchair, bedside commode, etc,.)?: Unable Help needed moving to and from a bed to chair (including a wheelchair)?: Total Help needed walking in hospital room?: Total Help needed climbing 3-5 steps with a railing? : Total 6 Click Score: 6    End of Session   Activity Tolerance: Patient limited by lethargy Patient left: in bed;with call bell/phone within reach;with restraints reapplied Nurse Communication: Mobility status PT Visit Diagnosis: Muscle weakness (generalized) (M62.81);Other symptoms and signs involving the nervous system (R29.898);Other abnormalities of gait and mobility (R26.89)     Time: 1610-9604 PT Time Calculation (min) (ACUTE ONLY): 31 min  Charges:  $Therapeutic Activity: 8-22 mins          Sylvie Mifsud B. Disney Ruggiero, PT, DPT [phone]            08/24/2017, 2:02 PM

## 2017-08-25 MED ORDER — SODIUM CHLORIDE 0.45 % IV SOLN: INTRAVENOUS | Status: DC

## 2017-08-25 MED ORDER — SODIUM CHLORIDE 0.45 % IV SOLN
INTRAVENOUS | Status: DC
  Administered 2017-08-25 – 2017-08-28 (×3): via INTRAVENOUS

## 2017-08-25 NOTE — Progress Notes (Addendum)
PULMONARY / CRITICAL CARE MEDICINE   Name: Suzanne Brooks MRN: [id number] DOB: 04/10/1976    ADMISSION DATE:  07/30/2017 CONSULTATION DATE:  07/30/2017  REFERRING MD:  Conchita Paris  CHIEF COMPLAINT:  SAH  HISTORY OF PRESENT ILLNESS:  41 y.o. female presented with seizures and found to have SAH and Lt frontal lobe ICH.  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 PMHx of HTN.  SUBJECTIVE:  No acute events overnight Remains on RA More awake this morning  VITAL SIGNS: BP (!) 145/116   Pulse 96   Temp 98.5 F (36.9 C) (Oral)   Resp 15   Ht  (1.6 m)   Wt 137 lb 12.6 oz (62.5 kg)   LMP  (LMP Unknown) Comment: neg preg test 07/30/17  SpO2 99%   BMI 24.41 kg/m   INTAKE / OUTPUT: I/O last 3 completed shifts: In: 4681.7 [I.V.:2601.7; NG/GT:2080] Out: 2765 [Urine:2000; Drains:240; Stool:525]  PHYSICAL EXAMINATION: General:  Chronically ill appearing female sitting up in chair in NAD HEENT: MM pink/moist, pupils 3/reactive, cortrak right nare, EVD in place  Neuro: Awake, speaks softly, oriented to person, f/c, moves ext except RLE CV:  rrr, no m/r/g PULM: even/non-labored, lungs bilaterally clear GI: soft, non-tender, bs active  Extremities: warm/dry, no edema, bilateral mittens in place Skin: no rashes  LABS:  BMET  Recent Labs Lab 08/21/17 0609  08/22/17 0630  08/24/17 0132 08/24/17 0643 08/24/17 1409  NA 148*  147*  < > 150*  < > 146* 147* 146*  K 3.3*  --  3.2*  --   --   --  3.7  CL 124*  --  121*  --   --   --  114*  CO2 19*  --  21*  --   --   --  21*  BUN 23*  --  18  --   --   --  19  CREATININE 0.60  --  0.50  --   --   --  0.67  GLUCOSE 116*  --  113*  --   --   --  104*  < > = values in this interval not displayed. Electrolytes  Recent Labs Lab 08/19/17 0605  08/21/17 0609 08/22/17 0630 08/24/17 1409  CALCIUM 10.5*  < > 9.9 9.7 9.9  MG 2.3  --  2.3 2.2  --   PHOS 2.8  --   --  3.9  --   < > = values in this interval not  displayed. CBC  Recent Labs Lab 08/19/17 0605 08/21/17 0609 08/24/17 1409  WBC 28.8* 17.8* 9.8  HGB 8.0* 8.5* 9.8*  HCT 25.5* 27.8* 32.0*  PLT 403* 305 PLATELET CLUMPS NOTED ON SMEAR, COUNT APPEARS ADEQUATE   Coag's No results for input(s): APTT, INR in the last 168 hours. Sepsis Markers  Recent Labs Lab 08/19/17 0605  PROCALCITON 0.15    Glucose No results for input(s): GLUCAP in the last 168 hours. Imaging No results found.  STUDIES:  UDS 9/13:  Negative  CT HEAD W/O 9/13: IMPRESSION: 1. Extensive subarachnoid hemorrhage throughout the basilar cisterns, sylvian fissure and interhemispheric fissure question ruptured aneurysm. 2. Additional intra cerebral hematoma within the inferior LEFT frontal lobe 3.5 x 1.4 x 1.8 cm, un common to have intraparenchymal extension of subarachnoid hemorrhage, question concomitant or subsequent fall with traumatic hemorrhage? CT HEAD W/O 9/17: IMPRESSION: 1. Satisfactory extraventricular drainage catheter placement. Slight decrease in ventricular size, particularly temporal horns. Resolving parenchymal and subarachnoid blood. 2.  No visible cortical infarction, but cannot exclude early ischemic change in the LEFT posterior limb internal capsule, as well as scattered areas of the LEFT frontal subcortical white matter. CSF 9/20:  RBC 14025 / Glucose 74 / Total Protein 43 / WBC 45 (neutrophils 88%, lymphocytes 8%, & monocytes 4%) CT HEAD W/O 9/27: IMPRESSION: 1. 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 Residual intraventricular hemorrhage. 2. 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 3. Mild leptomeningeal enhancement presumably from recent subarachnoid hemorrhage. CTA HEAD 9/27: IMPRESSION: 1. Severe vasospasm of anterior greater than posterior circulation. No emergent large vessel occlusion. 2. Status post LEFT paraophthalmic aneurysm coil embolization. 3. 3 mm RIGHT paraophthalmic  aneurysm. PORT CXR 9/29:  Personally reviewed by me. Right upper extremity PICC line and change to position. No focal opacity or effusion appreciated. Normal mediastinal contour and heart border.  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 Started on albumin for vasospasm by neurosurgery. 09/30 - On Neo-Synephrine at 400. More awake & intermittently following commands. Nonsensical speech. 10/5 off alpha agents  LINES/TUBES: OETT 9/13 - 9/15 EVD 9/14 >>> RUE DL PICC 1/61 >>> L RAD ART LINE 9/28 >>> 10/1 (pt pulled) FOLEY 9/28 >>> L NGT >>> PIV  ASSESSMENT / PLAN: Day 20 post SAH. S/P opthalmic artery coiling 41 y.o. female with extensive subarachnoid hemorrhage and left intracerebral hematoma/hemorrhage. Status post ophthalmic artery aneurysm coiling. 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  Vasospasm resolved Hydro worsening with clamping drain over weekend P: EVD drain per NSGY  Plan for laparoscopic VP placement later this week Continue nimodipine ( may extend past with last vasospasm) Keppra maintain as she had seizures on admission Frequent neuro exams  Dysphagia  P: SLP following TF per Nutrition  Follow intake by mouth May need PEG  Diarrhea - C-diff negative P: Imodium, may need schedule this and NOT prn Ensure vital Trend BMET, Mag,  Phos  Hypokalemia P: Monitor bmet   Leukocytosis, high risk drain infection with duration - leukocytosis improved, afebrile  P:  if fever, sample drain csf  Anemia - without evidence of bleeding  - stable h/h 10/8 P: -Trend CBC    Hyperglycemia  P: Monitor glucose on BMP   Posey Boyer, AGACNP-BC [facility] Pgr: [phone] or if no answer [phone] 08/25/2017, 10:05 AM   STAFF NOTE: I, Rory Percy, MD FACP have personally reviewed patient's available data, including medical history, events of note, physical examination and test results as part of my evaluation. I have discussed with resident/NP and other care providers such as pharmacist, RN and RRT. In addition, I personally evaluated patient and elicited key findings of: awake in chair, no distress, NGT in place, follows commands, CTA, abdo soft, was agin 1.4 liters pos and no longer with vasospasm on last TCD and clinically progressing, would agree peg likely required during this phase of her illness, for shint end opf week, drain  per NS, consider move to sdu once shunt placed, consider further correction Na, chem in am , iS as able, I updated pt in full maintain HTN meds, BB, would prolong nimodipine dosing given late vasospasm, keppra maintain   Mcarthur Rossetti. Tyson Alias, MD, FACP Pgr: [phone] [facility] 08/25/2017 11:34 AM

## 2017-08-25 NOTE — Progress Notes (Signed)
Physical Therapy Treatment Patient Details Name: Suzanne Brooks MRN: [id number] DOB: December 09, 1975 Today's Date: 08/25/2017    History of Present Illness Pt is a 41 y/o female who presents with seizure activity. 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 Pt awake and alert throughout fidgeting in supine and sitting with need for frequent repositioning before and during session. Pt educated for date and deficits with improved function and assist from last session. Pt with continued RLE buckling but able to stand and pivot today. Will continue to follow to progress balance and mobility.   156/110 supine, HR 95 145/116 sitting, HR 94   Follow Up Recommendations  CIR;Supervision/Assistance - 24 hour     Equipment Recommendations  Wheelchair (measurements PT);Wheelchair cushion (measurements PT)    Recommendations for Other Services       Precautions / Restrictions Precautions Precautions: Fall Precaution Comments: clamp EVD prior to moving the bed/mobilizing pt.  Required Braces or Orthoses: Other Brace/Splint Other Brace/Splint: bil mittens; wrist restraints  Restrictions Weight Bearing Restrictions: No    Mobility  Bed Mobility Overal bed mobility: Needs Assistance Bed Mobility: Rolling;Sidelying to Sit Rolling: Total assist Sidelying to sit: Max assist       General bed mobility comments: cues for sequence with pt not providing significant assist to roll. She assisted with LUE for pushing from side to sit with assist for balance in sitting as pt leaning posteriorly. Sitting EOB cues and knee blocked as pt pushing posteriorly and sliding on bed with repositioning posteriorly EOB to prevent sliding off.   Transfers Overall transfer level: Needs assistance   Transfers: Sit  to/from Stand;Stand Pivot Transfers Sit to Stand: Max assist;+2 physical assistance Stand pivot transfers: Max assist;+2 physical assistance       General transfer comment: cues for sequence with pt support at trunk with RLE blocked and assisted to rise with max assist to control RLE and pivot pelvis to chair. Total assist +2 to scoot back in chair x 2 trials with assist of pad.   Ambulation/Gait             General Gait Details: unable at this time.    Stairs            Wheelchair Mobility    Modified Rankin (Stroke Patients Only) Modified Rankin (Stroke Patients Only) Pre-Morbid Rankin Score: No symptoms Modified Rankin: Severe disability     Balance Overall balance assessment: Needs assistance   Sitting balance-Leahy Scale: Zero Sitting balance - Comments: posterior lean. with constant movement of bil UE onto lap to prevent pushing posteriorly. EOB 8 min Postural control: Posterior lean   Standing balance-Leahy Scale: Zero                              Cognition Arousal/Alertness: Awake/alert Behavior During Therapy: Flat affect Overall Cognitive Status: Impaired/Different from baseline Area of Impairment: Attention;Following commands;Orientation                 Orientation Level: Disoriented to;Time Current Attention Level: Focused Memory: Decreased short-term memory Following Commands: Follows one step commands inconsistently Safety/Judgement: Decreased awareness of safety;Decreased awareness of deficits   Problem Solving: Slow processing;Decreased initiation;Difficulty sequencing;Requires tactile cues;Requires verbal cues General Comments: pt with eyes open,  able to state "May" "1975" for orientation but otherwise not responding to questions. Singing something at times, pt tracking movement throughout session      Exercises General Exercises - Lower Extremity Long Arc Quad: AAROM;PROM;Right;Left;Seated;10 reps (pt assisted with 5  reps on LLE and did not provide assist after those reps) Hip Flexion/Marching: PROM;Both;Seated;10 reps    General Comments        Pertinent Vitals/Pain Pain Assessment:  (CPOT= 0)    Home Living                      Prior Function            PT Goals (current goals can now be found in the care plan section) Acute Rehab PT Goals Patient Stated Goal: none stated Time For Goal Achievement: 09/08/17 Potential to Achieve Goals: Fair Progress towards PT goals: Progressing toward goals (goals updated)    Frequency           PT Plan Current plan remains appropriate    Co-evaluation              AM-PAC PT "6 Clicks" Daily Activity  Outcome Measure  Difficulty turning over in bed (including adjusting bedclothes, sheets and blankets)?: Unable Difficulty moving from lying on back to sitting on the side of the bed? : Unable Difficulty sitting down on and standing up from a chair with arms (e.g., wheelchair, bedside commode, etc,.)?: Unable Help needed moving to and from a bed to chair (including a wheelchair)?: Total Help needed walking in hospital room?: Total Help needed climbing 3-5 steps with a railing? : Total 6 Click Score: 6    End of Session   Activity Tolerance: Patient tolerated treatment well Patient left: in chair;with call bell/phone within reach;with chair alarm set;with restraints reapplied Nurse Communication: Mobility status;Need for lift equipment;Precautions PT Visit Diagnosis: Muscle weakness (generalized) (M62.81);Other symptoms and signs involving the nervous system (R29.898);Other abnormalities of gait and mobility (R26.89)     Time: 1610-9604 PT Time Calculation (min) (ACUTE ONLY): 24 min  Charges:  $Therapeutic Activity: 23-37 mins                    G Codes:       Suzanne Brooks, PT [phone]   Suzanne Brooks 08/25/2017, 10:08 AM

## 2017-08-25 NOTE — Progress Notes (Signed)
No issues overnight. Unable to tolerate EVD clamping over weekend, CT demonstrated increased ventriculomegaly and pt found to be more lethargic/somnolent.  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

## 2017-08-26 LAB — BASIC METABOLIC PANEL
ANION GAP: 11 (ref 5–15)
BUN: 18 mg/dL (ref 6–20)
CHLORIDE: 107 mmol/L (ref 101–111)
CO2: 24 mmol/L (ref 22–32)
Calcium: 10 mg/dL (ref 8.9–10.3)
Creatinine, Ser: 0.48 mg/dL (ref 0.44–1.00)
GFR calc Af Amer: >60 mL/min (ref >60)
Glucose, Bld: 106 mg/dL — ABNORMAL HIGH (ref 65–99)
Potassium: 3.6 mmol/L (ref 3.5–5.1)
SODIUM: 142 mmol/L (ref 135–145)

## 2017-08-26 LAB — PROTIME-INR
INR: 1.02
PROTHROMBIN TIME: 13.3 s (ref 11.4–15.2)

## 2017-08-26 MED ORDER — HYDRALAZINE HCL 20 MG/ML IJ SOLN
10.0000 mg | INTRAMUSCULAR | Status: DC | PRN
  Administered 2017-08-26: 10 mg via INTRAVENOUS
  Filled 2017-08-26 (×2): qty 1

## 2017-08-26 MED ORDER — LABETALOL HCL 100 MG PO TABS
100.0000 mg | ORAL_TABLET | Freq: Two times a day (BID) | ORAL | Status: DC
  Administered 2017-08-26 – 2017-08-27 (×4): 100 mg via ORAL
  Filled 2017-08-26 (×4): qty 1

## 2017-08-26 MED ORDER — NIMODIPINE 60 MG/20ML PO SOLN
60.0000 mg | ORAL | Status: DC
  Administered 2017-08-26 – 2017-08-31 (×30): 60 mg via ORAL
  Filled 2017-08-26 (×30): qty 20

## 2017-08-26 NOTE — Progress Notes (Addendum)
PULMONARY / CRITICAL CARE MEDICINE   Name: Suzanne Brooks MRN: [id number] DOB: Nov 20, 1975    ADMISSION DATE:  07/30/2017 CONSULTATION DATE:  07/30/2017  REFERRING MD:  Kathyrn Sheriff  CHIEF COMPLAINT:  SAH  HISTORY OF PRESENT ILLNESS:  41 y.o. female presented with seizures and found to have [employer] and Lt frontal lobe ICH.  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 PMHx of HTN.  SUBJECTIVE:  Low appetite Shunt planned am  VITAL SIGNS: BP (!) 133/103   Pulse 86   Temp 98.4 F (36.9 C) (Oral)   Resp 15   Ht 5' 3"  (1.6 m)   Wt 62.1 kg (136 lb 14.5 oz)   LMP  (LMP Unknown) Comment: neg preg test 07/30/17  SpO2 100%   BMI 24.25 kg/m   INTAKE / OUTPUT: I/O last 3 completed shifts: In: 3645 [I.V.:1550; NG/GT:2095] Out: 2174 [Urine:1650; Drains:249; Stool:275]  PHYSICAL EXAMINATION: General: awake,. No distress Neuro: awake, O x 1, moves uppers weak rt, left leg limited HEENT: perr PULM: CTA CV: s1 s2 RR GI: soft, BS wnl, no r, stool loose Extremities: limited edema , no rash   LABS:  BMET  Recent Labs Lab 08/22/17 0630  08/24/17 0643 08/24/17 1409 08/26/17 0248  NA 150*  < > 147* 146* 142  K 3.2*  --   --  3.7 3.6  CL 121*  --   --  114* 107  CO2 21*  --   --  21* 24  BUN 18  --   --  19 18  CREATININE 0.50  --   --  0.67 0.48  GLUCOSE 113*  --   --  104* 106*  < > = values in this interval not displayed. Electrolytes  Recent Labs Lab 08/21/17 0609 08/22/17 0630 08/24/17 1409 08/26/17 0248  CALCIUM 9.9 9.7 9.9 10.0  MG 2.3 2.2  --   --   PHOS  --  3.9  --   --    CBC  Recent Labs Lab 08/21/17 0609 08/24/17 1409  WBC 17.8* 9.8  HGB 8.5* 9.8*  HCT 27.8* 32.0*  PLT 305 PLATELET CLUMPS NOTED ON SMEAR, COUNT APPEARS ADEQUATE   Coag's No results for input(s): APTT, INR in the last 168 hours. Sepsis Markers No results for input(s): LATICACIDVEN, PROCALCITON, O2SATVEN in the last 168 hours.  Glucose No results for input(s): GLUCAP in  the last 168 hours. Imaging No results found.  STUDIES:  UDS 9/13:  Negative  CT HEAD W/O 9/13: IMPRESSION: 1. Extensive subarachnoid hemorrhage throughout the basilar cisterns, sylvian fissure and interhemispheric fissure question ruptured aneurysm. 2. Additional intra cerebral hematoma within the inferior LEFT frontal lobe 3.5 x 1.4 x 1.8 cm, un common to have intraparenchymal extension of subarachnoid hemorrhage, question concomitant or subsequent fall with traumatic hemorrhage? CT HEAD W/O 9/17: IMPRESSION: 1. Satisfactory extraventricular drainage catheter placement. Slight decrease in ventricular size, particularly temporal horns. Resolving parenchymal and subarachnoid blood. 2. No visible cortical infarction, but cannot exclude early ischemic change in the LEFT posterior limb internal capsule, as well as scattered areas of the LEFT frontal subcortical white matter. CSF 9/20:  RBC 14025 / Glucose 74 / Total Protein 43 / WBC 45 (neutrophils 88%, lymphocytes 8%, & monocytes 4%) CT HEAD W/O 9/27: IMPRESSION: 1. 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 Residual intraventricular hemorrhage. 2. 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 3. Mild leptomeningeal enhancement presumably from recent subarachnoid hemorrhage. CTA HEAD 9/27: IMPRESSION: 1. Severe vasospasm of  anterior greater than posterior circulation. No emergent large vessel occlusion. 2. Status post LEFT paraophthalmic aneurysm coil embolization. 3. 3 mm RIGHT paraophthalmic aneurysm. PORT CXR 9/29:  Personally reviewed by me. Right upper extremity PICC line and change to position. No focal opacity or effusion appreciated. Normal mediastinal contour and heart border.  MICROBIOLOGY: MRSA PCR 9/13:  Negative Urine Culture 9/16:  Multiple Species Present Blood Cultures x2 9/16:  Negative  Stool C diff 9/19: Negative  CSF Culture 9/20:  Negative Urine Culture 9/20:  Negative   Urine Culture 9/25:  Negative Blood Cultures x2 9/25: Negative  Blood Cultures 10/2: neg  ANTIBIOTICS: Tressie Ellis 9/16 - 9/25 Vancomycin 9/16 - 9/25  SIGNIFICANT EVENTS: 09/13 - Admit 09/14 - Angiogram w/ coiling of left opthalmic artery aneurysm 09/19 - ID consulted for persistent fevers 09/29 - Remains on Neo-Syncephine for goal SBP. Started on albumin for vasospasm by neurosurgery. 09/30 - On Neo-Synephrine at 400. More awake & intermittently following commands. Nonsensical speech. 10/5 off alpha agents  LINES/TUBES: OETT 9/13 - 9/15 EVD 9/14 >>> RUE DL PICC 9/27 >>> L RAD ART LINE 9/28 >>> 10/1 (pt pulled) FOLEY 9/28 >>> L NGT >>> PIV  ASSESSMENT / PLAN: Day 20 post SAH. S/P opthalmic artery coiling 41 y.o. female with extensive subarachnoid hemorrhage and left intracerebral hematoma/hemorrhage. Status post ophthalmic artery aneurysm coiling. 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  Vasospasm resolved Hydro worsening with clamping drain over weekend P: shunt in OR In am  EVD open Nimodipine, re add to 28 days total with later vasospasm and remaining HTN keppra  HTN, diastolic predominant -maintain nimodiopiine as able for 7 more days Add scheduled BB  Dysphagia  P: Hold TF and assess intake totoals May need PEG  Diarrhea - C-diff negative P: Imodium, use it Slight improvement NPO midnight for OR  Hypokalemia P: Monitor bmet in am   Leukocytosis, high risk drain infection with duration P:  if fever, sample drain csf  Anemia - without evidence of bleeding  - stable h/h 10/8 P: -repeat coags pre op , high risk vit K def in house for as long as she has been here  Hyperglycemia  P: Monitor glucose on BMP NICE 140-180 met   Lavon Paganini. Titus Mould, MD, [place] Pgr: [facility] 08/26/2017 11:05 AM

## 2017-08-26 NOTE — Progress Notes (Signed)
  Speech Language Pathology Treatment: Dysphagia;Cognitive-Linquistic  Patient Details Name: Suzanne Brooks MRN: [id number] DOB: 07-Oct-1976 Today's Date: 08/26/2017 Time: 0900-0930 SLP Time Calculation (min) (ACUTE ONLY): 30 min  Assessment / Plan / Recommendation Clinical Impression  Pt sustained arousal through am meal, moderate verbal and contextual redirections needed due to internal distractor - itchy. Pt again tolerated dys 2 and thin liquids with prolonged mastication but no signs of aspiration. Pt was willing to accept about 15% of meal (which was very large) but then verbalized she was done. Asked MD to consider holding tube foods with calorie count to stimulate appetite and determine if alternate nutrition still needed. SLP able to facilitate functional communication with choice cues for POs, making requests with itching and provided casual conversation during session. Pt initially only responded at word level, but then engaged in conversation about her skin and also told me she had "cheerleader legs" and told me she cheered in [place] in high school. Volume low but language and content appropriate. Will continue efforts, recommend CIR at d/c.   HPI HPI: Pt is a 41 y/o female who presents with seizure activity. 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 ETT 9/13-9/15.  FEES 9/21 with recs for NPO.  MBS has been on hold due to poor MS.  Improving since 10/4.       SLP Plan  Continue with current plan of care       Recommendations  Diet recommendations: Thin liquid;Dysphagia 2 (fine chop) Liquids provided via: Cup;Straw Medication Administration: Whole meds with puree Supervision: Staff to assist with self feeding;Full supervision/cueing for compensatory strategies Compensations: Slow rate;Small sips/bites;Minimize environmental distractions;Monitor for anterior loss;Follow solids with liquid Postural Changes  and/or Swallow Maneuvers: Seated upright 90 degrees                General recommendations: Rehab consult Oral Care Recommendations: Oral care BID Follow up Recommendations: Inpatient Rehab SLP Visit Diagnosis: Dysphagia, oropharyngeal phase (R13.12);Attention and concentration deficit Plan: Continue with current plan of care       GO               [facility], [place] CCC-SLP [phone]  Claudine Mouton 08/26/2017, 9:37 AM

## 2017-08-26 NOTE — Consult Note (Signed)
[facility] Surgery Consult Note  Suzanne Brooks 16-Jan-1976  [id number].    Requesting MD: Kathyrn Sheriff Chief Complaint/Reason for Consult: VP shunt placement HPI:  Patient is a 41 y/o female who presented to [facility] 9/13 after a witnessed seizure and hx of seizures. CT scan showed diffuse subarachnoid hemorrhage and patient taken to IR suite for angiogram and coiling of left ophthalmic aneurysm. Follow up CT scans showed progressive ventriculomegaly, patient underwent right frontal ventriculostomy with placement of EVD 9/14. CT demonstrated increased ventriculomegaly 10/7. General surgery consulted for placement of VP shunt tomorrow. Patient oriented to self but nothing else. Reports she has been eating some and has a small appetite. Patient reports no past abdominal surgeries and none are listed in her chart.  ROS: Review of Systems  Unable to perform ROS: Mental acuity  Gastrointestinal: Positive for diarrhea. Negative for abdominal pain, nausea and vomiting.    Family History  Problem Relation Age of Onset  . Stroke Maternal Aunt   . Aneurysm Maternal Aunt   . Aneurysm Cousin        died after aneurysm    Past Medical History:  Diagnosis Date  . Hypertension   . Seizures ([place])     Past Surgical History:  Procedure Laterality Date  . IR 3D INDEPENDENT WKST  07/30/2017  . IR ANGIO INTRA EXTRACRAN SEL INTERNAL CAROTID BILAT MOD SED  07/30/2017  . IR ANGIO VERTEBRAL SEL VERTEBRAL UNI R MOD SED  07/30/2017  . IR ANGIOGRAM FOLLOW UP STUDY  07/30/2017  . IR ANGIOGRAM FOLLOW UP STUDY  07/30/2017  . IR ANGIOGRAM FOLLOW UP STUDY  07/30/2017  . IR TRANSCATH/EMBOLIZ  07/30/2017  . RADIOLOGY WITH ANESTHESIA N/A 07/30/2017   Procedure: RADIOLOGY WITH ANESTHESIA/ DR. Bradly Chris;  Surgeon: Radiologist, Medication, MD;  Location: [facility];  Service: Radiology;  Laterality: N/A;    Social History:  reports that she has never smoked. She has never used smokeless tobacco. She reports that she drinks  alcohol. She reports that she uses drugs, including Marijuana.  Allergies: No Known Allergies  No prescriptions prior to admission.    Blood pressure (!) 133/103, pulse 86, temperature 98.4 F (36.9 C), temperature source Oral, resp. rate 15, height 5' 3"  (1.6 m), weight 62.1 kg (136 lb 14.5 oz), SpO2 100 %. Physical Exam: Physical Exam  Constitutional: She appears well-developed and well-nourished.  Non-toxic appearance. No distress.  HENT:  Right Ear: External ear normal.  Left Ear: External ear normal.  Mouth/Throat: Oropharynx is clear and moist and mucous membranes are normal. Normal dentition.  EVD present; Cortrak present  Eyes: Pupils are equal, round, and reactive to light. Conjunctivae and lids are normal.  Neck: Normal range of motion. Neck supple. No thyromegaly present.  Cardiovascular: Normal rate and regular rhythm.   Pulses:      Radial pulses are 2+ on the right side, and 2+ on the left side.       Dorsalis pedis pulses are 2+ on the right side, and 2+ on the left side.  Pulmonary/Chest: Effort normal and breath sounds normal.  Abdominal: Soft. Normal appearance and bowel sounds are normal. She exhibits no distension and no mass. There is no hepatosplenomegaly. There is no tenderness. There is no rigidity and no guarding. No hernia.  Genitourinary:  Genitourinary Comments: [place] device present  Musculoskeletal:  No gross deformities of bilateral upper and lower extremities.  Neurological: She is alert. She is disoriented (To place, time and situation). GCS eye subscore is 4. GCS verbal  subscore is 5. GCS motor subscore is 6.  Skin: Skin is warm, dry and intact. No rash noted. She is not diaphoretic.  Psychiatric:  Wrist restraints and mittens present    Results for orders placed or performed during the hospital encounter of 07/30/17 (from the past 48 hour(s))  Basic metabolic panel     Status: Abnormal   Collection Time: 08/24/17  2:09 PM  Result Value Ref  Range   Sodium 146 (H) 135 - 145 mmol/L   Potassium 3.7 3.5 - 5.1 mmol/L   Chloride 114 (H) 101 - 111 mmol/L   CO2 21 (L) 22 - 32 mmol/L   Glucose, Bld 104 (H) 65 - 99 mg/dL   BUN 19 6 - 20 mg/dL   Creatinine, Ser 0.67 0.44 - 1.00 mg/dL   Calcium 9.9 8.9 - 10.3 mg/dL   GFR calc non Af Amer >60 >60 mL/min   GFR calc Af Amer >60 >60 mL/min    Comment: (NOTE) The eGFR has been calculated using the CKD EPI equation. This calculation has not been validated in all clinical situations. eGFR's persistently <60 mL/min signify possible Chronic Kidney Disease.    Anion gap 11 5 - 15  CBC with Differential/Platelet     Status: Abnormal   Collection Time: 08/24/17  2:09 PM  Result Value Ref Range   WBC 9.8 4.0 - 10.5 K/uL    Comment: REPEATED TO VERIFY WHITE COUNT CONFIRMED ON SMEAR    RBC 3.61 (L) 3.87 - 5.11 MIL/uL   Hemoglobin 9.8 (L) 12.0 - 15.0 g/dL   HCT 32.0 (L) 36.0 - 46.0 %   MCV 88.6 78.0 - 100.0 fL   MCH 27.1 26.0 - 34.0 pg   MCHC 30.6 30.0 - 36.0 g/dL   RDW 20.7 (H) 11.5 - 15.5 %   Platelets  150 - 400 K/uL    PLATELET CLUMPS NOTED ON SMEAR, COUNT APPEARS ADEQUATE   Neutrophils Relative % 71 %   Lymphocytes Relative 13 %   Monocytes Relative 7 %   Eosinophils Relative 9 %   Basophils Relative 0 %   Neutro Abs 6.9 1.7 - 7.7 K/uL   Lymphs Abs 1.3 0.7 - 4.0 K/uL   Monocytes Absolute 0.7 0.1 - 1.0 K/uL   Eosinophils Absolute 0.9 (H) 0.0 - 0.7 K/uL   Basophils Absolute 0.0 0.0 - 0.1 K/uL   WBC Morphology MILD LEFT SHIFT (1-5% METAS, OCC MYELO, OCC BANDS)   Basic metabolic panel     Status: Abnormal   Collection Time: 08/26/17  2:48 AM  Result Value Ref Range   Sodium 142 135 - 145 mmol/L   Potassium 3.6 3.5 - 5.1 mmol/L   Chloride 107 101 - 111 mmol/L   CO2 24 22 - 32 mmol/L   Glucose, Bld 106 (H) 65 - 99 mg/dL   BUN 18 6 - 20 mg/dL   Creatinine, Ser 0.48 0.44 - 1.00 mg/dL   Calcium 10.0 8.9 - 10.3 mg/dL   GFR calc non Af Amer >60 >60 mL/min   GFR calc Af Amer >60  >60 mL/min    Comment: (NOTE) The eGFR has been calculated using the CKD EPI equation. This calculation has not been validated in all clinical situations. eGFR's persistently <60 mL/min signify possible Chronic Kidney Disease.    Anion gap 11 5 - 15   No results found.    Assessment/Plan Need for VP shunt - plan for laparoscopic VP shunt placement with NS tomorrow - hold TF  and keep NPO after MN    Brigid Re, [facility] Surgery 08/26/2017, 10:48 AM Pager: [phone] Consults: [phone] Mon-Fri 7:00 am-4:30 pm Sat-Sun 7:00 am-11:30 am

## 2017-08-26 NOTE — Progress Notes (Signed)
No issues overnight. Pt without complaints this am  EXAM:  BP (!) 159/114   Pulse 89   Temp 97.9 F (36.6 C)   Resp 15   Ht  (1.6 m)   Wt 62.1 kg (136 lb 14.5 oz)   LMP  (LMP Unknown) Comment: neg preg test 07/30/17  SpO2 100%   BMI 24.25 kg/m   Awake, alert Answers questions appropriately CN grossly intact  Moves BUE/LLE EVD in place  IMPRESSION:  41 y.o. female s/p SAH with communicating HCP Malnutrition  PLAN: - VP shunt tomorrow with Dr. Derrell Lolling for laparoscopic assistance - Will also likely need PEG later

## 2017-08-26 NOTE — Progress Notes (Signed)
Nutrition Follow-up  INTERVENTION:   Magic cup TID with meals, each supplement provides 290 kcal and 9 grams of protein  Vital 1.5 @ 55 ml/hr (1320 ml/day) Provides: 1980 kcal, 89 grams protein, and 1008 ml free water.  NUTRITION DIAGNOSIS:   Inadequate oral intake related to dysphagia as evidenced by meal completion < 50%. Ongoing.   GOAL:   Patient will meet greater than or equal to 90% of their needs Met.   MONITOR:   PO intake, TF tolerance, Skin, I & O's  ASSESSMENT:   41 year old female who presented to ED with seizures found to be hypertensive with aneurysmal SAH, ICH. Coiled in IR 9/13. Post-op to ICU on vent.   10/3 Cortrak tube Plan for shunt 10/11 Per RN PO intake limited Monitor intake and adjust TF as appropriate.   Medications reviewed and include: MVI, 20 mEq KCl, zantac Labs reviewed Weight stable  Diet Order:  DIET DYS 2 Room service appropriate? Yes; Fluid consistency: Thin Diet NPO time specified  Skin:   (MASD buttocks)  Last BM:  10/10 small  Height:   Ht Readings from Last 1 Encounters:  07/30/17 _0  (1.6 m)    Weight:   Wt Readings from Last 1 Encounters:  08/26/17 136 lb 14.5 oz (62.1 kg)    Ideal Body Weight:  52.2 kg  BMI:  Body mass index is 24.25 kg/m.  Estimated Nutritional Needs:   Kcal:  1880-2070 (30-33 kcal/kg)  Protein:  82-94 grams (1.3-1.5 grams/kg)  Fluid:  > 1.5 L/day  EDUCATION NEEDS:   No education needs identified at this time  Lyons, Kimberly, [place] Pager [phone] After Hours Pager

## 2017-08-27 ENCOUNTER — Encounter (HOSPITAL_COMMUNITY)
Admission: EM | Disposition: A | Discharge status: Rehabilitation Facility | Payer: Self-pay | Source: Home / Self Care | Attending: Neurosurgery

## 2017-08-27 ENCOUNTER — Inpatient Hospital Stay (HOSPITAL_COMMUNITY): Payer: Medicaid Other | Admitting: Certified Registered Nurse Anesthetist

## 2017-08-27 ENCOUNTER — Encounter (HOSPITAL_COMMUNITY): Payer: Self-pay | Admitting: Certified Registered Nurse Anesthetist

## 2017-08-27 HISTORY — PX: VENTRICULOPERITONEAL SHUNT: SHX204

## 2017-08-27 HISTORY — PX: LAPAROSCOPIC REVISION VENTRICULAR-PERITONEAL (V-P) SHUNT: SHX5924

## 2017-08-27 LAB — CBC
HEMATOCRIT: 29.1 % — AB (ref 36.0–46.0)
HEMOGLOBIN: 8.9 g/dL — AB (ref 12.0–15.0)
MCH: 26.9 pg (ref 26.0–34.0)
MCHC: 30.6 g/dL (ref 30.0–36.0)
MCV: 87.9 fL (ref 78.0–100.0)
Platelets: 223 10*3/uL (ref 150–400)
RBC: 3.31 MIL/uL — ABNORMAL LOW (ref 3.87–5.11)
RDW: 21 % — ABNORMAL HIGH (ref 11.5–15.5)
WBC: 13.3 10*3/uL — ABNORMAL HIGH (ref 4.0–10.5)

## 2017-08-27 LAB — BASIC METABOLIC PANEL
ANION GAP: 10 (ref 5–15)
BUN: 15 mg/dL (ref 6–20)
CHLORIDE: 103 mmol/L (ref 101–111)
CO2: 27 mmol/L (ref 22–32)
Calcium: 10 mg/dL (ref 8.9–10.3)
Creatinine, Ser: 0.54 mg/dL (ref 0.44–1.00)
GFR calc Af Amer: >60 mL/min (ref >60)
GFR calc non Af Amer: >60 mL/min (ref >60)
GLUCOSE: 83 mg/dL (ref 65–99)
POTASSIUM: 4 mmol/L (ref 3.5–5.1)
Sodium: 140 mmol/L (ref 135–145)

## 2017-08-27 LAB — GLUCOSE, CAPILLARY: Glucose-Capillary: 86 mg/dL (ref 65–99)

## 2017-08-27 SURGERY — SHUNT INSERTION VENTRICULAR-PERITONEAL
Anesthesia: General

## 2017-08-27 MED ORDER — HEMOSTATIC AGENTS (NO CHARGE) OPTIME
TOPICAL | Status: DC | PRN
  Administered 2017-08-27: 1 via TOPICAL

## 2017-08-27 MED ORDER — CEFAZOLIN SODIUM 1 G IJ SOLR
INTRAMUSCULAR | Status: AC
  Filled 2017-08-27: qty 10

## 2017-08-27 MED ORDER — THROMBIN 5000 UNITS EX SOLR
OROMUCOSAL | Status: DC | PRN
  Administered 2017-08-27: 13:00:00 via TOPICAL

## 2017-08-27 MED ORDER — BUPIVACAINE HCL (PF) 0.5 % IJ SOLN
INTRAMUSCULAR | Status: AC
  Filled 2017-08-27: qty 30

## 2017-08-27 MED ORDER — ONDANSETRON HCL 4 MG/2ML IJ SOLN
INTRAMUSCULAR | Status: DC | PRN
  Administered 2017-08-27: 4 mg via INTRAVENOUS

## 2017-08-27 MED ORDER — DEXAMETHASONE SODIUM PHOSPHATE 10 MG/ML IJ SOLN
INTRAMUSCULAR | Status: AC
  Filled 2017-08-27: qty 1

## 2017-08-27 MED ORDER — THROMBIN 5000 UNITS EX SOLR
CUTANEOUS | Status: DC | PRN
  Administered 2017-08-27: 10000 [IU] via TOPICAL

## 2017-08-27 MED ORDER — MIDAZOLAM HCL 2 MG/2ML IJ SOLN
INTRAMUSCULAR | Status: AC
  Filled 2017-08-27: qty 2

## 2017-08-27 MED ORDER — LIDOCAINE 2% (20 MG/ML) 5 ML SYRINGE
INTRAMUSCULAR | Status: AC
  Filled 2017-08-27: qty 5

## 2017-08-27 MED ORDER — FENTANYL CITRATE (PF) 250 MCG/5ML IJ SOLN
INTRAMUSCULAR | Status: AC
  Filled 2017-08-27: qty 5

## 2017-08-27 MED ORDER — SODIUM CHLORIDE 0.9 % IV SOLN
INTRAVENOUS | Status: DC | PRN
  Administered 2017-08-27 (×2): via INTRAVENOUS

## 2017-08-27 MED ORDER — PROPOFOL 10 MG/ML IV BOLUS
INTRAVENOUS | Status: AC
  Filled 2017-08-27: qty 20

## 2017-08-27 MED ORDER — BUPIVACAINE HCL (PF) 0.25 % IJ SOLN
INTRAMUSCULAR | Status: DC | PRN
  Administered 2017-08-27: 4 mL

## 2017-08-27 MED ORDER — VITAL 1.5 CAL PO LIQD
1000.0000 mL | ORAL | Status: DC
  Administered 2017-08-27 – 2017-09-01 (×6): 1000 mL
  Filled 2017-08-27 (×10): qty 1000

## 2017-08-27 MED ORDER — FENTANYL CITRATE (PF) 100 MCG/2ML IJ SOLN
INTRAMUSCULAR | Status: DC | PRN
  Administered 2017-08-27: 25 ug via INTRAVENOUS
  Administered 2017-08-27: 75 ug via INTRAVENOUS

## 2017-08-27 MED ORDER — PROPOFOL 10 MG/ML IV BOLUS
INTRAVENOUS | Status: DC | PRN
  Administered 2017-08-27: 150 mg via INTRAVENOUS

## 2017-08-27 MED ORDER — LIDOCAINE-EPINEPHRINE 1 %-1:100000 IJ SOLN
INTRAMUSCULAR | Status: AC
  Filled 2017-08-27: qty 1

## 2017-08-27 MED ORDER — THROMBIN 5000 UNITS EX SOLR
CUTANEOUS | Status: AC
  Filled 2017-08-27: qty 15000

## 2017-08-27 MED ORDER — SUGAMMADEX SODIUM 200 MG/2ML IV SOLN
INTRAVENOUS | Status: DC | PRN
  Administered 2017-08-27: 130.2 mg via INTRAVENOUS

## 2017-08-27 MED ORDER — CEFAZOLIN SODIUM-DEXTROSE 1-4 GM/50ML-% IV SOLN
INTRAVENOUS | Status: DC | PRN
  Administered 2017-08-27: 2 g via INTRAVENOUS

## 2017-08-27 MED ORDER — ROCURONIUM BROMIDE 100 MG/10ML IV SOLN
INTRAVENOUS | Status: DC | PRN
  Administered 2017-08-27: 20 mg via INTRAVENOUS
  Administered 2017-08-27: 50 mg via INTRAVENOUS

## 2017-08-27 MED ORDER — ONDANSETRON HCL 4 MG/2ML IJ SOLN
INTRAMUSCULAR | Status: AC
  Filled 2017-08-27: qty 2

## 2017-08-27 MED ORDER — MIDAZOLAM HCL 5 MG/5ML IJ SOLN
INTRAMUSCULAR | Status: DC | PRN
  Administered 2017-08-27: 2 mg via INTRAVENOUS

## 2017-08-27 MED ORDER — BUPIVACAINE HCL (PF) 0.25 % IJ SOLN
INTRAMUSCULAR | Status: AC
  Filled 2017-08-27: qty 30

## 2017-08-27 MED ORDER — BACITRACIN ZINC 500 UNIT/GM EX OINT
TOPICAL_OINTMENT | CUTANEOUS | Status: DC | PRN
  Administered 2017-08-27: 1 via TOPICAL

## 2017-08-27 MED ORDER — 0.9 % SODIUM CHLORIDE (POUR BTL) OPTIME
TOPICAL | Status: DC | PRN
  Administered 2017-08-27: 1000 mL

## 2017-08-27 MED ORDER — BACITRACIN ZINC 500 UNIT/GM EX OINT
TOPICAL_OINTMENT | CUTANEOUS | Status: AC
  Filled 2017-08-27: qty 28.35

## 2017-08-27 MED ORDER — SODIUM CHLORIDE 0.9 % IR SOLN
Status: DC | PRN
  Administered 2017-08-27: 13:00:00

## 2017-08-27 MED ORDER — PHENYLEPHRINE HCL 10 MG/ML IJ SOLN
INTRAMUSCULAR | Status: AC
Start: 2017-08-27 — End: 2017-08-27
  Filled 2017-08-27: qty 3

## 2017-08-27 MED ORDER — SUGAMMADEX SODIUM 200 MG/2ML IV SOLN
INTRAVENOUS | Status: AC
  Filled 2017-08-27: qty 2

## 2017-08-27 MED ORDER — PHENYLEPHRINE HCL 10 MG/ML IJ SOLN
INTRAMUSCULAR | Status: DC | PRN
  Administered 2017-08-27: 50 ug/min via INTRAVENOUS

## 2017-08-27 MED ORDER — DEXAMETHASONE SODIUM PHOSPHATE 10 MG/ML IJ SOLN
INTRAMUSCULAR | Status: DC | PRN
  Administered 2017-08-27: 10 mg via INTRAVENOUS

## 2017-08-27 MED ORDER — LIDOCAINE HCL (CARDIAC) 20 MG/ML IV SOLN
INTRAVENOUS | Status: DC | PRN
  Administered 2017-08-27: 80 mg via INTRAVENOUS

## 2017-08-27 SURGICAL SUPPLY — 80 items
BAG DECANTER FOR FLEXI CONT (MISCELLANEOUS) ×3 IMPLANT
BENZOIN TINCTURE PRP APPL 2/3 (GAUZE/BANDAGES/DRESSINGS) ×3 IMPLANT
BLADE CLIPPER SURG (BLADE) ×3 IMPLANT
BLADE SURG 11 STRL SS (BLADE) ×3 IMPLANT
BOOT SUTURE AID YELLOW STND (SUTURE) IMPLANT
BUR PRECISION FLUTE 5.0 (BURR) ×3 IMPLANT
CANISTER SUCT 3000ML PPV (MISCELLANEOUS) ×3 IMPLANT
CARTRIDGE OIL MAESTRO DRILL (MISCELLANEOUS) ×1 IMPLANT
CATH VENTRICULAR 7CM (Shunt) ×3 IMPLANT
CHLORAPREP W/TINT 26ML (MISCELLANEOUS) IMPLANT
CLIP RANEY DISP (INSTRUMENTS) IMPLANT
CLOSURE WOUND 1/2 X4 (GAUZE/BANDAGES/DRESSINGS) ×1
CONT SPEC 4OZ CLIKSEAL STRL BL (MISCELLANEOUS) ×3 IMPLANT
DECANTER SPIKE VIAL GLASS SM (MISCELLANEOUS) ×6 IMPLANT
DIFFUSER DRILL AIR PNEUMATIC (MISCELLANEOUS) ×3 IMPLANT
DRAPE HALF SHEET 40X57 (DRAPES) ×3 IMPLANT
DRAPE INCISE IOBAN 66X45 STRL (DRAPES) IMPLANT
DRAPE LAPAROSCOPIC ABDOMINAL (DRAPES) IMPLANT
DRAPE ORTHO SPLIT 77X108 STRL (DRAPES) ×2
DRAPE POUCH INSTRU U-SHP 10X18 (DRAPES) ×3 IMPLANT
DRAPE SURG ORHT 6 SPLT 77X108 (DRAPES) ×1 IMPLANT
DURAPREP 26ML APPLICATOR (WOUND CARE) ×6 IMPLANT
ELECT REM PT RETURN 9FT ADLT (ELECTROSURGICAL) ×3
ELECTRODE REM PT RTRN 9FT ADLT (ELECTROSURGICAL) ×1 IMPLANT
GAUZE SPONGE 2X2 8PLY STRL LF (GAUZE/BANDAGES/DRESSINGS) ×1 IMPLANT
GAUZE SPONGE 4X4 16PLY XRAY LF (GAUZE/BANDAGES/DRESSINGS) IMPLANT
GLOVE BIO SURGEON STRL SZ7 (GLOVE) IMPLANT
GLOVE BIO SURGEON STRL SZ7.5 (GLOVE) ×3 IMPLANT
GLOVE BIOGEL PI IND STRL 7.0 (GLOVE) IMPLANT
GLOVE BIOGEL PI INDICATOR 7.0 (GLOVE)
GLOVE ECLIPSE 7.0 STRL STRAW (GLOVE) ×6 IMPLANT
GLOVE EXAM NITRILE LRG STRL (GLOVE) IMPLANT
GLOVE EXAM NITRILE XL STR (GLOVE) IMPLANT
GLOVE EXAM NITRILE XS STR PU (GLOVE) IMPLANT
GLOVE INDICATOR 7.5 STRL GRN (GLOVE) ×3 IMPLANT
GOWN STRL REUS W/ TWL LRG LVL3 (GOWN DISPOSABLE) ×3 IMPLANT
GOWN STRL REUS W/ TWL XL LVL3 (GOWN DISPOSABLE) ×1 IMPLANT
GOWN STRL REUS W/TWL 2XL LVL3 (GOWN DISPOSABLE) IMPLANT
GOWN STRL REUS W/TWL LRG LVL3 (GOWN DISPOSABLE) ×6
GOWN STRL REUS W/TWL XL LVL3 (GOWN DISPOSABLE) ×2
HEMOSTAT SURGICEL 2X14 (HEMOSTASIS) IMPLANT
KIT BASIN OR (CUSTOM PROCEDURE TRAY) ×3 IMPLANT
KIT ROOM TURNOVER OR (KITS) ×3 IMPLANT
MARKER SKIN DUAL TIP RULER LAB (MISCELLANEOUS) IMPLANT
NEEDLE HYPO 25X1 1.5 SAFETY (NEEDLE) ×3 IMPLANT
NEEDLE INSUFFLATION 14GA 120MM (NEEDLE) ×3 IMPLANT
NS IRRIG 1000ML POUR BTL (IV SOLUTION) ×3 IMPLANT
OIL CARTRIDGE MAESTRO DRILL (MISCELLANEOUS) ×3
PACK LAMINECTOMY NEURO (CUSTOM PROCEDURE TRAY) ×3 IMPLANT
PAD ARMBOARD 7.5X6 YLW CONV (MISCELLANEOUS) ×9 IMPLANT
PASSER CATH 65CM DISP (NEUROSURGERY SUPPLIES) ×3 IMPLANT
SCISSORS LAP 5X35 DISP (ENDOMECHANICALS) ×3 IMPLANT
SHEATH PERITONEAL INTRO 61 (MISCELLANEOUS) IMPLANT
SHUNT STRATA 11 SNAP REG (Shunt) ×3 IMPLANT
SLEEVE ENDOPATH XCEL 5M (ENDOMECHANICALS) ×3 IMPLANT
SPONGE GAUZE 2X2 STER 10/PKG (GAUZE/BANDAGES/DRESSINGS) ×2
SPONGE LAP 4X18 X RAY DECT (DISPOSABLE) IMPLANT
SPONGE SURGIFOAM ABS GEL SZ50 (HEMOSTASIS) ×3 IMPLANT
STAPLER SKIN PROX WIDE 3.9 (STAPLE) ×3 IMPLANT
STAPLER VISISTAT 35W (STAPLE) ×3 IMPLANT
STRIP CLOSURE SKIN 1/2X4 (GAUZE/BANDAGES/DRESSINGS) ×2 IMPLANT
SUT ETHILON 3 0 PS 1 (SUTURE) IMPLANT
SUT MNCRL AB 4-0 PS2 18 (SUTURE) ×3 IMPLANT
SUT NURALON 4 0 TR CR/8 (SUTURE) ×3 IMPLANT
SUT SILK 0 TIES 10X30 (SUTURE) ×3 IMPLANT
SUT SILK 3 0 SH 30 (SUTURE) IMPLANT
SUT VIC AB 3-0 SH 27 (SUTURE) ×2
SUT VIC AB 3-0 SH 27X BRD (SUTURE) ×1 IMPLANT
SUT VIC AB 3-0 SH 8-18 (SUTURE) ×6 IMPLANT
TAPE CLOTH SURG 6X10 WHT LF (GAUZE/BANDAGES/DRESSINGS) ×3 IMPLANT
TOWEL GREEN STERILE (TOWEL DISPOSABLE) ×6 IMPLANT
TOWEL GREEN STERILE FF (TOWEL DISPOSABLE) ×3 IMPLANT
TRAY FOLEY W/METER SILVER 16FR (SET/KITS/TRAYS/PACK) ×3 IMPLANT
TRAY LAPAROSCOPIC MC (CUSTOM PROCEDURE TRAY) ×3 IMPLANT
TROCAR XCEL NON-BLD 5MMX100MML (ENDOMECHANICALS) ×3 IMPLANT
TUBE CONNECTING 12'X1/4 (SUCTIONS) ×1
TUBE CONNECTING 12X1/4 (SUCTIONS) ×2 IMPLANT
TUBING INSUFFLATION (TUBING) ×3 IMPLANT
UNDERPAD 30X30 (UNDERPADS AND DIAPERS) ×3 IMPLANT
WATER STERILE IRR 1000ML POUR (IV SOLUTION) ×3 IMPLANT

## 2017-08-27 NOTE — Transfer of Care (Signed)
Immediate Anesthesia Transfer of Care Note  Patient: Nemesis Rainwater  Procedure(s) Performed: SHUNT INSERTION VENTRICULAR-PERITONEAL (N/A ) LAPAROSCOPIC REVISION VENTRICULAR-PERITONEAL (V-P) SHUNT (N/A )  Patient Location: PACU  Anesthesia Type:General  Level of Consciousness: awake, alert  and patient cooperative  Airway & Oxygen Therapy: Patient Spontanous Breathing and Patient connected to face mask oxygen  Post-op Assessment: Report given to RN, Post -op Vital signs reviewed and stable and Patient moving all extremities X 4  Post vital signs: Reviewed and stable  Last Vitals:  Vitals:   08/27/17 0930 08/27/17 0940  BP: 127/66   Pulse: 88 86  Resp: 18 15  Temp:    SpO2: 100% 100%    Last Pain:  Vitals:   08/27/17 0738  TempSrc: Oral  PainSc:          Complications: No apparent anesthesia complications

## 2017-08-27 NOTE — Op Note (Signed)
08/27/2017  1:18 PM  PATIENT:  Suzanne Brooks  41 y.o. female  PRE-OPERATIVE DIAGNOSIS:  HYDROCEPHALUS  POST-OPERATIVE DIAGNOSIS:  HYDROCEPHALUS  PROCEDURE:  Procedure(s): SHUNT INSERTION VENTRICULAR-PERITONEAL (N/A) LAPAROSCOPIC INSERTION OF VENTRICULAR-PERITONEAL (V-P) SHUNT (N/A)  SURGEON:  Surgeon(s) and Role: Panel 1:    Lisbeth Renshaw, MD - co-surgeon  Panel 2:    * Axel Filler, MD - co-surgeon  ANESTHESIA:   local and general  EBL: 5cc Total I/O In: 150 [I.V.:150] Out: 353 [Urine:350; Drains:3]  BLOOD ADMINISTERED:none  DRAINS: VP drain   LOCAL MEDICATIONS USED:  BUPIVICAINE   SPECIMEN:  No Specimen  DISPOSITION OF SPECIMEN:  N/A  COUNTS:  YES  TOURNIQUET:  * No tourniquets in log *  DICTATION: Reubin Milan Dictation Details procedure: This surgery was done in conjunction with Dr. Conchita Paris of neurosurgery. He will dictate his portion under separate cover. After the patient was consented she was taken back to the OR and placed in the supine position with bilateral SCDs in place. Patient underwent general anesthesia. Patient was prepped and draped in the standard fashion. A timeout was called all facts verified.  A Veress needle technique was used to inspect the abdomen to 15 mmHg the right subcostal margin. Subsequent visit, trocar and camera placed intra-abdominally. There was no injury to intra-abdominal organs. There was some thin adhesions to the right upper quadrant as well as the lower midline area. A second 5 mm trochars placed in the right lower quadrant direct visualization. At this time the patient were taken down from anterior abdominal wall with sharp dissection.  An area of the right subcostal margin site was chosen for the tunneling of the VP shunt. This was the area that was used to help the insertion of the VP shunt into the abdominal cavity. Once the catheter was brought into the abdominal cavity this was placed into the pelvis and direct  visualization. There were no loops and the catheter lay flat on the viscera.  At this time the insufflation was evacuated. All trochars removed. The skin was reapproximated all trocar sites using 4-0 Monocryl subcuticular fashion. The skin was dressed with Steri-Strips gauze and tape. The patient tolerated procedure well was taken to recovery in stable condition.    PLAN OF CARE: Admit to inpatient   PATIENT DISPOSITION:  PACU - hemodynamically stable.   Delay start of Pharmacological VTE agent (>24hrs) due to surgical blood loss or risk of bleeding: As per neurosurgery

## 2017-08-27 NOTE — Progress Notes (Signed)
No issues overnight.   EXAM:  BP 127/66   Pulse 86   Temp 98.1 F (36.7 C) (Oral)   Resp 15   Ht  (1.6 m)   Wt 65.1 kg (143 lb 8.3 oz)   LMP  (LMP Unknown) Comment: neg preg test 07/30/17  SpO2 100%   BMI 25.42 kg/m   Awake, alert Speech fluent CN grossly intact  Good strength BUE, LLE EVD in place  IMPRESSION:  41 y.o. female s/p SAH with HCP  PLAN: - Lap assisted VPS today  I have again reviewed the indications, risks, benefits of the shunt with the patient's daughter over the phone. All her questions were answered, consent was obtained.

## 2017-08-27 NOTE — Op Note (Signed)
PREOP DIAGNOSIS: Non-obstructive hydrocephalus  POSTOP DIAGNOSIS: Same  PROCEDURE: 1. Laparoscopic Assisted ventriculoperitoneal shunt placement  SURGEON: Dr. Lisbeth Renshaw, MD  CO-SURGEON: Dr. Derrell Lolling, MD  ANESTHESIA: General Endotracheal  EBL: 50cc  SPECIMENS: None  DRAINS: None  COMPLICATIONS: None immediate  CONDITION: Stable to ICU  SHUNT PLACED: Med[employer] Strata II, set to 1.5  HISTORY: Suzanne Brooks is a 41 y.o. female admitted after subarachnoid hemorrhage requiring CSF diversion. Attempt was made at weaning the EVD unsuccessfully with worsening in exam and CT demonstrating progressive ventriculomegaly. VP shunt was therefore indicated. Risks, benefits, and alternatives to surgery were reviewed with the patient's family. After all questions were answered consent was obtained.  PROCEDURE IN DETAIL: The patient was brought to the operating room and transferred to the operative table. After induction of general anesthesia, the patient was positioned on the operative table in the supine position with all pressure points meticulously padded. The skin of the scalp,  neck, chest, and abdomen were prepped in the usual sterile fashion.  The previously made right frontal scalp incision was opened sharply, and the external ventricular drain was identified.  A tunnel was then created using a hemostat subcutaneously to the retroauricular region.  Counter incision was made behind the ear, and the distal shunt apparatus was then passed from a distal to proximal fashion, and the valve was seated subcutaneously.  The shunt passer was then used to tunnel from the retroauricular incision into the right upper quadrant with another counter-incision made below the clavicle.  The previously placed external ventricular drain was then clamped and removed, and using standard anatomic landmarks a 7 cm ventricular catheter was then placed in the previously placed bur hole.  Good clear CSF flow was  obtained. The catheter was then connected to the valve apparatus.  The valve was then pumped, and good distal flow was observed.  The distal portion of the shunt was then placed in the intraperitoneal space under laparoscopic guidance by Dr. Derrell Lolling, the details of this procedure are dictated in a separate report.  At this point the wounds were irrigated with copious amounts of bacitracin irrigation, and closed using 3-0 Vicryl stitches.  The skin was then closed using standard surgical skin staples.  Sterile dressings were then applied.  At the end of the case all sponge needle and instrument counts were correct.  The distal portion of the external ventricular drain was then removed and the tunnelled exit site was closed using skin staples.  The patient tolerated the procedure well and was extubated in the room and taken to the postanesthesia care unit in stable condition.

## 2017-08-27 NOTE — Anesthesia Postprocedure Evaluation (Signed)
Anesthesia Post Note  Patient: Juliya Magill  Procedure(s) Performed: SHUNT INSERTION VENTRICULAR-PERITONEAL (N/A ) LAPAROSCOPIC REVISION VENTRICULAR-PERITONEAL (V-P) SHUNT (N/A )     Patient location during evaluation: PACU Anesthesia Type: General Level of consciousness: awake Pain management: pain level controlled Vital Signs Assessment: post-procedure vital signs reviewed and stable Respiratory status: spontaneous breathing Cardiovascular status: stable Anesthetic complications: no    Last Vitals:  Vitals:   08/27/17 1600 08/27/17 1635  BP: (!) 148/105   Pulse: 84   Resp: 13   Temp:  36.6 C  SpO2: 100%     Last Pain:  Vitals:   08/27/17 1635  TempSrc: Oral  PainSc:                  Liban Guedes

## 2017-08-27 NOTE — Anesthesia Preprocedure Evaluation (Addendum)
Anesthesia Evaluation  Patient identified by MRN, date of birth, ID band Patient confused    Reviewed: Allergy & Precautions, NPO status , Patient's Chart, lab work & pertinent test results  Airway Mallampati: II  TM Distance: >3 FB     Dental  (+) Dental Advisory Given   Pulmonary neg pulmonary ROS,    breath sounds clear to auscultation       Cardiovascular hypertension,  Rhythm:Regular Rate:Normal     Neuro/Psych Seizures -,     GI/Hepatic negative GI ROS, Neg liver ROS,   Endo/Other  negative endocrine ROS  Renal/GU negative Renal ROS     Musculoskeletal   Abdominal   Peds  Hematology   Anesthesia Other Findings   Reproductive/Obstetrics                            Anesthesia Physical Anesthesia Plan  ASA: IV  Anesthesia Plan:    Post-op Pain Management:    Induction:   PONV Risk Score and Plan: 2 and Ondansetron and Dexamethasone  Airway Management Planned:   Additional Equipment:   Intra-op Plan:   Post-operative Plan: Possible Post-op intubation/ventilation  Informed Consent: I have reviewed the patients History and Physical, chart, labs and discussed the procedure including the risks, benefits and alternatives for the proposed anesthesia with the patient or authorized representative who has indicated his/her understanding and acceptance.   Dental advisory given  Plan Discussed with: CRNA, Anesthesiologist and Surgeon  Anesthesia Plan Comments:         Anesthesia Quick Evaluation

## 2017-08-27 NOTE — Anesthesia Procedure Notes (Signed)
Procedure Name: Intubation Date/Time: 08/27/2017 12:14 PM Performed by: Willeen Cass P Pre-anesthesia Checklist: Patient identified, Emergency Drugs available, Suction available and Patient being monitored Patient Re-evaluated:Patient Re-evaluated prior to induction Oxygen Delivery Method: Circle System Utilized Preoxygenation: Pre-oxygenation with 100% oxygen Induction Type: IV induction Ventilation: Mask ventilation without difficulty Laryngoscope Size: Mac and 4 Grade View: Grade I Tube type: Oral Tube size: 7.0 mm Number of attempts: 1 Airway Equipment and Method: Stylet Placement Confirmation: ETT inserted through vocal cords under direct vision,  positive ETCO2 and breath sounds checked- equal and bilateral Secured at: 22 cm Tube secured with: Tape Dental Injury: Teeth and Oropharynx as per pre-operative assessment

## 2017-08-27 NOTE — Progress Notes (Signed)
SLP Cancellation Note  Patient Details Name: Suzanne Brooks MRN: [id number] DOB: [birth year]   Cancelled treatment:       Reason Eval/Treat Not Completed: Patient at procedure or test/unavailable Ferdinand Lango MA, CCC-SLP [phone]   [name] [name] 08/27/2017, 10:20 AM

## 2017-08-27 NOTE — Progress Notes (Signed)
0930 patient transported on tele to short stay with transporter. Hand off given to short stay nurse and patient switched to the short stay tele monitor.

## 2017-08-27 NOTE — Progress Notes (Signed)
Patient returned from PACU at 1430. Patient no longer restrained. Order discontinued per protocol.

## 2017-08-28 ENCOUNTER — Encounter (HOSPITAL_COMMUNITY): Payer: Self-pay | Admitting: Neurosurgery

## 2017-08-28 MED ORDER — NYSTATIN 100000 UNIT/ML MT SUSP: 5.0000 mL | Freq: Four times a day (QID) | OROMUCOSAL | Status: DC

## 2017-08-28 MED ORDER — LABETALOL HCL 100 MG PO TABS
200.0000 mg | ORAL_TABLET | Freq: Two times a day (BID) | ORAL | Status: DC
  Administered 2017-08-28 – 2017-09-01 (×9): 200 mg via ORAL
  Filled 2017-08-28 (×9): qty 2

## 2017-08-28 NOTE — Progress Notes (Signed)
No issues overnight.   EXAM:  BP (!) 142/104   Pulse 95   Temp 98.5 F (36.9 C) (Oral)   Resp 14   Ht  (1.6 m)   Wt 65 kg (143 lb 4.8 oz)   LMP  (LMP Unknown) Comment: neg preg test 07/30/17  SpO2 100%   BMI 25.38 kg/m   Awake, alert, oriented to person, place Speech fluent, appropriate  CN grossly intact  Good strength BUE, LLE  IMPRESSION:  41 y.o. female s/p SAH and POD#1 right VPS. Doing well.  PLAN: - Will get PMR consult - Appears more awake, will see if she takes enough PO to remove NGT, otherwise will need PEG.

## 2017-08-28 NOTE — Progress Notes (Signed)
  Speech Language Pathology Treatment: Cognitive-Linquistic  Patient Details Name: Makylee Sanborn MRN: [id number] DOB: 03/29/1976 Today's Date: 08/28/2017 Time: 8295-6213 SLP Time Calculation (min) (ACUTE ONLY): 11 min  Assessment / Plan / Recommendation Clinical Impression  Pt following commands, answering yes/no questions with head nod/shake, initiated conversation with her daughter, but getting sleepy upon my arrival after having sat in recliner for several hours.  Speech continues with low volume, requires mod verbal cues to increase volume for voicing, mod verbal cues for response time.  Pt unable to remain alert for swallowing therapy - session ceased.  Per notes, 72 hour calorie count initiated today.  SLP to follow.   HPI HPI: Pt is a 41 y/o female who presents with seizure activity. 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 ETT 9/13-9/15.  FEES 9/21 with recs for NPO.  MBS has been on hold due to poor MS.  Improving since 10/4.       SLP Plan          Recommendations  Diet recommendations: Dysphagia 2 (fine chop);Thin liquid Liquids provided via: Cup;Straw Medication Administration: Whole meds with puree Supervision: Staff to assist with self feeding;Full supervision/cueing for compensatory strategies Compensations: Slow rate;Small sips/bites;Minimize environmental distractions;Monitor for anterior loss;Follow solids with liquid Postural Changes and/or Swallow Maneuvers: Seated upright 90 degrees                Oral Care Recommendations: Oral care BID Follow up Recommendations: Inpatient Rehab SLP Visit Diagnosis: Dysphagia, oropharyngeal phase (R13.12);Attention and concentration deficit       GO                Blenda Mounts Laurice 08/28/2017, 3:55 PM

## 2017-08-28 NOTE — Progress Notes (Signed)
Calorie Count Brief Note  72 hour calorie count initiated today (08/28/17).  RD following.  RD to follow-up with recommendations.  Diet: Dysphagia 2 with thin liquids Supplements: Magic cup TID Pt receiving enteral nutrition: Vital 1.5 @ 55 ml/hr Providing: 1980 kcal, 89 grams protein, and 1008 ml free water.  Fransisca Kaufmann, MS, RDN, LDN 08/28/2017 3:42 PM

## 2017-08-28 NOTE — Progress Notes (Signed)
PULMONARY / CRITICAL CARE MEDICINE   Name: Esha Fincher MRN: [id number] DOB: October 05, 1976    ADMISSION DATE:  07/30/2017 CONSULTATION DATE:  07/30/2017  REFERRING MD:  Kathyrn Sheriff  CHIEF COMPLAINT:  SAH  HISTORY OF PRESENT ILLNESS:  41 y.o. female presented with seizures and found to have [place] and Lt frontal lobe ICH.  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 PMHx of HTN.  SUBJECTIVE:  Low appetite Shunt placed 10/11 by neurosurgery  VITAL SIGNS: BP (!) 142/104   Pulse 95   Temp 98.5 F (36.9 C) (Oral)   Resp 14   Ht _0  (1.6 m)   Wt 143 lb 4.8 oz (65 kg)   LMP  (LMP Unknown) Comment: neg preg test 07/30/17  SpO2 100%   BMI 25.38 kg/m   INTAKE / OUTPUT: I/O last 3 completed shifts: In: 3236.3 [I.V.:2058.3; Other:50; NG/GT:1127.9] Out: 3504 [Urine:2575; Drains:54; Stool:850; Blood:25]  PHYSICAL EXAMINATION: General: awake,. No distress Neuro: awake, O x 1, moves uppers weak rt, left leg limited HEENT: perr PULM: CTA CV: s1 s2 RR GI: soft, BS wnl, no r, stool loose Extremities: limited edema , no rash   LABS:  BMET  Recent Labs Lab 08/24/17 1409 08/26/17 0248 08/27/17 0323  NA 146* 142 140  K 3.7 3.6 4.0  CL 114* 107 103  CO2 21* 24 27  BUN _1 CREATININE 0.67 0.48 0.54  GLUCOSE 104* 106* 83   Electrolytes  Recent Labs Lab 08/22/17 0630 08/24/17 1409 08/26/17 0248 08/27/17 0323  CALCIUM 9.7 9.9 10.0 10.0  MG 2.2  --   --   --   PHOS 3.9  --   --   --    CBC  Recent Labs Lab 08/24/17 1409 08/27/17 0323  WBC 9.8 13.3*  HGB 9.8* 8.9*  HCT 32.0* 29.1*  PLT PLATELET CLUMPS NOTED ON SMEAR, COUNT APPEARS ADEQUATE 223   Coag's  Recent Labs Lab 08/26/17 1122  INR 1.02   Sepsis Markers No results for input(s): LATICACIDVEN, PROCALCITON, O2SATVEN in the last 168 hours.  Glucose  Recent Labs Lab 08/27/17 0741  GLUCAP 86   Imaging No results found.  STUDIES:  UDS 9/13:  Negative  CT HEAD W/O  9/13: IMPRESSION: 1. Extensive subarachnoid hemorrhage throughout the basilar cisterns, sylvian fissure and interhemispheric fissure question ruptured aneurysm. 2. Additional intra cerebral hematoma within the inferior LEFT frontal lobe 3.5 x 1.4 x 1.8 cm, un common to have intraparenchymal extension of subarachnoid hemorrhage, question concomitant or subsequent fall with traumatic hemorrhage? CT HEAD W/O 9/17: IMPRESSION: 1. Satisfactory extraventricular drainage catheter placement. Slight decrease in ventricular size, particularly temporal horns. Resolving parenchymal and subarachnoid blood. 2. No visible cortical infarction, but cannot exclude early ischemic change in the LEFT posterior limb internal capsule, as well as scattered areas of the LEFT frontal subcortical white matter. CSF 9/20:  RBC 14025 / Glucose 74 / Total Protein 43 / WBC 45 (neutrophils 88%, lymphocytes 8%, & monocytes 4%) CT HEAD W/O 9/27: IMPRESSION: 1. 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 Residual intraventricular hemorrhage. 2. 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 3. Mild leptomeningeal enhancement presumably from recent subarachnoid hemorrhage. CTA HEAD 9/27: IMPRESSION: 1. Severe vasospasm of anterior greater than posterior circulation. No emergent large vessel occlusion. 2. Status post LEFT paraophthalmic aneurysm coil embolization. 3. 3 mm RIGHT paraophthalmic aneurysm. PORT CXR 9/29:  Personally reviewed by me. Right upper extremity PICC line and change to position. No focal opacity  or effusion appreciated. Normal mediastinal contour and heart border. CT HEAD 10/07: IMPRESSION: 1. Increased hydrocephalus compared to the 08/13/2017 study. 2. Expected evolution of intraventricular blood products without evidence of new hemorrhage. 3. Unchanged appearance of inferior left frontal lobe infarct.  MICROBIOLOGY: MRSA PCR 9/13:  Negative Urine Culture  9/16:  Multiple Species Present Blood Cultures x2 9/16:  Negative  Stool C diff 9/19: Negative  CSF Culture 9/20:  Negative Urine Culture 9/20:  Negative  Urine Culture 9/25:  Negative Blood Cultures x2 9/25: Negative  Blood Cultures 10/2: neg  ANTIBIOTICS: Tressie Ellis 9/16 - 9/25 Vancomycin 9/16 - 9/25  SIGNIFICANT EVENTS: 09/13 - Admit 09/14 - Angiogram w/ coiling of left opthalmic artery aneurysm 09/19 - ID consulted for persistent fevers 09/29 - Remains on Neo-Syncephine for goal SBP. Started on albumin for vasospasm by neurosurgery. 09/30 - On Neo-Synephrine at 400. More awake & intermittently following commands. Nonsensical speech. 10/5-off alpha agents 10/11-ventriculoperitoneal shunt placement  LINES/TUBES: OETT 9/13 - 9/15 EVD 9/14 >>> RUE DL PICC 9/27 >>> L RAD ART LINE 9/28 >>> 10/1 (pt pulled) FOLEY 9/28 >>> L NGT >>> PIV  ASSESSMENT / PLAN: Day 22 post SAH. S/P opthalmic artery coiling, S/P ventriculoperitoneal shunt placement 41 y.o. female with extensive subarachnoid hemorrhage and left intracerebral hematoma/hemorrhage. Status post ophthalmic artery aneurysm coiling and ventriculoperitoneal shunt placement. 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  Vasospasm resolved Hydro worsening with clamping drain over weekend Shunt inserted, no HEAD imaging ordered following procedure P: Shunt placed 10/11 EVD open Nimodipine, re add to 28 days total with later vasospasm and remaining HTN keppra  HTN, diastolic predominant -maintain nimodiopiine as able for 7 more days Add scheduled BB  Dysphagia  P: Hold TF and assess intake tototals May need PEG Speech eval not completed 10/11 as patient in surgery  Diarrhea - C-diff negative P: Imodium, use it!!! Slightly worsening Diet, DYS 2   Hypokalemia P: Monitor bmet in am, most recent stable  Leukocytosis, high  risk drain infection with duration P:  if fever, sample drain csf Afebrile 10/12 WBC elevated 10/12  Anemia - without evidence of bleeding  - stable h/h 10/12 P: -repeated coags pre op, WNL, high risk vit K def in house for as long as she has been here  Hyperglycemia  P: Monitor glucose on BMP NICE 140-180 met  STAFF NOTE: I, Merrie Roof, MD FACP have personally reviewed patient's available data, including medical history, events of note, physical examination and test results as part of my evaluation. I have discussed with resident/NP and other care providers such as pharmacist, RN and RRT. In addition, I personally evaluated patient and elicited key findings of: awake, fc, no distress, some HTN this am , CTA, abdo soft after procedure, denies pain, had shunt placed, was neg 246 cc, Na is correcting, at this stage na 140, would kvo fluids, maintain feeds as needed, IS as able, remains with HTN and elevated diastolic pressures, I was not aware that she presented with such hypokalemia on admission, was gonna consider hctz, would avoid for now, responded to labetalol well vs hydral overall, increase labetolol to 200 bid, would also have a low threshold for diuresis which likely would help with diastolic's related to volume, follow labetalol response then add if not responding, I would agree likely needs pEG, continued feeds and add calorie count, nutrition assessment for protein supplement   Lavon Paganini. Titus Mould, MD, [place] Pgr:  [phone] [facility] 08/28/2017 9:00 AM

## 2017-08-28 NOTE — Progress Notes (Signed)
Occupational Therapy Treatment Patient Details Name: Suzanne Brooks MRN: [id number] DOB: 26-Dec-1975 Today's Date: 08/28/2017    History of present illness Pt is a 41 y/o female who presents with seizure activity. 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 PMHx: HTN, Sz   OT comments  Seen as cotreat with PT. VSS throughout.  Pt with increased alertness when sitting. Poor initiation, but more consistently following commands with increased time for processing. Pt assisted with brushing teeth and wiping out eyes once in chair. Pt singing several words to [facility]. Continue to recommend CIR when medically appropriate.   Follow Up Recommendations  CIR;Supervision/Assistance - 24 hour    Equipment Recommendations  Other (comment)    Recommendations for Other Services      Precautions / Restrictions Precautions Precautions: Fall Required Braces or Orthoses: Other Brace/Splint (B Prafo) Other Brace/Splint: bil mittens       Mobility Bed Mobility Overal bed mobility: Needs Assistance Bed Mobility: Rolling;Sidelying to Sit Rolling: Max assist Sidelying to sit: Max assist Supine to sit: Max assist     General bed mobility comments: cues for sequence with pt reaching partially with RUE to rail to roll and unable to assist significantly. Assist to bring legs off of bed and elevate trunk to sitting. Pt with assist for balance EOB mod assist due to posterior lean  Transfers Overall transfer level: Needs assistance   Transfers: Sit to/from Stand;Stand Pivot Transfers Sit to Stand: Max assist;+2 physical assistance Stand pivot transfers: Total assist;+2 physical assistance       General transfer comment: pt with cues for grasping therapists arms to stand and for anterior translation. Pt requires RLE blocked, assist for trunk anterior movement and assist to rise. Pt able to perform static standing  with partially flexed hips and cues for posture and position. x 2 trials. Total assist to pivot as pt not weight shifting or moving feet    Balance Overall balance assessment: Needs assistance Sitting-balance support: Feet supported Sitting balance-Leahy Scale: Zero Sitting balance - Comments: posterior lean with mod assist for sitting balance with cues for safety and positioning. Pt unable to correct with cues Postural control: Posterior lean Standing balance support: Bilateral upper extremity supported Standing balance-Leahy Scale: Zero Standing balance comment: not buckling L knee                           ADL either performed or assessed with clinical judgement   ADL Overall ADL's : Needs assistance/impaired Eating/Feeding: Supervision/ safety (thin liquids)   Grooming: Wash/dry face;Oral care;Moderate assistance Grooming Details (indicate cue type and reason): pt assisted with toothbrushing once toothbrush brought ot mouth; iped out eyes                             Functional mobility during ADLs: +2 for physical assistance;Maximal assistance General ADL Comments: Pt able to maintain sitting EOB for brief periods with min A. Improved ability to maintain arousal once in chair     Vision   Additional Comments: will further assess vision; poor visual attention   Perception     Praxis      Cognition Arousal/Alertness: Awake/alert Behavior During Therapy: Flat affect Overall Cognitive Status: Impaired/Different from baseline Area of Impairment: Attention;Following commands;Orientation  Orientation Level: Disoriented to;Time Current Attention Level: Focused Memory: Decreased short-term memory Following Commands: Follows one step commands inconsistently;Follows one step commands with increased time Safety/Judgement: Decreased awareness of safety;Decreased awareness of deficits Awareness: Intellectual Problem Solving: Slow  processing;Decreased initiation;Difficulty sequencing;Requires verbal cues;Requires tactile cues General Comments: pt with eyes closed initially then able to open and maintain throughout session. Pt not verbalizing much today other than to state stomach pain. Following simple commands with increased time        Exercises   Shoulder Instructions       General Comments      Pertinent Vitals/ Pain       Pain Assessment: Faces Pain Score: 4  Faces Pain Scale: Hurts little more Pain Location: stomach Pain Descriptors / Indicators: Grimacing Pain Intervention(s): Limited activity within patient's tolerance  Home Living                                          Prior Functioning/Environment              Frequency  Min 2X/week        Progress Toward Goals  OT Goals(current goals can now be found in the care plan section)  Progress towards OT goals: OT to reassess next treatment  Acute Rehab OT Goals Patient Stated Goal: none stated OT Goal Formulation: Patient unable to participate in goal setting Time For Goal Achievement: 09/04/17 Potential to Achieve Goals: Good ADL Goals Pt Will Perform Grooming: with mod assist Pt Will Perform Upper Body Bathing: with min assist;bed level Pt Will Transfer to Toilet: with min assist;stand pivot transfer;bedside commode Pt Will Perform Toileting - Clothing Manipulation and hygiene: with min assist;sit to/from stand Additional ADL Goal #1: Sit EOB x 10 min with min A as preursor to ADL Additional ADL Goal #2: Demosntrate sustained attention to ADL task with minimal redirectional cues in nondistracting envrionment  Plan Discharge plan remains appropriate    Co-evaluation    PT/OT/SLP Co-Evaluation/Treatment: Yes Reason for Co-Treatment: Complexity of the patient's impairments (multi-system involvement);For patient/therapist safety PT goals addressed during session: Mobility/safety with  mobility;Balance;Strengthening/ROM OT goals addressed during session: ADL's and self-care      AM-PAC PT "6 Clicks" Daily Activity     Outcome Measure   Help from another person eating meals?: Total Help from another person taking care of personal grooming?: Total Help from another person toileting, which includes using toliet, bedpan, or urinal?: Total Help from another person bathing (including washing, rinsing, drying)?: Total Help from another person to put on and taking off regular upper body clothing?: Total Help from another person to put on and taking off regular lower body clothing?: Total 6 Click Score: 6    End of Session Equipment Utilized During Treatment: Gait belt  OT Visit Diagnosis: Muscle weakness (generalized) (M62.81);Other abnormalities of gait and mobility (R26.89);Other symptoms and signs involving cognitive function   Activity Tolerance Patient tolerated treatment well   Patient Left in chair;with call bell/phone within reach;with chair alarm set;with restraints reapplied   Nurse Communication Mobility status;Need for lift equipment        Time: 1220-1256 OT Time Calculation (min): 36 min  Charges: OT General Charges $OT Visit: 1 Visit OT Treatments $Self Care/Home Management : 8-22 mins  [facility], OT/L  [phone] 08/28/2017   Cutler Sunday,HILLARY 08/28/2017, 1:26 PM

## 2017-08-28 NOTE — Progress Notes (Signed)
[facility] Progress Note  1 Day Post-Op  Subjective: CC: hydrocephalus Patient oriented to self and place, not situation or time. Denies abdominal pain.   Objective: Vital signs in last 24 hours: Temp:  [97.3 F (36.3 C)-98.5 F (36.9 C)] 98.5 F (36.9 C) (10/12 0800) Pulse Rate:  [78-105] 95 (10/12 0700) Resp:  [12-21] 14 (10/12 0700) BP: (105-150)/(66-107) 142/104 (10/12 0700) SpO2:  [98 %-100 %] 100 % (10/12 0700) Weight:  [65 kg (143 lb 4.8 oz)] 65 kg (143 lb 4.8 oz) (10/12 0445) Last BM Date: 08/27/17  Intake/Output from previous day: 10/11 0701 - 10/12 0700 In: 2361.3 [I.V.:1458.3; NG/GT:852.9] Out: 2553 [Urine:1775; Drains:3; Stool:750; Blood:25] Intake/Output this shift: No intake/output data recorded.  PE: Gen:  Alert, NAD, pleasant Card:  Regular rhythm, tachycardic, pedal pulses 2+ BL Pulm:  Normal effort, clear to auscultation bilaterally Abd: Soft, minimally tender around incisions, non-distended, bowel sounds present in all 4 quadrants, no HSM, incisions C/D/I Skin: warm and dry, no rashes  Psych: A&Ox2  Lab Results:   Recent Labs  08/27/17 0323  WBC 13.3*  HGB 8.9*  HCT 29.1*  PLT 223   BMET  Recent Labs  08/26/17 0248 08/27/17 0323  NA 142 140  K 3.6 4.0  CL 107 103  CO2 24 27  GLUCOSE 106* 83  BUN 18 15  CREATININE 0.48 0.54  CALCIUM 10.0 10.0   PT/INR  Recent Labs  08/26/17 1122  LABPROT 13.3  INR 1.02   CMP     Component Value Date/Time   NA 140 08/27/2017 0323   K 4.0 08/27/2017 0323   CL 103 08/27/2017 0323   CO2 27 08/27/2017 0323   GLUCOSE 83 08/27/2017 0323   BUN 15 08/27/2017 0323   CREATININE 0.54 08/27/2017 0323   CALCIUM 10.0 08/27/2017 0323   ALBUMIN 4.2 08/17/2017 0420   GFRNONAA >60 08/27/2017 0323   GFRAA >60 08/27/2017 0323   Lipase  No results found for: LIPASE     Studies/Results: No results found.  Anti-infectives: Anti-infectives    Start     Dose/Rate Route Frequency  Ordered Stop   08/27/17 1310  bacitracin 50,000 Units in sodium chloride irrigation 0.9 % 500 mL irrigation  Status:  Discontinued       As needed 08/27/17 1310 08/27/17 1336   08/08/17 1700  vancomycin (VANCOCIN) IVPB 1000 mg/200 mL premix  Status:  Discontinued     1,000 mg 200 mL/hr over 60 Minutes Intravenous Every 8 hours 08/08/17 1608 08/11/17 0929   08/06/17 0615  vancomycin (VANCOCIN) IVPB 750 mg/150 ml premix  Status:  Discontinued     750 mg 150 mL/hr over 60 Minutes Intravenous Every 8 hours 08/05/17 2217 08/08/17 1608   08/02/17 2130  vancomycin (VANCOCIN) IVPB 750 mg/150 ml premix  Status:  Discontinued     750 mg 150 mL/hr over 60 Minutes Intravenous Every 12 hours 08/02/17 0902 08/05/17 2217   08/02/17 0930  vancomycin (VANCOCIN) IVPB 1000 mg/200 mL premix     1,000 mg 200 mL/hr over 60 Minutes Intravenous  Once 08/02/17 0902 08/02/17 1116   08/02/17 0930  cefTAZidime (FORTAZ) 2 g in dextrose 5 % 50 mL IVPB  Status:  Discontinued     2 g 100 mL/hr over 30 Minutes Intravenous Every 8 hours 08/02/17 0902 08/11/17 1128       Assessment/Plan Hydrocephalus S/P laparoscopic insertion of VP shunt - 08/27/17 Dr. Derrell Lolling and Dr. Conchita Paris - POD#1 - Abdominal exam benign -  incisions C/D/I  Plan: Patient healing appropriately after laparoscopic VP shunt placement. We will sign off at this time. Please feel free to call with questions or concerns.   LOS: 29 days    Wells Guiles , [facility] Surgery 08/28/2017, 9:25 AM Pager: [phone] Consults: [phone] Mon-Fri 7:00 am-4:30 pm Sat-Sun 7:00 am-11:30 am

## 2017-08-28 NOTE — Care Management Note (Signed)
Case Management Note  Patient Details  Name: Chanette Demo MRN: [id number] Date of Birth: 14-Dec-1975  Subjective/Objective:   Pt is a 41 y/o female who presents with seizure activity. 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  PTA, pt independent of ADLs; lives with family.                    Action/Plan: PT/OT recommending CIR and consult in progress. I spoke with Ignacia Marvel, pt's daughter to discuss level of support post rehab.  Ignacia Marvel states she has 5 sisters, and she will need to discuss pt's care with them.  She states she is willing to have pt come to her home in [place], but she does not have stable transportation.  She also states she is willing to do anything to help her mother.  Encouraged daughter to discuss pt's care issues with her sisters, as this will be a issue to come up again.  She states she will do this immediately.  She appreciated my call.  Expected Discharge Date:                  Expected Discharge Plan:  IP Rehab Facility  In-House Referral:     Discharge planning Services  CM Consult  Post Acute Care Choice:    Choice offered to:     DME Arranged:    DME Agency:     HH Arranged:    HH Agency:     Status of Service:  In process, will continue to follow  If discussed at Long Length of Stay Meetings, dates discussed:    Additional Comments:  Quintella Baton, RN, BSN  Trauma/Neuro ICU Case Manager [phone]

## 2017-08-28 NOTE — Consult Note (Signed)
Physical Medicine and Rehabilitation Consult Reason for Consult: decreased functional mobility Referring Physician: Dr. Conchita Paris   HPI: Suzanne Brooks is a 41 y.o.right handed female with history of hypertension as well as reported seizures.On no prescription medications at time of admission.Presented 07/30/2017 with seizure requiring intubation.  Cranial CT scan showed diffuse subarachnoid hemorrhage. 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 A continuous EEG showed generalized polymorphic delta slowing suggesting severe encephalopathy no seizure. Patient with persistent leukocytosis as well as fever with infectious disease consulted placed on broad-spectrum antibiotics. CSF cultures negative antibiotics completed. 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 Dysphagia #2 diet. Physical therapy evaluation completed with recommendations of physical medicine rehabilitation consult.   Review of Systems  Unable to perform ROS: Acuity of condition   Past Medical History:  Diagnosis Date  . Hypertension   . Seizures (HCC)    Past Surgical History:  Procedure Laterality Date  . IR 3D INDEPENDENT WKST  07/30/2017  . IR ANGIO INTRA EXTRACRAN SEL INTERNAL CAROTID BILAT MOD SED  07/30/2017  . IR ANGIO VERTEBRAL SEL VERTEBRAL UNI R MOD SED  07/30/2017  . IR ANGIOGRAM FOLLOW UP STUDY  07/30/2017  . IR ANGIOGRAM FOLLOW UP STUDY  07/30/2017  . IR ANGIOGRAM FOLLOW UP STUDY  07/30/2017  . IR TRANSCATH/EMBOLIZ  07/30/2017  . RADIOLOGY WITH ANESTHESIA N/A 07/30/2017   Procedure: RADIOLOGY WITH ANESTHESIA/ DR. Gerlene Burdock;  Surgeon: Radiologist, Medication, MD;  Location: [facility] OR;  Service: Radiology;  Laterality: N/A;   Family History  Problem  Relation Age of Onset  . Stroke Maternal Aunt   . Aneurysm Maternal Aunt   . Aneurysm Cousin        died after aneurysm   Social History:  reports that she has never smoked. She has never used smokeless tobacco. She reports that she drinks alcohol. She reports that she uses drugs, including Marijuana. Allergies: No Known Allergies No prescriptions prior to admission.    Home: Home Living Family/patient expects to be discharged to:: Inpatient rehab Additional Comments: Pt unable to provide history, and no family present in room at the time of PT eval. Unsure what home environment is.  Functional History: Prior Function Level of Independence: Independent Comments: Presumably  Functional Status:  Mobility: Bed Mobility Overal bed mobility: Needs Assistance Bed Mobility: Rolling, Sidelying to Sit Rolling: Total assist Sidelying to sit: Max assist Supine to sit: +2 for physical assistance, Total assist, HOB elevated Sit to supine: +2 for physical assistance, Total assist General bed mobility comments: cues for sequence with pt not providing significant assist to roll. She assisted with LUE for pushing from side to sit with assist for balance in sitting as pt leaning posteriorly. Sitting EOB cues and knee blocked as pt pushing posteriorly and sliding on bed with repositioning posteriorly EOB to prevent sliding off.  Transfers Overall transfer level: Needs assistance Equipment used: None Transfer via Lift Equipment: [employer] Transfers: Sit to/from Stand, Pharmacologist Sit to Stand: Max assist, +2 physical assistance Stand pivot transfers: Max assist, +2 physical assistance General transfer comment: cues for sequence with pt support at trunk with RLE blocked and assisted to rise with max assist to control RLE and pivot pelvis to chair. Total assist +2 to scoot back in chair x 2 trials with assist of pad.  Ambulation/Gait General Gait Details: unable at  this time.      ADL: ADL Overall ADL's : Needs assistance/impaired Eating/Feeding: NPO Grooming: Wash/dry face, Maximal assistance, Sitting Grooming Details (indicate cue type and reason): hand over hand assist to wash face but then able to continue task for a few moments without assist. Lower Body Bathing: Maximal assistance, Sit to/from stand, +2 for physical assistance Lower Body Dressing: Maximal assistance Lower Body Dressing Details (indicate cue type and reason): to don socks Toileting- Clothing Manipulation and Hygiene: Maximal assistance, +2 for physical assistance, Sit to/from stand Functional mobility during ADLs: Total assistance, +2 for physical assistance General ADL Comments: Total assistance this session.   Cognition: Cognition Overall Cognitive Status: Impaired/Different from baseline Arousal/Alertness: Awake/alert Orientation Level: Oriented to person Attention: Focused, Sustained Focused Attention: Appears intact Sustained Attention: Appears intact Memory: Impaired Memory Impairment: Storage deficit, Retrieval deficit Awareness: Impaired Awareness Impairment: Intellectual impairment Problem Solving: Impaired Cognition Arousal/Alertness: Awake/alert Behavior During Therapy: Flat affect Overall Cognitive Status: Impaired/Different from baseline Area of Impairment: Attention, Following commands, Orientation Orientation Level: Disoriented to, Time Current Attention Level: Focused Memory: Decreased short-term memory Following Commands: Follows one step commands inconsistently Safety/Judgement: Decreased awareness of safety, Decreased awareness of deficits Awareness: Intellectual Problem Solving: Slow processing, Decreased initiation, Difficulty sequencing, Requires tactile cues, Requires verbal cues General Comments: pt with eyes open, able to state "May" "1977" for orientation but otherwise not responding to questions. Singing something at times, pt tracking movement  throughout session  Blood pressure (!) 142/104, pulse 95, temperature 98.5 F (36.9 C), temperature source Oral, resp. rate 14, height  (1.6 m), weight 65 kg (143 lb 4.8 oz), SpO2 100 %. Physical Exam  Constitutional:  VP craniotomy sites clean and dry  HENT:  Nasogastric tube in place  Eyes:  Pupils sluggish but reactive to light  Neck: Normal range of motion. Neck supple. No thyromegaly present.  Cardiovascular: Normal rate, regular rhythm and normal heart sounds.   Respiratory:  Limited inspiratory effort but clear to auscultation  GI: Soft. Bowel sounds are normal. She exhibits no distension.  Neurological:  Patient is very lethargic. She would squeeze hand on exam and move to painful stimulation. She did attempt to mouth a few words. Eyes are closed.   Psychiatric:  lethargic    No results found for this or any previous visit (from the past 24 hour(s)). No results found.  Assessment/Plan: Diagnosis: SAH with subsequent hydrocephalus and placement of VPS 1. Does the need for close, 24 hr/day medical supervision in concert with the patient's rehab needs make it unreasonable for this patient to be served in a less intensive setting? Yes and Potentially 2. Co-Morbidities requiring supervision/potential complications: Dysphagia, morbid obesity.  3. Due to bladder management, bowel management, safety, skin/wound care, disease management, medication administration, pain management and patient education, does the patient require 24 hr/day rehab nursing? Yes 4. Does the patient require coordinated care of a physician, rehab nurse, PT (1-2 hrs/day, 5 days/week), OT (1-2 hrs/day, 5 days/week) and SLP (1-2 hrs/day, 5 days/week) to address physical and functional deficits in the context of the above medical diagnosis(es)? Yes Addressing deficits in the following areas: balance, endurance, locomotion, strength, transferring, bowel/bladder control, bathing, dressing, feeding, grooming and  psychosocial support 5. Can the patient actively participate in an intensive therapy program of at least 3 hrs of therapy per day at least 5 days per week? Potentially 6. The potential for patient to make measurable gains while on inpatient rehab is excellent 7. Anticipated functional outcomes upon discharge from  inpatient rehab are min assist and mod assist  with PT, min assist and mod assist with OT, supervision, min assist and mod assist with SLP. 8. Estimated rehab length of stay to reach the above functional goals is: potentially 20-28 days 9. Anticipated D/C setting: Home 10. Anticipated post D/C treatments: HH therapy and Outpatient therapy 11. Overall Rehab/Functional Prognosis: excellent  RECOMMENDATIONS: This patient's condition is appropriate for continued rehabilitative care in the following setting: CIR Patient has agreed to participate in recommended program. N/A Note that insurance prior authorization may be required for reimbursement for recommended care.  Comment: Rehab Admissions Coordinator to follow up.  Thanks,  Ranelle Oyster, MD, [place] [facility]    Charlton Amor., PA-C 08/28/2017

## 2017-08-28 NOTE — Progress Notes (Signed)
Physical Therapy Treatment Patient Details Name: Suzanne Brooks MRN: [id number] DOB: 11-24-1975 Today's Date: 08/28/2017    History of Present Illness Pt is a 41 y/o female who presents with seizure activity. 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 PMHx: HTN, Sz    PT Comments    Pt with flat affect, limited verbalization today. She did nod her head to music with OT during session and report stomach pain with immediate BM after stating. Pt continues to improve with balance, standing and transfers. Pt not moving RLE and maintains flat affect throughout. Will continue to progress standing, balance and function. Pt with improved sitting balance and not pushing posterior today but continues to have a posterior lean.  VSS    Follow Up Recommendations  CIR;Supervision/Assistance - 24 hour     Equipment Recommendations  Wheelchair (measurements PT);Wheelchair cushion (measurements PT)    Recommendations for Other Services       Precautions / Restrictions Precautions Precautions: Fall Required Braces or Orthoses: Other Brace/Splint (B Prafo) Other Brace/Splint: bil mittens    Mobility  Bed Mobility Overal bed mobility: Needs Assistance Bed Mobility: Rolling;Sidelying to Sit Rolling: Max assist Sidelying to sit: Max assist       General bed mobility comments: cues for sequence with pt reaching partially with RUE to rail to roll and unable to assist significantly. Assist to bring legs off of bed and elevate trunk to sitting. Pt with assist for balance EOB mod assist due to posterior lean  Transfers Overall transfer level: Needs assistance   Transfers: Sit to/from Stand;Stand Pivot Transfers Sit to Stand: Max assist;+2 physical assistance Stand pivot transfers: Total assist;+2 physical assistance       General transfer comment: pt with cues for grasping therapists arms to stand and for  anterior translation. Pt requires RLE blocked, assist for trunk anterior movement and assist to rise. Pt able to perform static standing with partially flexed hips and cues for posture and position. x 2 trials. Total assist to pivot as pt not weight shifting or moving feet  Ambulation/Gait             General Gait Details: unable at this time.    Stairs            Wheelchair Mobility    Modified Rankin (Stroke Patients Only) Modified Rankin (Stroke Patients Only) Pre-Morbid Rankin Score: No symptoms Modified Rankin: Severe disability     Balance Overall balance assessment: Needs assistance   Sitting balance-Leahy Scale: Zero Sitting balance - Comments: posterior lean with mod assist for sitting balance with cues for safety and positioning. Pt unable to correct with cues Postural control: Posterior lean Standing balance support: Bilateral upper extremity supported Standing balance-Leahy Scale: Zero                              Cognition Arousal/Alertness: Awake/alert Behavior During Therapy: Flat affect Overall Cognitive Status: Impaired/Different from baseline                     Current Attention Level: Focused   Following Commands: Follows one step commands inconsistently;Follows one step commands with increased time       General Comments: pt with eyes closed initially then able to open and maintain throughout session. Pt not verbalizing much today other than to state stomach pain. Following simple commands with  increased time      Exercises General Exercises - Lower Extremity Long Arc Quad: AAROM;PROM;Both;5 reps;Seated (PROM RLE)    General Comments        Pertinent Vitals/Pain Pain Assessment: Faces Pain Score: 4  Faces Pain Scale: Hurts little more Pain Location: stomach Pain Descriptors / Indicators: Grimacing Pain Intervention(s): Limited activity within patient's tolerance    Home Living                       Prior Function            PT Goals (current goals can now be found in the care plan section) Progress towards PT goals: Progressing toward goals    Frequency           PT Plan Current plan remains appropriate    Co-evaluation PT/OT/SLP Co-Evaluation/Treatment: Yes Reason for Co-Treatment: Complexity of the patient's impairments (multi-system involvement);For patient/therapist safety PT goals addressed during session: Mobility/safety with mobility;Balance;Strengthening/ROM OT goals addressed during session: ADL's and self-care      AM-PAC PT "6 Clicks" Daily Activity  Outcome Measure  Difficulty turning over in bed (including adjusting bedclothes, sheets and blankets)?: Unable Difficulty moving from lying on back to sitting on the side of the bed? : Unable Difficulty sitting down on and standing up from a chair with arms (e.g., wheelchair, bedside commode, etc,.)?: Unable Help needed moving to and from a bed to chair (including a wheelchair)?: Total Help needed walking in hospital room?: Total Help needed climbing 3-5 steps with a railing? : Total 6 Click Score: 6    End of Session Equipment Utilized During Treatment: Gait belt Activity Tolerance: Patient tolerated treatment well Patient left: in chair;with call bell/phone within reach;with chair alarm set;with restraints reapplied Nurse Communication: Mobility status;Need for lift equipment;Precautions PT Visit Diagnosis: Muscle weakness (generalized) (M62.81);Other symptoms and signs involving the nervous system (R29.898);Other abnormalities of gait and mobility (R26.89)     Time: 1610-9604 PT Time Calculation (min) (ACUTE ONLY): 28 min  Charges:  $Therapeutic Activity: 8-22 mins                    G Codes:       Delaney Meigs, PT [phone]    Nishanth Mccaughan B Acelynn Dejonge 08/28/2017, 1:06 PM

## 2017-08-29 NOTE — Progress Notes (Signed)
Patient ID: Suzanne Brooks, female   DOB: 01/26/1976, 41 y.o.   MRN: [id number] Awake alert, follows commands, dressings dry, answers yes no questions with head nod, overall stable, continue current management

## 2017-08-30 NOTE — Progress Notes (Signed)
Patient ID: Suzanne Brooks, female   DOB: 31-Jul-1976, 41 y.o.   MRN: [id number] Resting quietly, no concerns overnight, dressings dry, CCM

## 2017-08-31 MED ORDER — ENSURE ENLIVE PO LIQD
237.0000 mL | Freq: Two times a day (BID) | ORAL | Status: DC
  Administered 2017-08-31 – 2017-09-01 (×3): 237 mL via ORAL

## 2017-08-31 NOTE — Progress Notes (Signed)
Nutrition Follow-up  INTERVENTION:   Ensure Enlive po BID, each supplement provides 350 kcal and 20 grams of protein  Mighty Shake II TID with meals, each supplement provides 480-500 kcals and 20-23 grams of protein  Continue via Cortrak:  Vital 1.5 @ 55 ml/hr (1320 ml/day) Provides: 1980 kcal, 89 grams protein, and 1008 ml free water.  NUTRITION DIAGNOSIS:   Inadequate oral intake related to dysphagia as evidenced by meal completion < 50%. Ongoing.   GOAL:   Patient will meet greater than or equal to 90% of their needs Met.   MONITOR:   PO intake, TF tolerance, Skin, I & O's  REASON FOR ASSESSMENT:   Consult Calorie Count  ASSESSMENT:   41 year old female who presented to ED with seizures found to be hypertensive with aneurysmal SAH, ICH. Coiled in IR 9/13. Post-op to ICU on vent.   Pt discussed during ICU rounds and with RN.  Calorie Count ordered 10/12, no documentation found. Per RN pt will state she does not want to eat if asked. To get her to eat RN has to offer food and then intake is usually poor. Pt does better with liquids than foods Discussed calorie count documentation with RN Per RN pt drank her ensure and OJ this am with only bites of egg off plate: 420 kcal, and 20 grams protein Pt may need PEG for consistent nutrition  Medications reviewed and include: MVI, KCl Labs reviewed  Cortrak with Vital 1.5 @ 55 ml/hr Provides: 1980 kcal, 89 grams protein, and 1008 ml free water.  Diet Order:  DIET DYS 2 Room service appropriate? Yes; Fluid consistency: Thin  Skin:   (MASD buttocks)  Last BM:  10/14 700 ml via rectal tube  Height:   Ht Readings from Last 1 Encounters:  07/30/17 _0  (1.6 m)    Weight:   Wt Readings from Last 1 Encounters:  08/31/17 133 lb 13.1 oz (60.7 kg)    Ideal Body Weight:  52.2 kg  BMI:  Body mass index is 23.71 kg/m.  Estimated Nutritional Needs:   Kcal:  1880-2070 (30-33 kcal/kg)  Protein:  82-94 grams (1.3-1.5  grams/kg)  Fluid:  > 1.5 L/day  EDUCATION NEEDS:   No education needs identified at this time  [place], [place], [place] Pager [phone] After Hours Pager

## 2017-08-31 NOTE — Progress Notes (Signed)
  Speech Language Pathology Treatment: Dysphagia;Cognitive-Linquistic  Patient Details Name: Suzanne Brooks MRN: [id number] DOB: [birth year] Today's Date: 08/31/2017 Time: 5643-3295 SLP Time Calculation (min) (ACUTE ONLY): 31 min  Assessment / Plan / Recommendation Clinical Impression  Treatment focused on utilization of functional ADL (self feeding am meal) to facilitate progression with both cognitive-linguistic and dysphagia goals. Patient with minimal verbal output today, low vocal intensity, despite max questioning cues/verbal encouragement to participate in conversation. Patient able to sustain attention to self feeding task with moderate-max cues for self feeding of solids, supervision-min verbal cueing for liquids. Poor initiation appeared to be primary contributing factor today as patient willingly consumed additional pos with physical assistance from SLP. Patient consumed approximately 20 ounces of thin liquid via self feeding and approximately 12 ounces of solids without overt s/s of aspiration. Oral phase prolonged but functional for continued intake of dysphagia 2 solids. Education complete with patient regarding importance of increasing po intake to avoid long term non-oral means of nutrition and d/c NG tube.   Patient appears to prefer liquids over solids. Drank entire bottle of Ensure without complaints. Question if Ensure can be consistently added to meal tray. Paged dietician without return call.     HPI HPI: Pt is a 41 y/o female who presents with seizure activity. 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 ETT 9/13-9/15.  FEES 9/21 with recs for NPO.  MBS has been on hold due to poor MS.  Improving since 10/4.       SLP Plan  Continue with current plan of care       Recommendations  Diet recommendations: Dysphagia 2 (fine chop);Thin liquid Liquids provided via: Cup;Straw Medication Administration:  Whole meds with puree Supervision: Staff to assist with self feeding;Full supervision/cueing for compensatory strategies Compensations: Slow rate;Small sips/bites;Minimize environmental distractions;Monitor for anterior loss;Follow solids with liquid Postural Changes and/or Swallow Maneuvers: Seated upright 90 degrees                Oral Care Recommendations: Oral care BID Follow up Recommendations: Inpatient Rehab SLP Visit Diagnosis: Dysphagia, oropharyngeal phase (R13.12);Cognitive communication deficit (R41.841) Attention and concentration deficit following: Nontraumatic SAH Plan: Continue with current plan of care       GO          Ferdinand Lango MA, CCC-SLP [phone]       [name] 08/31/2017, 10:22 AM

## 2017-08-31 NOTE — H&P (Signed)
  Physical Medicine and Rehabilitation Admission H&P       Chief Complaint  Patient presents with  . Seizures  : HPI: Suzanne Brooksis a 41 y.o.right handed femalewith history of hypertension as well as reported seizures.On no prescription medications at time of admission. Per report patient independent prior to admission multiple family in the area with daughter planning to provide assistance.Presented 07/30/2017 with seizure requiring intubation. Cranial CT scan showed diffuse subarachnoid hemorrhage. Underwent diagnostic angiogram with coiling's of findings a left ophthalmic aneurysm per Dr. Nundkumar. 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 A continuous EEG showed generalized polymorphic delta slowing suggesting severe encephalopathy no seizure.Maintained on Keppra for seizure prophylaxis. Patient with persistent leukocytosis as well as fever with infectious disease consulted placed on broad-spectrum antibiotics. CSF cultures negative antibiotics completed. 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 Dysphagia #2 diet and thin liquid diet as well as nasogastric tube feeds for nutritional support due to lack of appetite. Physical and occupationaltherapy evaluations completed with recommendations of physical medicine rehabilitation consult.patient was admitted for a comprehensive rehabilitation program  Review of Systems  Unable to perform ROS: Acuity of condition       Past Medical History:  Diagnosis Date  . Hypertension   . Seizures (HCC)         Past Surgical History:  Procedure Laterality Date  . IR 3D INDEPENDENT WKST  07/30/2017  . IR ANGIO INTRA EXTRACRAN SEL INTERNAL CAROTID BILAT MOD SED  07/30/2017  . IR ANGIO VERTEBRAL SEL VERTEBRAL UNI R MOD SED  07/30/2017  . IR ANGIOGRAM FOLLOW UP STUDY  07/30/2017  . IR  ANGIOGRAM FOLLOW UP STUDY  07/30/2017  . IR ANGIOGRAM FOLLOW UP STUDY  07/30/2017  . IR TRANSCATH/EMBOLIZ  07/30/2017  . LAPAROSCOPIC REVISION VENTRICULAR-PERITONEAL (V-P) SHUNT N/A 08/27/2017   Procedure: LAPAROSCOPIC REVISION VENTRICULAR-PERITONEAL (V-P) SHUNT;  Surgeon: Ramirez, Armando, MD;  Location: [facility] OR;  Service: General;  Laterality: N/A;  . RADIOLOGY WITH ANESTHESIA N/A 07/30/2017   Procedure: RADIOLOGY WITH ANESTHESIA/ DR. NUNDKUMAR/IR;  Surgeon: Radiologist, Medication, MD;  Location: [facility] OR;  Service: Radiology;  Laterality: N/A;  . VENTRICULOPERITONEAL SHUNT N/A 08/27/2017   Procedure: SHUNT INSERTION VENTRICULAR-PERITONEAL;  Surgeon: Nundkumar, Neelesh, MD;  Location: [facility] OR;  Service: Neurosurgery;  Laterality: N/A;        Family History  Problem Relation Age of Onset  . Stroke Maternal Aunt   . Aneurysm Maternal Aunt   . Aneurysm Cousin        died after aneurysm   Social History:  reports that she has never smoked. She has never used smokeless tobacco. She reports that she drinks alcohol. She reports that she uses drugs, including Marijuana. Allergies: No Known Allergies No prescriptions prior to admission.    Drug Regimen Review Drug regimen was reviewed and remains appropriate with no significant issues identified  Home: Home Living Family/patient expects to be discharged to:: Inpatient rehab Additional Comments: Pt unable to provide history, and no family present in room at the time of PT eval. Unsure what home environment is.   Functional History: Prior Function Level of Independence: Independent Comments: Presumably   Functional Status:  Mobility: Bed Mobility Overal bed mobility: Needs Assistance Bed Mobility: Rolling, Sidelying to Sit Rolling: Max assist Sidelying to sit: Max assist Supine to sit: Max assist Sit to supine: +2 for physical assistance, Total assist General bed mobility comments: cues for sequence with pt reaching  partially   with RUE to rail to roll and unable to assist significantly. Assist to bring legs off of bed and elevate trunk to sitting. Pt with assist for balance EOB mod assist due to posterior lean Transfers Overall transfer level: Needs assistance Equipment used: None Transfer via Lift Equipment: [place] Transfers: Sit to/from Stand, Stand Pivot Transfers Sit to Stand: Max assist, +2 physical assistance Stand pivot transfers: Total assist, +2 physical assistance General transfer comment: pt with cues for grasping therapists arms to stand and for anterior translation. Pt requires RLE blocked, assist for trunk anterior movement and assist to rise. Pt able to perform static standing with partially flexed hips and cues for posture and position. x 2 trials. Total assist to pivot as pt not weight shifting or moving feet Ambulation/Gait General Gait Details: unable at this time.   ADL: ADL Overall ADL's : Needs assistance/impaired Eating/Feeding: Supervision/ safety (thin liquids) Grooming: Wash/dry face, Oral care, Moderate assistance Grooming Details (indicate cue type and reason): pt assisted with toothbrushing once toothbrush brought ot mouth; iped out eyes Lower Body Bathing: Maximal assistance, Sit to/from stand, +2 for physical assistance Lower Body Dressing: Maximal assistance Lower Body Dressing Details (indicate cue type and reason): to don socks Toileting- Clothing Manipulation and Hygiene: Maximal assistance, +2 for physical assistance, Sit to/from stand Functional mobility during ADLs: +2 for physical assistance, Maximal assistance General ADL Comments: Pt able to maintain sitting EOB for brief periods with min A. Improved ability to maintain arousal once in chair  Cognition: Cognition Overall Cognitive Status: Impaired/Different from baseline Arousal/Alertness: Awake/alert Orientation Level: Oriented to person Attention: Focused, Sustained Focused Attention: Appears  intact Sustained Attention: Appears intact Memory: Impaired Memory Impairment: Storage deficit, Retrieval deficit Awareness: Impaired Awareness Impairment: Intellectual impairment Problem Solving: Impaired Cognition Arousal/Alertness: Awake/alert Behavior During Therapy: Flat affect Overall Cognitive Status: Impaired/Different from baseline Area of Impairment: Attention, Following commands, Orientation Orientation Level: Disoriented to, Time Current Attention Level: Focused Memory: Decreased short-term memory Following Commands: Follows one step commands inconsistently, Follows one step commands with increased time Safety/Judgement: Decreased awareness of safety, Decreased awareness of deficits Awareness: Intellectual Problem Solving: Slow processing, Decreased initiation, Difficulty sequencing, Requires verbal cues, Requires tactile cues General Comments: pt with eyes closed initially then able to open and maintain throughout session. Pt not verbalizing much today other than to state stomach pain. Following simple commands with increased time  Physical Exam: Blood pressure (!) 127/110, pulse 95, temperature 98.2 F (36.8 C), temperature source Axillary, resp. rate 18, height 5' 3" (1.6 m), weight 60.7 kg (133 lb 13.1 oz), SpO2 100 %. Physical Exam  Vitals reviewed. Constitutional:  Sitting in chair with bilateral mittens  HENT:  VP craniotomy site clean and dry. Nasogastric tube in place  Eyes:  Pupils sluggish but reactive to light  Neck: Normal range of motion. Neck supple. No thyromegaly present.  Cardiovascular: Normal rate, regular rhythm and normal heart sounds.  Exam reveals no friction rub.   No murmur heard. Respiratory: No respiratory distress. She has no wheezes. She has no rales.  Limited inspiratory effort but clear to auscultation  GI: Soft. Bowel sounds are normal. She exhibits no distension.  Musculoskeletal: She exhibits no edema or tenderness.  Psychiatric:   Affect flat, disengaged  Skin. Warm and dry Neurological. Patient is alert and makes eye contact with examiner. Can track to left and right but is limited in pursuit. Can protrude tongue.. She did squeeze my hand on exam and moved to painful stimuli. She did attempt to mouth   a few words. She does answer simple yes and no questions. She will easily fall back asleep.       Medical Problem List and Plan: 1.  Decreased functional mobility and cognitive deficits secondary to SAH with subsequent hydrocephalus and placement of the VPS             -admit to inpatient rehab 2.  DVT Prophylaxis/Anticoagulation: SCDs 3. Pain Management: Tylenol as needed 4. Mood:  Provide emotional support 5. Neuropsych: This patient is not capable of making decisions on her own behalf.             -mittens to prevent disruption of equipment             -fall safety precautions 6. Skin/Wound Care:  Routine skin checks 7. Fluids/Electrolytes/Nutrition: Routine I&O with follow-up chemistries 8.Seizure disorder.Keppra 1000 mg twice a day 9.Dysphagia. Dysphagia #2 thin liquids/nasogastric tube feeds to supplement---remove when able to adequately meet nutritional needs--follow daily I's and O's. 10.Hypertension.Labetaolol 200 mg BID             -DBP elevated particularly.              -check bp's q shift and with therapy.              -titrate regimen as needed 11.Leukocytosis. Improved. CSF negative. Infectious disease follow-up as needed. All antibiotics have since been discontinued   Post Admission Physician Evaluation: 1. Functional deficits secondary  to SAH with subsequent hydrocephalus/placement of VPS. 2. Patient is admitted to receive collaborative, interdisciplinary care between the physiatrist, rehab nursing staff, and therapy team. 3. Patient's level of medical complexity and substantial therapy needs in context of that medical necessity cannot be provided at a lesser intensity of care such as a  SNF. 4. Patient has experienced substantial functional loss from his/her baseline which was documented above under the "Functional History" and "Functional Status" headings.  Judging by the patient's diagnosis, physical exam, and functional history, the patient has potential for functional progress which will result in measurable gains while on inpatient rehab.  These gains will be of substantial and practical use upon discharge  in facilitating mobility and self-care at the household level. 5. Physiatrist will provide 24 hour management of medical needs as well as oversight of the therapy plan/treatment and provide guidance as appropriate regarding the interaction of the two. 6. The Preadmission Screening has been reviewed and patient status is unchanged unless otherwise stated above. 7. 24 hour rehab nursing will assist with bladder management, bowel management, safety, skin/wound care, disease management, medication administration, pain management and patient education  and help integrate therapy concepts, techniques,education, etc. 8. PT will assess and treat for/with: Lower extremity strength, range of motion, stamina, balance, functional mobility, safety, adaptive techniques and equipment, NMR, cognitive perceptual deficits, family education.   Goals are: min to mod assist. 9. OT will assess and treat for/with: ADL's, functional mobility, safety, upper extremity strength, adaptive techniques and equipment , NMR, family education, cognitive perceptual deficits.   Goals are: min to mod assist. Therapy may not yet proceed with showering this patient. 10. SLP will assess and treat for/with: cognition, swallowing, communication, famiy ed.  Goals are: supervision to min/mod assist. 11. Case Management and Social Worker will assess and treat for psychological issues and discharge planning. 12. Team conference will be held weekly to assess progress toward goals and to determine barriers to  discharge. 13. Patient will receive at least 3 hours of therapy per day at least

## 2017-08-31 NOTE — Progress Notes (Signed)
Rehab admissions - I am following for potential acute inpatient rehab admission.  Noted patient continues on nimodipine.  I will contact family to discuss rehab and potential discharge plans after rehab.  Patient is awake but she cannot really converse with me.  I will begin calling her daughters to discuss rehab options.  Call me for questions.  #[phone]

## 2017-08-31 NOTE — Progress Notes (Signed)
Rehab admissions - I spoke with daughter, Ignacia Marvel, and she is willing to take patient home after CIR stay.  Daughter has a 3 bedroom apartment and can care for patient.  Daughter does not work, but she has 2 children.  Daughter will get help from her sister and from friends after patient discharged home.  I spoke with financial counselor who states that patient has full medicaid.  I will plan to admit over the next 1-2 days as a bed becomes available on rehab.  Daughter is in agreement.  I will have one of my partners follow up in am for bed availability.  Call me for questions.  #[phone]

## 2017-08-31 NOTE — Progress Notes (Signed)
PULMONARY / CRITICAL CARE MEDICINE   Name: Suzanne Brooks MRN: [id number] DOB: 1976-08-05    ADMISSION DATE:  07/30/2017 CONSULTATION DATE:  07/30/2017  REFERRING MD:  Conchita Paris  CHIEF COMPLAINT:  SAH  HISTORY OF PRESENT ILLNESS:  41 y.o. female with Hx of HTN, presented with seizures and found to have SAH and Lt frontal lobe ICH.  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   SUBJECTIVE:  Low appetite but does tolerate dysphagia diet Shunt placed 10/11 by neurosurgery  VITAL SIGNS: BP (!) 131/100   Pulse (!) 102   Temp 98.9 F (37.2 C) (Oral)   Resp 15   Ht  (1.6 m)   Wt 60.7 kg (133 lb 13.1 oz)   LMP  (LMP Unknown) Comment: neg preg test 07/30/17  SpO2 98%   BMI 23.71 kg/m   INTAKE / OUTPUT: I/O last 3 completed shifts: In: 2275 [I.V.:350; NG/GT:1925] Out: 2275 [Urine:1450; Stool:825]  PHYSICAL EXAMINATION: General: somnolent, awakens to touch, NAD Neuro: moves uppers weak rt, left leg limited HEENT: perrL, NG tube in place, OP clear; MM moist PULM: CTA b/l CV: s1 s2 RR GI: soft, BS wnl, no r, stool loose Extremities: limited edema , no rash   LABS:  BMET  Recent Labs Lab 08/24/17 1409 08/26/17 0248 08/27/17 0323  NA 146* 142 140  K 3.7 3.6 4.0  CL 114* 107 103  CO2 21* 24 27  BUN CREATININE 0.67 0.48 0.54  GLUCOSE 104* 106* 83   Electrolytes  Recent Labs Lab 08/24/17 1409 08/26/17 0248 08/27/17 0323  CALCIUM 9.9 10.0 10.0   CBC  Recent Labs Lab 08/24/17 1409 08/27/17 0323  WBC 9.8 13.3*  HGB 9.8* 8.9*  HCT 32.0* 29.1*  PLT PLATELET CLUMPS NOTED ON SMEAR, COUNT APPEARS ADEQUATE 223   Coag's  Recent Labs Lab 08/26/17 1122  INR 1.02   Sepsis Markers No results for input(s): LATICACIDVEN, PROCALCITON, O2SATVEN in the last 168 hours.  Glucose  Recent Labs Lab 08/27/17 0741  GLUCAP 86   Imaging No results found.  STUDIES:  UDS 9/13:  Negative  CT HEAD W/O 9/13: IMPRESSION: 1. Extensive  subarachnoid hemorrhage throughout the basilar cisterns, sylvian fissure and interhemispheric fissure question ruptured aneurysm. 2. Additional intra cerebral hematoma within the inferior LEFT frontal lobe 3.5 x 1.4 x 1.8 cm, un common to have intraparenchymal extension of subarachnoid hemorrhage, question concomitant or subsequent fall with traumatic hemorrhage? CT HEAD W/O 9/17: IMPRESSION: 1. Satisfactory extraventricular drainage catheter placement. Slight decrease in ventricular size, particularly temporal horns. Resolving parenchymal and subarachnoid blood. 2. No visible cortical infarction, but cannot exclude early ischemic change in the LEFT posterior limb internal capsule, as well as scattered areas of the LEFT frontal subcortical white matter. CSF 9/20:  RBC 14025 / Glucose 74 / Total Protein 43 / WBC 45 (neutrophils 88%, lymphocytes 8%, & monocytes 4%) CT HEAD W/O 9/27: IMPRESSION: 1. 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 Residual intraventricular hemorrhage. 2. 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 3. Mild leptomeningeal enhancement presumably from recent subarachnoid hemorrhage. CTA HEAD 9/27: IMPRESSION: 1. Severe vasospasm of anterior greater than posterior circulation. No emergent large vessel occlusion. 2. Status post LEFT paraophthalmic aneurysm coil embolization. 3. 3 mm RIGHT paraophthalmic aneurysm. PORT CXR 9/29:  Personally reviewed by me. Right upper extremity PICC line and change to position. No focal opacity or effusion appreciated. Normal mediastinal contour and heart border. CT HEAD 10/07: IMPRESSION: 1. Increased  hydrocephalus compared to the 08/13/2017 study. 2. Expected evolution of intraventricular blood products without evidence of new hemorrhage. 3. Unchanged appearance of inferior left frontal lobe infarct.  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 Started on albumin for vasospasm by neurosurgery. 09/30 - On Neo-Synephrine at 400. More awake & intermittently following commands. Nonsensical speech. 10/5-off alpha agents 10/11-ventriculoperitoneal shunt placement  LINES/TUBES: OETT 9/13 - 9/15 EVD 9/14 >>> RUE DL PICC 1/61 >>> L RAD ART LINE 9/28 >>> 10/1 (pt pulled) FOLEY 9/28 >>> L NGT >>> PIV  ASSESSMENT / PLAN:  41 y.o. 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  Vasospasm resolved Shunt placed 10/11 Nimodipine and keppra Will add IS and Flutter PT/OT following ICU consult will now sign off  HTN, diastolic predominant -maintain nimodiopiine - continue labetalol  Dysphagia  - tolerated dysphagia 2 diet with thin liquids per speech, however appetite is very poor overall.  - continues tube feeds.  - May need PEG if PO intake doesn't improve  Diarrhea - C-diff negative - likely related to tube feeds; afebrile - continue immodium PRN  Anemia - without evidence of bleeding  - stable h/h when last checked 10/11   25 minutes spent on this consult  Milana Obey, MD [facility] 08/31/2017 10:59 AM

## 2017-08-31 NOTE — PMR Pre-admission (Signed)
PMR Admission Coordinator Pre-Admission Assessment  Patient: Suzanne Brooks is an 41 y.o., female MRN: [id number] DOB: [birth year] Height:  (160 cm) Weight: 60.7 kg (133 lb 13.1 oz)             Insurance Information HMO:     PPO:       PCP:       IPA:       80/20:       OTHER:   PRIMARY:  Medicaid [place] access      Policy#: [id number]      Subscriber: Enrigue Catena CM Name:        Phone#:       Fax#:   Pre-Cert#:        Employer: Works PT Benefits:  Phone #: [phone]     Name:  Automated Eff. Date: Eligible 08/31/17     Deduct:        Out of Pocket Max:        Life Max:   CIR:        SNF:   Outpatient:       Co-Pay:   Home Health:        Co-Pay:   DME:       Co-Pay:   Providers:    Medicaid Application Date:        Case Manager:   Disability Application Date:        Case Worker:    Emergency Contact Information Contact Information    Name Relation Home Work Mobile   [place] Daughter   [phone]   [facility] Daughter   [phone]   Marcianne, [place] Daughter   [phone]   Clarita Leber Mother   [phone]   Vergie Living Significant other   [phone]     Current Medical History  Patient Admitting Diagnosis: 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 On no prescription medications at time of admission. Per report patient independent prior to admission multiple family in the area questioned assistance on discharge. Presented 07/30/2017 with seizure requiring intubation. Cranial CT scan showed diffuse subarachnoid hemorrhage. 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 A continuous EEG showed generalized polymorphic delta slowing suggesting severe encephalopathy no  seizure.Maintained on Keppra for seizure prophylaxis. Patient with persistent leukocytosis as well as fever with infectious disease consulted placed on broad-spectrum antibiotics. CSF cultures negative antibiotics completed. 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 Dysphagia #2 diet. Physical and occupational therapy evaluations completed with recommendations of physical medicine rehabilitation consult.  Patient to be admitted for a comprehensive inpatient rehabilitation program.  Total: 7=NIH  Past Medical History  Past Medical History:  Diagnosis Date  . Hypertension   . Seizures (HCC)     Family History  family history includes Aneurysm in her cousin and maternal aunt; Stroke in her maternal aunt.  Prior Rehab/Hospitalizations: No previous rehab.  Has the patient had major surgery during 100 days prior to admission? No  Current Medications   Current Facility-Administered Medications:  .  0.45 % sodium chloride infusion, , Intravenous, Continuous, Harbrecht, Lawrence, MD, Last Rate: 10 mL/hr at 09/01/17 0700 .  [DISCONTINUED] acetaminophen (TYLENOL) tablet 650 mg, 650 mg, Oral, Q4H PRN **OR** acetaminophen (TYLENOL) solution 650 mg, 650 mg, Per Tube, Q4H PRN, 650 mg at 08/27/17 2023 **  OR** acetaminophen (TYLENOL) suppository 650 mg, 650 mg, Rectal, Q4H PRN, Lisbeth Renshaw, MD, 650 mg at 08/06/17 1053 .  calamine lotion, , Topical, PRN, Tobey Grim, NP, 1 application at 08/25/17 2253 .  chlorhexidine (PERIDEX) 0.12 % solution 15 mL, 15 mL, Mouth Rinse, BID, Lisbeth Renshaw, MD, 15 mL at 09/01/17 0800 .  feeding supplement (ENSURE ENLIVE) (ENSURE ENLIVE) liquid 237 mL, 237 mL, Oral, BID BM, Lisbeth Renshaw, MD, 237 mL at 09/01/17 0901 .  feeding supplement (VITAL 1.5 CAL) liquid 1,000 mL, 1,000 mL, Per Tube, Continuous, Lisbeth Renshaw, MD, Last Rate: 55 mL/hr at 09/01/17 0700, 1,000  mL at 09/01/17 0700 .  fentaNYL (SUBLIMAZE) injection 12.5-25 mcg, 12.5-25 mcg, Intravenous, Q2H PRN, Max Fickle B, MD, 25 mcg at 08/13/17 0436 .  hydrALAZINE (APRESOLINE) injection 10 mg, 10 mg, Intravenous, Q4H PRN, Ginger Carne, MD, 10 mg at 08/26/17 1220 .  ibuprofen (ADVIL,MOTRIN) 100 MG/5ML suspension 600 mg, 600 mg, Per Tube, Q6H PRN, Lisbeth Renshaw, MD, 600 mg at 08/23/17 2214 .  labetalol (NORMODYNE) tablet 200 mg, 200 mg, Oral, BID, Harbrecht, Lawrence, MD, 200 mg at 09/01/17 0900 .  labetalol (NORMODYNE,TRANDATE) injection 20 mg, 20 mg, Intravenous, Q10 min PRN, Whiteheart, Kathryn A, NP, 20 mg at 08/26/17 1610 .  levETIRAcetam (KEPPRA) 100 MG/ML solution 1,000 mg, 1,000 mg, Per Tube, BID, Coralyn Helling, MD, 1,000 mg at 09/01/17 0900 .  loperamide (IMODIUM) 1 MG/5ML solution 2 mg, 2 mg, Per Tube, PRN, Lisbeth Renshaw, MD, 2 mg at 08/28/17 1754 .  MEDLINE mouth rinse, 15 mL, Mouth Rinse, q12n4p, Lisbeth Renshaw, MD, 15 mL at 09/01/17 0901 .  metoprolol tartrate (LOPRESSOR) injection 2.5-5 mg, 2.5-5 mg, Intravenous, Q3H PRN, Whiteheart, Kathryn A, NP, 5 mg at 08/25/17 0533 .  multivitamin liquid 15 mL, 15 mL, Per Tube, Daily, Ramaswamy, Murali, MD, 15 mL at 09/01/17 0900 .  nystatin (MYCOSTATIN) 100000 UNIT/ML suspension 500,000 Units, 5 mL, Oral, QID, Tobey Grim, NP, 500,000 Units at 09/01/17 0901 .  ondansetron (ZOFRAN-ODT) disintegrating tablet 4 mg, 4 mg, Oral, Q6H PRN **OR** ondansetron (ZOFRAN) injection 4 mg, 4 mg, Intravenous, Q6H PRN, Lisbeth Renshaw, MD .  potassium chloride (KLOR-CON) packet 20 mEq, 20 mEq, Oral, Daily, Lynnell Jude, MD, 20 mEq at 09/01/17 0900 .  ranitidine (ZANTAC) 150 MG/10ML syrup 150 mg, 150 mg, Per Tube, BID, Scarlett Presto, RPH, 150 mg at 09/01/17 0901  Patients Current Diet: DIET DYS 2 Room service appropriate? Yes; Fluid consistency: Thin  Precautions / Restrictions Precautions Precautions: Fall Precaution Comments:  clamp EVD prior to moving the bed/mobilizing pt.  Other Brace/Splint: bil mittens Restrictions Weight Bearing Restrictions: No   Has the patient had 2 or more falls or a fall with injury in the past year?No  Prior Activity Level Community (5-7x/wk): Went out daily.  worked PT as a Investment banker, operational, was driving.  Home Assistive Devices / Equipment Home Assistive Devices/Equipment: None  Prior Device Use: Indicate devices/aids used by the patient prior to current illness, exacerbation or injury? None  Prior Functional Level Prior Function Level of Independence: Independent Comments: Presumably   Self Care: Did the patient need help bathing, dressing, using the toilet or eating?   Independent  Indoor Mobility: Did the patient need assistance with walking from room to room (with or without device)? Independent  Stairs: Did the patient need assistance with internal or external stairs (with or without device)? Independent  Functional Cognition: Did the patient need help planning regular tasks such as shopping  or remembering to take medications? Independent  Current Functional Level Cognition  Arousal/Alertness: Awake/alert Overall Cognitive Status: Impaired/Different from baseline Current Attention Level: Sustained Orientation Level: Oriented to person, Disoriented to place, Disoriented to time, Disoriented to situation Following Commands: Follows one step commands inconsistently, Follows one step commands with increased time Safety/Judgement: Decreased awareness of safety, Decreased awareness of deficits General Comments: pt with eyes open the whole session; the only attempt to verbalize was mouthing goodbye once asked her to say it; when I asked her to give me 5 she did and then when I immediately asked her to give the tech 5 (she counted out 5 fingers) both with her left hand Attention: Focused, Sustained Focused Attention: Appears intact Sustained Attention: Appears intact Memory:  Impaired Memory Impairment: Storage deficit, Retrieval deficit Awareness: Impaired Awareness Impairment: Intellectual impairment Problem Solving: Impaired    Extremity Assessment (includes Sensation/Coordination)  Upper Extremity Assessment: Difficult to assess due to impaired cognition (limited RUE active movement noted)  Lower Extremity Assessment: Defer to PT evaluation    ADLs  Overall ADL's : Needs assistance/impaired Eating/Feeding: Supervision/ safety (thin liquids) Grooming: Wash/dry face Grooming Details (indicate cue type and reason): once washcloth placed in pt's LUE she was able to wash her eyes, wash her forehead and her left ear with cues for each part to wash; for RUE once washcloth placed and I helped her raise her left hand to her left ear, she washed her left ear Lower Body Bathing: Maximal assistance, Sit to/from stand, +2 for physical assistance Lower Body Dressing: Maximal assistance Lower Body Dressing Details (indicate cue type and reason): to don socks Toileting- Clothing Manipulation and Hygiene: Maximal assistance, +2 for physical assistance, Sit to/from stand Functional mobility during ADLs: +2 for physical assistance, Maximal assistance General ADL Comments: Pt able to maintain sitting EOB for brief periods with min A. Improved ability to maintain arousal once in chair    Mobility  Overal bed mobility: Needs Assistance Bed Mobility: Supine to Sit Rolling: Max assist Sidelying to sit: Max assist Supine to sit: Total assist, +2 for physical assistance Sit to supine: +2 for physical assistance, Total assist General bed mobility comments: cues for sequence with pt reaching partially with RUE to rail to roll and unable to assist significantly. Assist to bring legs off of bed and elevate trunk to sitting. Pt with assist for balance EOB mod assist due to posterior lean    Transfers  Overall transfer level: Needs assistance Equipment used: None Transfer via Lift  Equipment: [place] Transfers: Systems analyst Sit to Stand: Max assist, +2 physical assistance Stand pivot transfers: +2 physical assistance, Total assist General transfer comment: using bed pad under pt and gait belt for transfer (pt with tendency to lean posteriorly and left)    Ambulation / Gait / Stairs / Wheelchair Mobility  Ambulation/Gait General Gait Details: unable at this time.     Posture / Balance Dynamic Sitting Balance Sitting balance - Comments: pt with variable balance from min A to max A Balance Overall balance assessment: Needs assistance Sitting-balance support: Feet supported, Bilateral upper extremity supported Sitting balance-Leahy Scale: Zero Sitting balance - Comments: pt with variable balance from min A to max A Postural control: Posterior lean, Left lateral lean Standing balance support: Bilateral upper extremity supported Standing balance-Leahy Scale: Zero Standing balance comment: not buckling L knee    Special needs/care consideration BiPAP/CPAP No CPM No Continuous Drip IV KVO  Dialysis No        Life Vest  No Oxygen No Special Bed No Trach Size No Wound Vac (area) No       Skin Post op incisions right head and abdomen.                               Bowel mgmt: Last BM 08/31/17 Bladder mgmt: External catheter Diabetic mgmt No    Previous Home Environment Home Care Services: No Additional Comments: Pt unable to provide history, and no family present in room at the time of PT eval. Unsure what home environment is.  Discharge Living Setting Plans for Discharge Living Setting: Lives with (comment), Apartment (Dtr plans home to her apartment.) Type of Home at Discharge: Apartment Discharge Home Layout: Two level, Bed/bath upstairs Alternate Level Stairs-Number of Steps: 14 to upper level Discharge Home Access: Stairs to enter Entrance Stairs-Number of Steps: 4 step entry Does the patient have any problems obtaining your medications?: Yes  (Describe)  Social/Family/Support Systems Patient Roles: Parent, Other (Comment) (Has 3 daughters, a son and a mom.  Had a boyfriend as well.) Contact Information: Nikolette Reindl - daughter Anticipated Caregiver: Daughter Anticipated Caregiver's Contact Information: Ignacia Marvel - daughter - [phone] Ability/Limitations of Caregiver: Daughter does not work.  Has children, can assist. Caregiver Availability: 24/7 Discharge Plan Discussed with Primary Caregiver: Yes Is Caregiver In Agreement with Plan?: Yes Does Caregiver/Family have Issues with Lodging/Transportation while Pt is in Rehab?: No  Goals/Additional Needs Patient/Family Goal for Rehab: PT/OT min to mod assist, SLP supervision to min to mod assist goals Expected length of stay: 20-28 days Cultural Considerations: None Dietary Needs: Dysphagia 2, thin liquids with supplemental tube feedings per nasogastric tube feeding. Equipment Needs: TBD Special Service Needs: May need PEG placed if appetite does not improve Pt/Family Agrees to Admission and willing to participate: Yes Program Orientation Provided & Reviewed with Pt/Caregiver Including Roles  & Responsibilities: Yes  Decrease burden of Care through IP rehab admission: N/A  Possible need for SNF placement upon discharge: Not planned  Patient Condition: This patient's medical and functional status has changed since the consult dated: 08/28/14 in which the Rehabilitation Physician determined and documented that the patient's condition is appropriate for intensive rehabilitative care in an inpatient rehabilitation facility. See "History of Present Illness" (above) for medical update. Functional changes are: Currently requiring total assist +2 for stand pivot transfers. Patient's medical and functional status update has been discussed with the Rehabilitation physician and patient remains appropriate for inpatient rehabilitation. Will admit to inpatient rehab today.  Preadmission  Screen Completed By: Lelon Frohlich, with brief updates by Fae Pippin, 09/01/2017 11:05 AM ______________________________________________________________________   Discussed status with Dr. Riley Kill on 09/01/17 at  1235 and received telephone approval for admission today.  Admission Coordinator:  Fae Pippin, time 1235/Date 09/01/17

## 2017-09-01 ENCOUNTER — Encounter (HOSPITAL_COMMUNITY): Payer: Self-pay | Admitting: Nurse Practitioner

## 2017-09-01 ENCOUNTER — Inpatient Hospital Stay (HOSPITAL_COMMUNITY)
Admission: RE | Admit: 2017-09-01 | Discharge: 2017-09-22 | DRG: 057 | Disposition: A | Discharge status: Home Health | Payer: Medicaid Other | Source: Intra-hospital | Attending: Physical Medicine & Rehabilitation | Admitting: Physical Medicine & Rehabilitation

## 2017-09-01 DIAGNOSIS — D72829 Elevated white blood cell count, unspecified: Secondary | ICD-10-CM | POA: Diagnosis present

## 2017-09-01 DIAGNOSIS — R269 Unspecified abnormalities of gait and mobility: Secondary | ICD-10-CM | POA: Diagnosis not present

## 2017-09-01 DIAGNOSIS — D62 Acute posthemorrhagic anemia: Secondary | ICD-10-CM

## 2017-09-01 DIAGNOSIS — G8191 Hemiplegia, unspecified affecting right dominant side: Secondary | ICD-10-CM | POA: Diagnosis not present

## 2017-09-01 DIAGNOSIS — L299 Pruritus, unspecified: Secondary | ICD-10-CM | POA: Diagnosis present

## 2017-09-01 DIAGNOSIS — I10 Essential (primary) hypertension: Secondary | ICD-10-CM | POA: Diagnosis present

## 2017-09-01 DIAGNOSIS — N390 Urinary tract infection, site not specified: Secondary | ICD-10-CM | POA: Diagnosis not present

## 2017-09-01 DIAGNOSIS — I69091 Dysphagia following nontraumatic subarachnoid hemorrhage: Secondary | ICD-10-CM

## 2017-09-01 DIAGNOSIS — R630 Anorexia: Secondary | ICD-10-CM | POA: Diagnosis present

## 2017-09-01 DIAGNOSIS — G914 Hydrocephalus in diseases classified elsewhere: Secondary | ICD-10-CM | POA: Diagnosis present

## 2017-09-01 DIAGNOSIS — G40909 Epilepsy, unspecified, not intractable, without status epilepticus: Secondary | ICD-10-CM | POA: Diagnosis present

## 2017-09-01 DIAGNOSIS — I69398 Other sequelae of cerebral infarction: Secondary | ICD-10-CM | POA: Diagnosis not present

## 2017-09-01 DIAGNOSIS — R1313 Dysphagia, pharyngeal phase: Secondary | ICD-10-CM | POA: Diagnosis not present

## 2017-09-01 DIAGNOSIS — Z823 Family history of stroke: Secondary | ICD-10-CM | POA: Diagnosis not present

## 2017-09-01 DIAGNOSIS — I69018 Other symptoms and signs involving cognitive functions following nontraumatic subarachnoid hemorrhage: Secondary | ICD-10-CM

## 2017-09-01 DIAGNOSIS — B9620 Unspecified Escherichia coli [E. coli] as the cause of diseases classified elsewhere: Secondary | ICD-10-CM | POA: Diagnosis not present

## 2017-09-01 DIAGNOSIS — I69093 Ataxia following nontraumatic subarachnoid hemorrhage: Principal | ICD-10-CM

## 2017-09-01 DIAGNOSIS — S06350S Traumatic hemorrhage of left cerebrum without loss of consciousness, sequela: Secondary | ICD-10-CM | POA: Diagnosis not present

## 2017-09-01 DIAGNOSIS — Z982 Presence of cerebrospinal fluid drainage device: Secondary | ICD-10-CM | POA: Diagnosis not present

## 2017-09-01 DIAGNOSIS — R1312 Dysphagia, oropharyngeal phase: Secondary | ICD-10-CM

## 2017-09-01 DIAGNOSIS — I609 Nontraumatic subarachnoid hemorrhage, unspecified: Secondary | ICD-10-CM | POA: Diagnosis not present

## 2017-09-01 DIAGNOSIS — R1310 Dysphagia, unspecified: Secondary | ICD-10-CM | POA: Diagnosis present

## 2017-09-01 DIAGNOSIS — I611 Nontraumatic intracerebral hemorrhage in hemisphere, cortical: Secondary | ICD-10-CM | POA: Diagnosis not present

## 2017-09-01 DIAGNOSIS — I69151 Hemiplegia and hemiparesis following nontraumatic intracerebral hemorrhage affecting right dominant side: Secondary | ICD-10-CM | POA: Diagnosis not present

## 2017-09-01 DIAGNOSIS — I629 Nontraumatic intracranial hemorrhage, unspecified: Secondary | ICD-10-CM | POA: Diagnosis not present

## 2017-09-01 DIAGNOSIS — R1314 Dysphagia, pharyngoesophageal phase: Secondary | ICD-10-CM | POA: Diagnosis not present

## 2017-09-01 DIAGNOSIS — I69919 Unspecified symptoms and signs involving cognitive functions following unspecified cerebrovascular disease: Secondary | ICD-10-CM | POA: Diagnosis not present

## 2017-09-01 MED ORDER — ACETAMINOPHEN 160 MG/5ML PO SOLN
650.0000 mg | ORAL | Status: DC | PRN
  Administered 2017-09-02 – 2017-09-13 (×8): 650 mg
  Filled 2017-09-01 (×8): qty 20.3

## 2017-09-01 MED ORDER — LEVETIRACETAM 100 MG/ML PO SOLN
1000.0000 mg | Freq: Two times a day (BID) | ORAL | Status: DC
  Administered 2017-09-01 – 2017-09-03 (×5): 1000 mg
  Filled 2017-09-01 (×5): qty 10

## 2017-09-01 MED ORDER — POTASSIUM CHLORIDE 20 MEQ PO PACK
20.0000 meq | PACK | Freq: Every day | ORAL | Status: DC
Start: 2017-09-02 — End: 2017-09-09
  Administered 2017-09-02 – 2017-09-08 (×7): 20 meq via ORAL
  Filled 2017-09-01 (×8): qty 1

## 2017-09-01 MED ORDER — LOPERAMIDE HCL 1 MG/5ML PO LIQD
2.0000 mg | ORAL | Status: DC | PRN
  Administered 2017-09-07: 2 mg
  Filled 2017-09-01 (×3): qty 10

## 2017-09-01 MED ORDER — ACETAMINOPHEN 650 MG RE SUPP: 650.0000 mg | RECTAL | Status: DC | PRN

## 2017-09-01 MED ORDER — RANITIDINE HCL 150 MG/10ML PO SYRP
150.0000 mg | ORAL_SOLUTION | Freq: Two times a day (BID) | ORAL | Status: DC
  Administered 2017-09-01 – 2017-09-03 (×5): 150 mg
  Filled 2017-09-01 (×6): qty 10

## 2017-09-01 MED ORDER — ADULT MULTIVITAMIN LIQUID CH
15.0000 mL | Freq: Every day | ORAL | Status: DC
  Administered 2017-09-02 – 2017-09-03 (×2): 15 mL
  Filled 2017-09-01 (×3): qty 15

## 2017-09-01 MED ORDER — INFLUENZA VAC SPLIT QUAD 0.5 ML IM SUSY: 0.5000 mL | PREFILLED_SYRINGE | INTRAMUSCULAR | Status: DC

## 2017-09-01 MED ORDER — LABETALOL HCL 200 MG PO TABS
200.0000 mg | ORAL_TABLET | Freq: Two times a day (BID) | ORAL | Status: DC
  Administered 2017-09-01 – 2017-09-12 (×22): 200 mg via ORAL
  Filled 2017-09-01 (×23): qty 1

## 2017-09-01 MED ORDER — VITAL 1.5 CAL PO LIQD
1000.0000 mL | ORAL | Status: DC
  Administered 2017-09-01: 1000 mL
  Filled 2017-09-01 (×3): qty 1000

## 2017-09-01 MED ORDER — ENSURE ENLIVE PO LIQD
237.0000 mL | Freq: Two times a day (BID) | ORAL | Status: DC
  Administered 2017-09-02 (×2): 237 mL via ORAL

## 2017-09-01 NOTE — H&P (Signed)
Physical Medicine and Rehabilitation Admission H&P       Chief Complaint  Patient presents with  . Seizures  : HPI: Suzanne Brooks a 41 y.o.right handed femalewith history of hypertension as well as reported seizures.On no prescription medications at time of admission. Per report patient independent prior to admission multiple family in the area with daughter planning to provide assistance.Presented 07/30/2017 with seizure requiring intubation. Cranial CT scan showed diffuse subarachnoid hemorrhage. 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 A continuous EEG showed generalized polymorphic delta slowing suggesting severe encephalopathy no seizure.Maintained on Keppra for seizure prophylaxis. Patient with persistent leukocytosis as well as fever with infectious disease consulted placed on broad-spectrum antibiotics. CSF cultures negative antibiotics completed. 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 Dysphagia #2 diet and thin liquid diet as well as nasogastric tube feeds for nutritional support due to lack of appetite. Physical and occupationaltherapy evaluations completed with recommendations of physical medicine rehabilitation consult.patient was admitted for a comprehensive rehabilitation program  Review of Systems  Unable to perform ROS: Acuity of condition       Past Medical History:  Diagnosis Date  . Hypertension   . Seizures (HCC)         Past Surgical History:  Procedure Laterality Date  . IR 3D INDEPENDENT WKST  07/30/2017  . IR ANGIO INTRA EXTRACRAN SEL INTERNAL CAROTID BILAT MOD SED  07/30/2017  . IR ANGIO VERTEBRAL SEL VERTEBRAL UNI R MOD SED  07/30/2017  . IR ANGIOGRAM FOLLOW UP STUDY  07/30/2017  . IR  ANGIOGRAM FOLLOW UP STUDY  07/30/2017  . IR ANGIOGRAM FOLLOW UP STUDY  07/30/2017  . IR TRANSCATH/EMBOLIZ  07/30/2017  . LAPAROSCOPIC REVISION VENTRICULAR-PERITONEAL (V-P) SHUNT N/A 08/27/2017   Procedure: LAPAROSCOPIC REVISION VENTRICULAR-PERITONEAL (V-P) SHUNT;  Surgeon: Axel Filler, MD;  Location: [facility] OR;  Service: General;  Laterality: N/A;  . RADIOLOGY WITH ANESTHESIA N/A 07/30/2017   Procedure: RADIOLOGY WITH ANESTHESIA/ DR. Gerlene Burdock;  Surgeon: Radiologist, Medication, MD;  Location: [facility] OR;  Service: Radiology;  Laterality: N/A;  . VENTRICULOPERITONEAL SHUNT N/A 08/27/2017   Procedure: SHUNT INSERTION VENTRICULAR-PERITONEAL;  Surgeon: Lisbeth Renshaw, MD;  Location: [facility] OR;  Service: Neurosurgery;  Laterality: N/A;        Family History  Problem Relation Age of Onset  . Stroke Maternal Aunt   . Aneurysm Maternal Aunt   . Aneurysm Cousin        died after aneurysm   Social History:  reports that she has never smoked. She has never used smokeless tobacco. She reports that she drinks alcohol. She reports that she uses drugs, including Marijuana. Allergies: No Known Allergies No prescriptions prior to admission.    Drug Regimen Review Drug regimen was reviewed and remains appropriate with no significant issues identified  Home: Home Living Family/patient expects to be discharged to:: Inpatient rehab Additional Comments: Pt unable to provide history, and no family present in room at the time of PT eval. Unsure what home environment is.   Functional History: Prior Function Level of Independence: Independent Comments: Presumably   Functional Status:  Mobility: Bed Mobility Overal bed mobility: Needs Assistance Bed Mobility: Rolling, Sidelying to Sit Rolling: Max assist Sidelying to sit: Max assist Supine to sit: Max assist Sit to supine: +2 for physical assistance, Total assist General bed mobility comments: cues for sequence with pt reaching  partially  with RUE to rail to roll and unable to assist significantly. Assist to bring legs off of bed and elevate trunk to sitting. Pt with assist for balance EOB mod assist due to posterior lean Transfers Overall transfer level: Needs assistance Equipment used: None Transfer via Lift Equipment: [employer] Transfers: Sit to/from Stand, Pharmacologist Sit to Stand: Max assist, +2 physical assistance Stand pivot transfers: Total assist, +2 physical assistance General transfer comment: pt with cues for grasping therapists arms to stand and for anterior translation. Pt requires RLE blocked, assist for trunk anterior movement and assist to rise. Pt able to perform static standing with partially flexed hips and cues for posture and position. x 2 trials. Total assist to pivot as pt not weight shifting or moving feet Ambulation/Gait General Gait Details: unable at this time.   ADL: ADL Overall ADL's : Needs assistance/impaired Eating/Feeding: Supervision/ safety (thin liquids) Grooming: Wash/dry face, Oral care, Moderate assistance Grooming Details (indicate cue type and reason): pt assisted with toothbrushing once toothbrush brought ot mouth; iped out eyes Lower Body Bathing: Maximal assistance, Sit to/from stand, +2 for physical assistance Lower Body Dressing: Maximal assistance Lower Body Dressing Details (indicate cue type and reason): to don socks Toileting- Clothing Manipulation and Hygiene: Maximal assistance, +2 for physical assistance, Sit to/from stand Functional mobility during ADLs: +2 for physical assistance, Maximal assistance General ADL Comments: Pt able to maintain sitting EOB for brief periods with min A. Improved ability to maintain arousal once in chair  Cognition: Cognition Overall Cognitive Status: Impaired/Different from baseline Arousal/Alertness: Awake/alert Orientation Level: Oriented to person Attention: Focused, Sustained Focused Attention: Appears  intact Sustained Attention: Appears intact Memory: Impaired Memory Impairment: Storage deficit, Retrieval deficit Awareness: Impaired Awareness Impairment: Intellectual impairment Problem Solving: Impaired Cognition Arousal/Alertness: Awake/alert Behavior During Therapy: Flat affect Overall Cognitive Status: Impaired/Different from baseline Area of Impairment: Attention, Following commands, Orientation Orientation Level: Disoriented to, Time Current Attention Level: Focused Memory: Decreased short-term memory Following Commands: Follows one step commands inconsistently, Follows one step commands with increased time Safety/Judgement: Decreased awareness of safety, Decreased awareness of deficits Awareness: Intellectual Problem Solving: Slow processing, Decreased initiation, Difficulty sequencing, Requires verbal cues, Requires tactile cues General Comments: pt with eyes closed initially then able to open and maintain throughout session. Pt not verbalizing much today other than to state stomach pain. Following simple commands with increased time  Physical Exam: Blood pressure (!) 127/110, pulse 95, temperature 98.2 F (36.8 C), temperature source Axillary, resp. rate 18, height  (1.6 m), weight 60.7 kg (133 lb 13.1 oz), SpO2 100 %. Physical Exam  Vitals reviewed. Constitutional:  Sitting in chair with bilateral mittens  HENT:  VP craniotomy site clean and dry. Nasogastric tube in place  Eyes:  Pupils sluggish but reactive to light  Neck: Normal range of motion. Neck supple. No thyromegaly present.  Cardiovascular: Normal rate, regular rhythm and normal heart sounds.  Exam reveals no friction rub.   No murmur heard. Respiratory: No respiratory distress. She has no wheezes. She has no rales.  Limited inspiratory effort but clear to auscultation  GI: Soft. Bowel sounds are normal. She exhibits no distension.  Musculoskeletal: She exhibits no edema or tenderness.  Psychiatric:   Affect flat, disengaged  Skin. Warm and dry Neurological. Patient is alert and makes eye contact with examiner. Can track to left and right but is limited in pursuit. Can protrude tongue.. She did squeeze my hand on exam and moved to painful stimuli. She did attempt to mouth  a few words. She does answer simple yes and no questions. She will easily fall back asleep.       Medical Problem List and Plan: 1.  Decreased functional mobility and cognitive deficits secondary to [facility] with subsequent hydrocephalus and placement of the VPS             -admit to inpatient rehab 2.  DVT Prophylaxis/Anticoagulation: SCDs 3. Pain Management: Tylenol as needed 4. Mood:  Provide emotional support 5. Neuropsych: This patient is not capable of making decisions on her own behalf.             -mittens to prevent disruption of equipment             -fall safety precautions 6. Skin/Wound Care:  Routine skin checks 7. Fluids/Electrolytes/Nutrition: Routine I&O with follow-up chemistries 8.Seizure disorder.Keppra 1000 mg twice a day 9.Dysphagia. Dysphagia #2 thin liquids/nasogastric tube feeds to supplement---remove when able to adequately meet nutritional needs--follow daily I's and O's. 10.Hypertension.Labetaolol 200 mg BID             -DBP elevated particularly.              -check bp's q shift and with therapy.              -titrate regimen as needed 11.Leukocytosis. Improved. CSF negative. Infectious disease follow-up as needed. All antibiotics have since been discontinued   Post Admission Physician Evaluation: 1. Functional deficits secondary  to [facility] with subsequent hydrocephalus/placement of VPS. 2. Patient is admitted to receive collaborative, interdisciplinary care between the physiatrist, rehab nursing staff, and therapy team. 3. Patient's level of medical complexity and substantial therapy needs in context of that medical necessity cannot be provided at a lesser intensity of care such as a  SNF. 4. Patient has experienced substantial functional loss from his/her baseline which was documented above under the "Functional History" and "Functional Status" headings.  Judging by the patient's diagnosis, physical exam, and functional history, the patient has potential for functional progress which will result in measurable gains while on inpatient rehab.  These gains will be of substantial and practical use upon discharge  in facilitating mobility and self-care at the household level. 5. Physiatrist will provide 24 hour management of medical needs as well as oversight of the therapy plan/treatment and provide guidance as appropriate regarding the interaction of the two. 6. The Preadmission Screening has been reviewed and patient status is unchanged unless otherwise stated above. 7. 24 hour rehab nursing will assist with bladder management, bowel management, safety, skin/wound care, disease management, medication administration, pain management and patient education  and help integrate therapy concepts, techniques,education, etc. 8. PT will assess and treat for/with: Lower extremity strength, range of motion, stamina, balance, functional mobility, safety, adaptive techniques and equipment, NMR, cognitive perceptual deficits, family education.   Goals are: min to mod assist. 9. OT will assess and treat for/with: ADL's, functional mobility, safety, upper extremity strength, adaptive techniques and equipment , NMR, family education, cognitive perceptual deficits.   Goals are: min to mod assist. Therapy may not yet proceed with showering this patient. 10. SLP will assess and treat for/with: cognition, swallowing, communication, famiy ed.  Goals are: supervision to min/mod assist. 11. Case Management and Social Worker will assess and treat for psychological issues and discharge planning. 12. Team conference will be held weekly to assess progress toward goals and to determine barriers to  discharge. 13. Patient will receive at least 3 hours of therapy per day at least  5 days per week. 14. ELOS: 20-28 days       15. Prognosis:  excellent     Ranelle Oyster, MD, [facility] Physical Medicine & Rehabilitation 09/01/2017  Charlton Amor., PA-C 08/31/2017

## 2017-09-01 NOTE — Progress Notes (Addendum)
Inpatient Rehabilitation  Discussed case with bedside RN.  Patient off all IV meds, and consuming PO with Cortrack in place to ensure adequate nutrition.  Reviewed updated therapy notes.  I am covering for Genie and will plan to proceed with admission today if cleared by acute MD.  Please call with questions.   Received notification from nurse case manager that patient has been medically cleared to admit to IP Rehab today and attempted to update daughters.    Charlane Ferretti., CCC/SLP Admission Coordinator  [facility] Inpatient Rehabilitation  Cell [phone]

## 2017-09-01 NOTE — Progress Notes (Signed)
Occupational Therapy Treatment Patient Details Name: Suzanne Brooks MRN: [id number] DOB: 11/27/1975 Today's Date: 09/01/2017    History of present illness Pt is a 41 y/o female who presents with seizure activity. 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 PMHx: HTN, Sz   OT comments  This 41 yo female making progress towards grooming, sitting, and attention goals. She will continue to benefit from acute OT with follow up OT on CIR for best chance at functional recovery.  Follow Up Recommendations  CIR;Supervision/Assistance - 24 hour    Equipment Recommendations  Other (comment) (TBD at next venue)       Precautions / Restrictions Precautions Precautions: Fall Required Braces or Orthoses: Other Brace/Splint (Bil PRAFO) Other Brace/Splint: bil mittens Restrictions Weight Bearing Restrictions: No       Mobility Bed Mobility Overal bed mobility: Needs Assistance Bed Mobility: Supine to Sit     Supine to sit: Total assist;+2 for physical assistance        Transfers Overall transfer level: Needs assistance   Transfers: Squat Pivot Transfers   Stand pivot transfers: +2 physical assistance;Total assist       General transfer comment: using bed pad under pt and gait belt for transfer (pt with tendency to lean posteriorly and left)    Balance Overall balance assessment: Needs assistance Sitting-balance support: Feet supported;Bilateral upper extremity supported   Sitting balance - Comments: pt with variable balance from min A to max A Postural control: Posterior lean;Left lateral lean                                 ADL either performed or assessed with clinical judgement   ADL Overall ADL's : Needs assistance/impaired     Grooming: Wash/dry face Grooming Details (indicate cue type and reason): once washcloth placed in pt's LUE she was able to wash her eyes, wash  her forehead and her left ear with cues for each part to wash; for RUE once washcloth placed and I helped her raise her left hand to her left ear, she washed her left ear                                     Vision   Vision Assessment?: Vision impaired- to be further tested in functional context Additional Comments: Pt was able with her LUE to reach up and touch my hand in front of her in several planes in her left visual field and I did her her once to raise her RUE to touch my hand her right visual field          Cognition Arousal/Alertness: Awake/alert Behavior During Therapy: Flat affect Overall Cognitive Status: Impaired/Different from baseline Area of Impairment: Attention;Following commands;Safety/judgement;Awareness;Problem solving                   Current Attention Level: Sustained   Following Commands: Follows one step commands inconsistently;Follows one step commands with increased time Safety/Judgement: Decreased awareness of safety;Decreased awareness of deficits Awareness: Intellectual Problem Solving: Slow processing;Decreased initiation;Difficulty sequencing;Requires verbal cues;Requires tactile cues General Comments: pt with eyes open the whole session; the only attempt to verbalize was mouthing goodbye once asked her to say it; when I asked her to give me 5 she did and then when I  immediately asked her to give the tech 5 (she counted out 5 fingers) both with her left hand                   Pertinent Vitals/ Pain       Pain Assessment: No/denies pain         Frequency  Min 2X/week        Progress Toward Goals  OT Goals(current goals can now be found in the care plan section)  Progress towards OT goals: Progressing toward goals     Plan Discharge plan remains appropriate       AM-PAC PT "6 Clicks" Daily Activity     Outcome Measure   Help from another person eating meals?: Total Help from another person taking care of  personal grooming?: A Lot Help from another person toileting, which includes using toliet, bedpan, or urinal?: Total Help from another person bathing (including washing, rinsing, drying)?: Total Help from another person to put on and taking off regular upper body clothing?: Total Help from another person to put on and taking off regular lower body clothing?: Total 6 Click Score: 7    End of Session Equipment Utilized During Treatment: Gait belt  OT Visit Diagnosis: Muscle weakness (generalized) (M62.81);Other abnormalities of gait and mobility (R26.89);Other symptoms and signs involving cognitive function   Activity Tolerance Patient tolerated treatment well   Patient Left in chair;with call bell/phone within reach;with chair alarm set   Nurse Communication Mobility status;Need for lift equipment (RN In room when we transferred pt from bed>recliner with belt and chuck pad--they used lift pad yesterday)        Time: 1610-9604 OT Time Calculation (min): 25 min  Charges: OT General Charges $OT Visit: 1 Visit OT Treatments $Therapeutic Activity: 23-37 mins  Ignacia Palma, OTR/L [phone] 09/01/2017

## 2017-09-01 NOTE — Progress Notes (Signed)
Physical Therapy Treatment Patient Details Name: Suzanne Brooks MRN: [id number] DOB: 1976/11/09 Today's Date: 09/01/2017    History of Present Illness Pt is a 41 y/o female who presents with seizure activity. 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 PMHx: HTN, Sz    PT Comments    Pt. Sleeping in room upon therapist arrival.  Took mild/mod stimulus to wake pt. up and stay awake.  Pt became more involved as treatment proceeded.  Pt. was singing by end of session.  Follow Up Recommendations  CIR;Supervision/Assistance - 24 hour     Equipment Recommendations  Wheelchair (measurements PT);Wheelchair cushion (measurements PT)    Recommendations for Other Services       Precautions / Restrictions Precautions Precautions: Fall Required Braces or Orthoses: Other Brace/Splint Other Brace/Splint: bil mittens Restrictions Weight Bearing Restrictions: No    Mobility                    Transfers Overall transfer level: Needs assistance Equipment used: None Transfers: Sit to/from Stand Sit to Stand: Max assist;+2 physical assistance         General transfer comment: using bed pad under pt and gait belt for transfer (pt with tendency to lean posteriorly).  Pt. initiated movement to stand.  [place] used to transfer pt back to bed.                              Balance Overall balance assessment: Needs assistance Sitting-balance support: Feet supported;Bilateral upper extremity supported Sitting balance-Leahy Scale: Zero Sitting balance - Comments: pt with variable balance from min A to max A Postural control: Posterior lean   Standing balance-Leahy Scale: Zero Standing balance comment: Pt. needed to lean on therapists to maintain standing c max assist. R knee block, supported trunk and pelvic.                            Cognition Arousal/Alertness:  Awake/alert Behavior During Therapy: Flat affect Overall Cognitive Status: Impaired/Different from baseline Area of Impairment: Attention;Following commands;Safety/judgement;Awareness;Problem solving;Orientation;Memory                 Orientation Level: Disoriented to;Person;Time;Place;Situation Current Attention Level: Sustained Memory: Decreased short-term memory Following Commands: Follows one step commands inconsistently;Follows one step commands with increased time Safety/Judgement: Decreased awareness of safety;Decreased awareness of deficits Awareness: Intellectual Problem Solving: Slow processing;Decreased initiation;Difficulty sequencing;Requires verbal cues;Requires tactile cues General Comments: pt. c eyes open 80% of session.  Followed simple comands 50% of time.  Pt started singing towrds end of session.      Exercises General Exercises - Lower Extremity Long Arc Quad: AROM;AAROM;PROM;Seated;Both;5 reps Other Exercises Other Exercises: pt. reached down to "pull socks up" and to put lotion on R LE and R UE c both arms.  Pt. reached and touched therapists hand 1x c L UE.    General Comments        Pertinent Vitals/Pain Pain Assessment: Faces Faces Pain Scale: No hurt Pain Intervention(s): Monitored during session    Home Living                      Prior Function            PT Goals (current goals can now be found in the care plan section) Acute Rehab PT  Goals PT Goal Formulation: Patient unable to participate in goal setting Time For Goal Achievement: 09/08/17 Potential to Achieve Goals: Fair    Frequency    Min 3X/week      PT Plan Current plan remains appropriate                  AM-PAC PT "6 Clicks" Daily Activity  Outcome Measure  Difficulty turning over in bed (including adjusting bedclothes, sheets and blankets)?: Unable Difficulty moving from lying on back to sitting on the side of the bed? : Unable Difficulty sitting  down on and standing up from a chair with arms (e.g., wheelchair, bedside commode, etc,.)?: Unable Help needed moving to and from a bed to chair (including a wheelchair)?: Total Help needed walking in hospital room?: Total Help needed climbing 3-5 steps with a railing? : Total 6 Click Score: 6    End of Session Equipment Utilized During Treatment: Gait belt Activity Tolerance: Patient tolerated treatment well Patient left: in bed;with call bell/phone within reach;with family/visitor present;with nursing/sitter in room   PT Visit Diagnosis: Muscle weakness (generalized) (M62.81);Other symptoms and signs involving the nervous system (R29.898);Other abnormalities of gait and mobility (R26.89)     Time: 1610-9604 PT Time Calculation (min) (ACUTE ONLY): 43 min  Charges:  $Therapeutic Exercise: 8-22 mins $Therapeutic Activity: 23-37 mins                    G CodesNeomia Brooks, SPT [phone] office   Suzanne Brooks 09/01/2017, 5:36 PM

## 2017-09-01 NOTE — Progress Notes (Addendum)
Patient ID: Suzanne Brooks, female   DOB: 02-Nov-1976, 41 y.o.   MRN: [id number] Patient admitted to [phone] via bed, escorted by nursing staff.  Patient oriented to unit as much as possible but is limited to cognition.  Due to cognition, admission assessment also limited.  Patient is inconsistent with following commands and speaks when she chooses. Patient appears to be in no immediate distress at this time.  NG tube intact to right nare.  Will continue to monitor.  Dani Gobble, RN

## 2017-09-02 ENCOUNTER — Inpatient Hospital Stay (HOSPITAL_COMMUNITY): Payer: Medicaid Other | Admitting: Speech Pathology

## 2017-09-02 ENCOUNTER — Inpatient Hospital Stay (HOSPITAL_COMMUNITY): Payer: Medicaid Other | Admitting: Occupational Therapy

## 2017-09-02 ENCOUNTER — Inpatient Hospital Stay (HOSPITAL_COMMUNITY): Payer: Medicaid Other | Admitting: Physical Therapy

## 2017-09-02 DIAGNOSIS — I69919 Unspecified symptoms and signs involving cognitive functions following unspecified cerebrovascular disease: Secondary | ICD-10-CM

## 2017-09-02 DIAGNOSIS — I69398 Other sequelae of cerebral infarction: Secondary | ICD-10-CM

## 2017-09-02 DIAGNOSIS — R269 Unspecified abnormalities of gait and mobility: Secondary | ICD-10-CM

## 2017-09-02 LAB — CBC WITH DIFFERENTIAL/PLATELET
BASOS PCT: 0 %
Basophils Absolute: 0 10*3/uL (ref 0.0–0.1)
EOS PCT: 7 %
Eosinophils Absolute: 0.9 10*3/uL — ABNORMAL HIGH (ref 0.0–0.7)
HCT: 26.8 % — ABNORMAL LOW (ref 36.0–46.0)
HEMOGLOBIN: 8.4 g/dL — AB (ref 12.0–15.0)
Lymphocytes Relative: 20 %
Lymphs Abs: 2.5 10*3/uL (ref 0.7–4.0)
MCH: 27.4 pg (ref 26.0–34.0)
MCHC: 31.3 g/dL (ref 30.0–36.0)
MCV: 87.3 fL (ref 78.0–100.0)
MONO ABS: 0.9 10*3/uL (ref 0.1–1.0)
Monocytes Relative: 7 %
NEUTROS ABS: 8 10*3/uL — AB (ref 1.7–7.7)
NEUTROS PCT: 66 %
Platelets: 423 10*3/uL — ABNORMAL HIGH (ref 150–400)
RBC: 3.07 MIL/uL — ABNORMAL LOW (ref 3.87–5.11)
RDW: 20.8 % — ABNORMAL HIGH (ref 11.5–15.5)
WBC: 12.3 10*3/uL — ABNORMAL HIGH (ref 4.0–10.5)

## 2017-09-02 LAB — COMPREHENSIVE METABOLIC PANEL
ALK PHOS: 78 U/L (ref 38–126)
ALT: 54 U/L (ref 14–54)
AST: 29 U/L (ref 15–41)
Albumin: 3.5 g/dL (ref 3.5–5.0)
Anion gap: 10 (ref 5–15)
BUN: 18 mg/dL (ref 6–20)
CALCIUM: 9.9 mg/dL (ref 8.9–10.3)
CO2: 27 mmol/L (ref 22–32)
CREATININE: 0.49 mg/dL (ref 0.44–1.00)
Chloride: 101 mmol/L (ref 101–111)
Glucose, Bld: 95 mg/dL (ref 65–99)
Potassium: 4 mmol/L (ref 3.5–5.1)
Sodium: 138 mmol/L (ref 135–145)
TOTAL PROTEIN: 7.6 g/dL (ref 6.5–8.1)
Total Bilirubin: 0.7 mg/dL (ref 0.3–1.2)

## 2017-09-02 MED ORDER — VITAL 1.5 CAL PO LIQD
1000.0000 mL | ORAL | Status: DC
  Administered 2017-09-02: 1000 mL
  Filled 2017-09-02 (×2): qty 1000

## 2017-09-02 MED ORDER — FREE WATER
200.0000 mL | Freq: Three times a day (TID) | Status: DC
  Administered 2017-09-02 – 2017-09-15 (×39): 200 mL

## 2017-09-02 MED ORDER — METHYLPHENIDATE HCL 5 MG PO TABS
5.0000 mg | ORAL_TABLET | Freq: Two times a day (BID) | ORAL | Status: DC
  Administered 2017-09-02 – 2017-09-07 (×10): 5 mg via ORAL
  Filled 2017-09-02 (×10): qty 1

## 2017-09-02 MED ORDER — ENSURE ENLIVE PO LIQD
237.0000 mL | Freq: Three times a day (TID) | ORAL | Status: DC
  Administered 2017-09-02 – 2017-09-18 (×26): 237 mL via ORAL

## 2017-09-02 MED ORDER — AQUAPHOR EX OINT
TOPICAL_OINTMENT | Freq: Two times a day (BID) | CUTANEOUS | Status: DC | PRN
  Filled 2017-09-02: qty 50

## 2017-09-02 MED ORDER — FREE WATER
200.0000 mL | Freq: Three times a day (TID) | Status: DC
  Administered 2017-09-02 (×2): 200 mL

## 2017-09-02 NOTE — Progress Notes (Signed)
Initial Nutrition Assessment  DOCUMENTATION CODES:   Not applicable  INTERVENTION:  Initiate nocturnal tube feeds of Vital 1.5 formula via Cortrak NGT at goal rate of 80 ml/hr x 12 hours (7pm-7am) to provide 1440 kcal (78% of kcal needs), 65 grams of protein (81% of protein needs), and 730 ml of water.  Continue free water flushes of 200 ml TID per tube until fluids are adequate.   Encourage PO intake.  Provide Ensure Enlive po TID, each supplement provides 350 kcal and 20 grams of protein.  72 hour calorie count has been initiated.  NUTRITION DIAGNOSIS:   Inadequate oral intake related to dysphagia as evidenced by meal completion < 50%  GOAL:   Patient will meet greater than or equal to 90% of their needs  MONITOR:   PO intake, Supplement acceptance, Labs, Weight trends, Skin, I & O's, Diet advancement, TF tolerance  REASON FOR ASSESSMENT:   Consult Enteral/tube feeding initiation and management (Calorie count)  ASSESSMENT:   41 y.o.right handed female with history of hypertension as well as reported seizures. Presented 07/30/2017 with seizure requiring intubation.  Cranial CT scan showed diffuse subarachnoid hemorrhage. 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 A continuous EEG showed generalized polymorphic delta slowing suggesting severe encephalopathy no seizure. 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 Dysphagia #2 diet and thin liquid diet as well as nasogastric tube feeds for nutritional support due to lack of appetite.  Calorie count has been initiated for 72 hours. RN has stopped tube feeds to allow for encouragement of PO intake at meals as pt did not consume any breakfast this AM except for a couple of sips of Ensure with SLP. RD to modify tube feeding orders to nocturnal feeds to  allow and encourage PO intake during the day. Additionally will increase Ensure to TID. RD to follow up tomorrow for day 1 calorie count result. Per RN, pt is currently on Vital formula due to diarrhea reported from tube feeds on previous floor. RN reports pt with no diarrhea since rehab admission.   Pt with no observed significant fat or muscle mass loss. Weight has been stable.   Labs and medications reviewed.   Diet Order:  DIET DYS 2 Room service appropriate? Yes; Fluid consistency: Thin  Skin:   (Incision on head and chest)  Last BM:  10/16   Height:   Ht Readings from Last 1 Encounters:  09/01/17 5\' 3"  (1.6 m)    Weight:   Wt Readings from Last 1 Encounters:  09/02/17 140 lb 3.4 oz (63.6 kg)    Ideal Body Weight:  52.27 kg  BMI:  Body mass index is 24.84 kg/m.  Estimated Nutritional Needs:   Kcal:  1850-2050  Protein:  80-95 grams  Fluid:  1.8 - 2 L/day  EDUCATION NEEDS:   No education needs identified at this time  Roslyn SmilingStephanie Deseray Daponte, MS, RD, LDN Pager # [phone] After hours/ weekend pager # [phone]

## 2017-09-02 NOTE — Progress Notes (Signed)
Patient information reviewed and entered into eRehab system by Caitlain Tweed, RN, CRRN, PPS Coordinator.  Information including medical coding and functional independence measure will be reviewed and updated through discharge.    

## 2017-09-02 NOTE — Evaluation (Signed)
Occupational Therapy Assessment and Plan  Patient Details  Name: Suzanne Brooks MRN: [id number] Date of Birth: Jul 11, 1976  OT Diagnosis: abnormal posture, ataxia, cognitive deficits, hemiplegia affecting dominant side and muscle weakness (generalized) Rehab Potential: Rehab Potential (ACUTE ONLY): Fair ELOS: 21-25 days   Today's Date: 09/02/2017 OT Individual Time: 1300-1415 OT Individual Time Calculation (min): 75 min     Problem List:  Patient Active Problem List   Diagnosis Date Noted  . Endotracheal tube present   . ICH (intracerebral hemorrhage) ([place]) 07/30/2017  . SAH (subarachnoid hemorrhage) ([place]) 07/30/2017  . Seizure-like activity ([place])   . Acute respiratory failure with hypoxemia [facility])     Past Medical History:  Past Medical History:  Diagnosis Date  . Hypertension   . Seizures ([place])    Past Surgical History:  Past Surgical History:  Procedure Laterality Date  . IR 3D INDEPENDENT WKST  07/30/2017  . IR ANGIO INTRA EXTRACRAN SEL INTERNAL CAROTID BILAT MOD SED  07/30/2017  . IR ANGIO VERTEBRAL SEL VERTEBRAL UNI R MOD SED  07/30/2017  . IR ANGIOGRAM FOLLOW UP STUDY  07/30/2017  . IR ANGIOGRAM FOLLOW UP STUDY  07/30/2017  . IR ANGIOGRAM FOLLOW UP STUDY  07/30/2017  . IR TRANSCATH/EMBOLIZ  07/30/2017  . LAPAROSCOPIC REVISION VENTRICULAR-PERITONEAL (V-P) SHUNT N/A 08/27/2017   Procedure: LAPAROSCOPIC REVISION VENTRICULAR-PERITONEAL (V-P) SHUNT;  Surgeon: Ralene Ok, MD;  Location: [place];  Service: General;  Laterality: N/A;  . RADIOLOGY WITH ANESTHESIA N/A 07/30/2017   Procedure: RADIOLOGY WITH ANESTHESIA/ DR. Bradly Chris;  Surgeon: Radiologist, Medication, MD;  Location: [place];  Service: Radiology;  Laterality: N/A;  . VENTRICULOPERITONEAL SHUNT N/A 08/27/2017   Procedure: SHUNT INSERTION VENTRICULAR-PERITONEAL;  Surgeon: Consuella Lose, MD;  Location: [place];  Service: Neurosurgery;  Laterality: N/A;    Assessment & Plan Clinical Impression: Suzanne Brooks  a 41 y.o.right handed femalewith history of hypertension as well as reported seizures.On no prescription medications at time of admission. Per report patient independent prior to admission multiple family in the area with daughter planning to provide assistance.Presented 07/30/2017 with seizure requiring intubation. Cranial CT scan showed diffuse subarachnoid hemorrhage. 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 A continuous EEG showed generalized polymorphic delta slowing suggesting severe encephalopathy no seizure.Maintained on Keppra for seizure prophylaxis.Patient with persistent leukocytosis as well as fever with infectious disease consulted placed on broad-spectrum antibiotics. CSF cultures negative antibiotics completed. 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 Dysphagia #2 dietandthinliquid dietas well as nasogastric tube feeds for nutritional support due to lack of appetite. Physical and occupationaltherapy evaluationscompleted with recommendations of physical medicine rehabilitation consult.patient was admitted for a comprehensive rehabilitation program  Patient transferred to CIR on 09/01/2017 .    Patient currently requires max with basic self-care skills secondary to muscle weakness, decreased cardiorespiratoy endurance, unbalanced muscle activation, ataxia and decreased coordination, decreased attention, decreased awareness, decreased problem solving, decreased safety awareness, decreased memory and delayed processing and decreased sitting balance, decreased standing balance, decreased postural control, hemiplegia and decreased balance strategies.  Prior to hospitalization, patient could complete ADLs/IADLs with independent  .  Patient will benefit from skilled intervention to decrease level of assist with basic self-care skills and increase independence with basic self-care skills prior to discharge home with care partner.  Anticipate patient will require minimal physical assistance and follow up home health.  OT - End of Session Activity  Tolerance: Tolerates 10 - 20 min activity with multiple rests Endurance Deficit: Yes Endurance Deficit Description: Required rest breaks throughout seated bathing/dressing session OT Assessment Rehab Potential (ACUTE ONLY): Fair OT Barriers to Discharge: Inaccessible home environment;Decreased caregiver support;Home environment access/layout;Incontinence;Insurance for SNF coverage OT Barriers to Discharge Comments: Lives on second level apartment, unsure of children's true ability to provide needed phsyical and cognive assist for her at d/c OT Patient demonstrates impairments in the following area(s): Balance;Cognition;Behavior;Endurance;Safety;Motor;Perception;Sensory OT Basic ADL's Functional Problem(s): Eating;Grooming;Bathing;Dressing;Toileting OT Transfers Functional Problem(s): Toilet;Tub/Shower OT Additional Impairment(s): Fuctional Use of Upper Extremity OT Plan OT Intensity: Minimum of 1-2 x/day, 45 to 90 minutes OT Frequency: 5 out of 7 days OT Duration/Estimated Length of Stay: 21-25 days OT Treatment/Interventions: Balance/vestibular training;Cognitive remediation/compensation;Discharge planning;Community reintegration;Disease mangement/prevention;Functional mobility training;Neuromuscular re-education;Pain management;Functional electrical stimulation;Patient/family education;Psychosocial support;Self Care/advanced ADL retraining;Therapeutic Activities;Splinting/orthotics;Therapeutic Exercise;UE/LE Strength taining/ROM;UE/LE Coordination activities;Wheelchair propulsion/positioning OT Self Feeding Anticipated Outcome(s): Supervision OT Basic Self-Care Anticipated  Outcome(s): Min A OT Toileting Anticipated Outcome(s): Min A OT Bathroom Transfers Anticipated Outcome(s): Min-mod A OT Recommendation Patient destination: Home (Plan for home, however, may need SNF) Follow Up Recommendations: Home health OT Equipment Recommended: 3 in 1 bedside comode   Skilled Therapeutic Intervention Pt seen for OT eval and ADL bathing/dressing session. Pt in supine upon arrival with RN present assisting pt with meal, hand off to OT. Pt declining eating anymore lunch when set-up to self-feed, however, would take bites when fed with total A.  She transitioned to EOB with max A and multimodal cuing for sequencing/ technique. She required mod-max A to maintain static sitting balance on EOB, difficulty following cues to use bed rails to assist with UE stabilization assist. Used STEDY with +2 assist to transfer to w/c. Pt demonstrating pusher tendencies into extension with L LE, requiring max A for anterior weight shift needed to utilize STEDY. She completed bathing/dressing from w/c level at sink with total A set-up and VCs. +2 for standing balance while caregiver provided assist to complete pericare/buttock hygiene and clothing management.  Grooming completed from w/c level at sink, VCs required.  Pt returned to nurses station at end of session for supervision, QRB donned for safety. Pt scratching self continuously throughout session and difficulty maintaining attention to task because of this. RN already aware and Aquaphor applied all over following bathing task.   OT Evaluation Precautions/Restrictions  Precautions Precautions: Fall Restrictions Weight Bearing Restrictions: No General Chart Reviewed: Yes Pain    No/ denies pain Home Living/Prior Functioning Home Living Family/patient expects to be discharged to:: Unsure Living Arrangements: Children Available Help at Discharge: Family, Available PRN/intermittently Type of Home: Apartment Home Access: Stairs to  enter [employer] of Steps: 12-15 Entrance Stairs-Rails: Right, Left, Can reach both Home Layout: One level Bathroom Accessibility: Yes Additional Comments: Pt unable to provide history, and no family present in room at the time of OTeval. Unsure what home environment is.  Lives With: Family, Other (Comment) (Mother and children) IADL History Homemaking Responsibilities: Yes Prior Function Level of Independence: Independent with basic ADLs, Independent with homemaking with ambulation, Independent with gait, Independent with transfers  Able to Take Stairs?: Yes Driving: No Vocation: Full time employment Comments: Unable to obtain accurate PLOF due to pt's cognitive and langauge deficits Vision Baseline Vision/History: Wears glasses Patient Visual Report: No change from baseline Vision Assessment?: Vision impaired- to be further tested in functional context Additional Comments: Cuing for attention to R  Perception  Perception: Impaired Inattention/Neglect: Does not attend to right visual field Praxis  Praxis: Impaired Praxis Impairment Details: Initiation;Ideomotor Cognition Overall Cognitive Status: Impaired/Different from baseline Arousal/Alertness: Awake/alert Orientation Level: Person;Situation Year: Other (Comment) (1998) Month:  ("Fall") Day of Week: Incorrect Memory: Impaired Memory Impairment: Storage deficit;Retrieval deficit;Decreased short term memory Decreased Short Term Memory: Verbal basic;Functional basic Immediate Memory Recall: Sock;Blue;Bed Memory Recall: Blue Memory Recall Blue: Without Cue Attention: Sustained Focused Attention: Appears intact Sustained Attention: Impaired Sustained Attention Impairment: Verbal basic;Functional basic Awareness: Impaired Awareness Impairment: Intellectual impairment Problem Solving: Impaired Problem Solving Impairment: Verbal basic;Functional basic Executive Function:  (All areas impaired due to lower level  deficits) Behaviors: Restless (Scatching body constantly thorughout session) Safety/Judgment: Impaired Comments: Decreased awareness of deficits Sensation Sensation Light Touch: Impaired Detail Light Touch Impaired Details: Impaired RLE;Impaired RUE Proprioception: Appears Intact Additional Comments: Unable to formally assess due to cognitive deficits Coordination Gross Motor Movements are Fluid and Coordinated: No Fine Motor Movements are Fluid and Coordinated: Yes Coordination and Movement Description: Decreased coordination in R UE due to inattention Motor  Motor Motor: Hemiplegia;Motor impersistence;Abnormal postural alignment and control Motor - Skilled Clinical Observations: R sided Hemiplegia LE>UE Mobility     Trunk/Postural Assessment  Cervical Assessment Cervical Assessment: Exceptions to [facility] (L head turn with L gaze preference) Thoracic Assessment Thoracic Assessment: Exceptions to [facility] (Kyphotic) Lumbar Assessment Lumbar Assessment: Exceptions to [facility] (Posterior pelvic tilt) Postural Control Postural Control: Deficits on evaluation (Strong posterior push in standing)  Balance Balance Balance Assessed: Yes Static Sitting Balance Static Sitting - Balance Support: Feet supported Static Sitting - Level of Assistance: 2: Max assist Static Sitting - Comment/# of Minutes: Sitting EOB Dynamic Sitting Balance Dynamic Sitting - Balance Support: Feet supported;During functional activity Dynamic Sitting - Level of Assistance: 2: Max assist;1: +1 Total assist Sitting balance - Comments: Sitting EOB Static Standing Balance Static Standing - Balance Support: Bilateral upper extremity supported Static Standing - Level of Assistance: 1: +2 Total assist Static Standing - Comment/# of Minutes: Standing in STEDY with +2 Dynamic Standing Balance Dynamic Standing - Balance Support: During functional activity;Bilateral upper extremity supported Dynamic Standing - Level of Assistance:  1: +2 Total assist;Other (comment) (Standing in STEDY with +2) Extremity/Trunk Assessment RUE Assessment RUE Assessment: Within Functional Limits (From observation during functional tasks appears [facility], however, unable to formally assess due to cognition/ attention) LUE Assessment LUE Assessment: Exceptions to [facility] (From observation during functional tasks appears [facility], however, unable to formally assess due to cognition/ attention)   See Function Navigator for Current Functional Status.   Refer to Care Plan for Long Term Goals  Recommendations for other services: None    Discharge Criteria: Patient will be discharged from OT if patient refuses treatment 3 consecutive times without medical reason, if treatment goals not met, if there is a change in medical status, if patient makes no progress towards goals or if patient is discharged from hospital.  The above assessment, treatment plan, treatment alternatives and goals were discussed and mutually agreed upon: by patient  Ernestina Patches 09/02/2017, 3:37 PM

## 2017-09-02 NOTE — Progress Notes (Signed)
Fae P[facility]in [facility] Admission Coordinator Signed Physical Medicine and Rehabilitation  PMR Pre-admission Date of Service: 08/31/2017 9:49 PM  Related encounter: ED to Hosp-Admission (Discharged) from 07/30/2017 in [facility] NEURO/TRAUMA/SURGICAL ICU       [] Hide copied text PMR Admission Coordinator Pre-Admission Assessment  Patient: Suzanne Brooks is an 41 y.o., female MRN: [id number] DOB: 1976/07/29 Height: 5\' 3"  (160 cm) Weight: 60.7 kg (133 lb 13.1 oz)                                                                                                                                            Insurance Information HMO:     PPO:       PCP:       IPA:       80/20:       OTHER:   PRIMARY:  Medicaid [place] access      Policy#: [id number]      Subscriber: Enrigue Catena CM Name:        Phone#:       Fax#:   Pre-Cert#:        Employer: Works PT Benefits:  Phone #: [phone]     Name:  Automated Eff. Date: Eligible 08/31/17     Deduct:        Out of Pocket Max:        Life Max:   CIR:        SNF:   Outpatient:       Co-Pay:   Home Health:        Co-Pay:   DME:       Co-Pay:   Providers:    Medicaid Application Date:        Case Manager:   Disability Application Date:        Case Worker:    Emergency Contact Information        Contact Information    Name Relation Home Work Mobile   [place] Daughter   [phone]   [facility] Daughter   [phone]   [place], [place] Daughter   [phone]   Clarita Leber Mother   [phone]   Vergie Living Significant other   [phone]     Current Medical History  Patient Admitting Diagnosis: 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 On no prescription medications at time of admission. Per report patient independent prior to admission multiple family in the area questioned assistance on  discharge. Presented 07/30/2017 with seizure requiring intubation. Cranial CT scan showed diffuse subarachnoid hemorrhage. 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 A continuous EEG showed generalized polymorphic delta slowing suggesting severe encephalopathy no seizure.Maintained on Keppra for seizure prophylaxis.Patient with persistent leukocytosis as well as fever  with infectious disease consulted placed on broad-spectrum antibiotics. CSF cultures negative antibiotics completed. 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 Dysphagia #2 diet. Physical and occupational therapy evaluationscompleted with recommendations of physical medicine rehabilitation consult.  Patient to be admitted for a comprehensive inpatient rehabilitation program.  Total: 7=NIH  Past Medical History      Past Medical History:  Diagnosis Date  . Hypertension   . Seizures (HCC)     Family History  family history includes Aneurysm in her cousin and maternal aunt; Stroke in her maternal aunt.  Prior Rehab/Hospitalizations: No previous rehab.  Has the patient had major surgery during 100 days prior to admission? No  Current Medications   Current Facility-Administered Medications:  .  0.45 % sodium chloride infusion, , Intravenous, Continuous, Harbrecht, Lawrence, MD, Last Rate: 10 mL/hr at 09/01/17 0700 .  [DISCONTINUED] acetaminophen (TYLENOL) tablet 650 mg, 650 mg, Oral, Q4H PRN **OR** acetaminophen (TYLENOL) solution 650 mg, 650 mg, Per Tube, Q4H PRN, 650 mg at 08/27/17 2023 **OR** acetaminophen (TYLENOL) suppository 650 mg, 650 mg, Rectal, Q4H PRN, Lisbeth Renshaw, MD, 650 mg at 08/06/17 1053 .  calamine lotion, , Topical, PRN, Tobey Grim,  NP, 1 application at 08/25/17 2253 .  chlorhexidine (PERIDEX) 0.12 % solution 15 mL, 15 mL, Mouth Rinse, BID, Lisbeth Renshaw, MD, 15 mL at 09/01/17 0800 .  feeding supplement (ENSURE ENLIVE) (ENSURE ENLIVE) liquid 237 mL, 237 mL, Oral, BID BM, Lisbeth Renshaw, MD, 237 mL at 09/01/17 0901 .  feeding supplement (VITAL 1.5 CAL) liquid 1,000 mL, 1,000 mL, Per Tube, Continuous, Lisbeth Renshaw, MD, Last Rate: 55 mL/hr at 09/01/17 0700, 1,000 mL at 09/01/17 0700 .  fentaNYL (SUBLIMAZE) injection 12.5-25 mcg, 12.5-25 mcg, Intravenous, Q2H PRN, Max Fickle B, MD, 25 mcg at 08/13/17 0436 .  hydrALAZINE (APRESOLINE) injection 10 mg, 10 mg, Intravenous, Q4H PRN, Ginger Carne, MD, 10 mg at 08/26/17 1220 .  ibuprofen (ADVIL,MOTRIN) 100 MG/5ML suspension 600 mg, 600 mg, Per Tube, Q6H PRN, Lisbeth Renshaw, MD, 600 mg at 08/23/17 2214 .  labetalol (NORMODYNE) tablet 200 mg, 200 mg, Oral, BID, Harbrecht, Lawrence, MD, 200 mg at 09/01/17 0900 .  labetalol (NORMODYNE,TRANDATE) injection 20 mg, 20 mg, Intravenous, Q10 min PRN, Whiteheart, Kathryn A, NP, 20 mg at 08/26/17 1610 .  levETIRAcetam (KEPPRA) 100 MG/ML solution 1,000 mg, 1,000 mg, Per Tube, BID, Coralyn Helling, MD, 1,000 mg at 09/01/17 0900 .  loperamide (IMODIUM) 1 MG/5ML solution 2 mg, 2 mg, Per Tube, PRN, Lisbeth Renshaw, MD, 2 mg at 08/28/17 1754 .  MEDLINE mouth rinse, 15 mL, Mouth Rinse, q12n4p, Lisbeth Renshaw, MD, 15 mL at 09/01/17 0901 .  metoprolol tartrate (LOPRESSOR) injection 2.5-5 mg, 2.5-5 mg, Intravenous, Q3H PRN, Whiteheart, Kathryn A, NP, 5 mg at 08/25/17 0533 .  multivitamin liquid 15 mL, 15 mL, Per Tube, Daily, Ramaswamy, Murali, MD, 15 mL at 09/01/17 0900 .  nystatin (MYCOSTATIN) 100000 UNIT/ML suspension 500,000 Units, 5 mL, Oral, QID, Tobey Grim, NP, 500,000 Units at 09/01/17 0901 .  ondansetron (ZOFRAN-ODT) disintegrating tablet 4 mg, 4 mg, Oral, Q6H PRN **OR** ondansetron (ZOFRAN) injection 4 mg, 4 mg,  Intravenous, Q6H PRN, Lisbeth Renshaw, MD .  potassium chloride (KLOR-CON) packet 20 mEq, 20 mEq, Oral, Daily, Lynnell Jude, MD, 20 mEq at 09/01/17 0900 .  ranitidine (ZANTAC) 150 MG/10ML syrup 150 mg, 150 mg, Per Tube, BID, Scarlett Presto, RPH, 150 mg at 09/01/17 0901  Patients Current Diet: DIET DYS 2 Room  service appropriate? Yes; Fluid consistency: Thin  Precautions / Restrictions Precautions Precautions: Fall Precaution Comments: clamp EVD prior to moving the bed/mobilizing pt.  Other Brace/Splint: bil mittens Restrictions Weight Bearing Restrictions: No   Has the patient had 2 or more falls or a fall with injury in the past year?No  Prior Activity Level Community (5-7x/wk): Went out daily.  worked PT as a Investment banker, operationalchef, was driving.  Home Assistive Devices / Equipment Home Assistive Devices/Equipment: None  Prior Device Use: Indicate devices/aids used by the patient prior to current illness, exacerbation or injury? None  Prior Functional Level Prior Function Level of Independence: Independent Comments: Presumably   Self Care: Did the patient need help bathing, dressing, using the toilet or eating?   Independent  Indoor Mobility: Did the patient need assistance with walking from room to room (with or without device)? Independent  Stairs: Did the patient need assistance with internal or external stairs (with or without device)? Independent  Functional Cognition: Did the patient need help planning regular tasks such as shopping or remembering to take medications? Independent  Current Functional Level Cognition  Arousal/Alertness: Awake/alert Overall Cognitive Status: Impaired/Different from baseline Current Attention Level: Sustained Orientation Level: Oriented to person, Disoriented to place, Disoriented to time, Disoriented to situation Following Commands: Follows one step commands inconsistently, Follows one step commands with increased  time Safety/Judgement: Decreased awareness of safety, Decreased awareness of deficits General Comments: pt with eyes open the whole session; the only attempt to verbalize was mouthing goodbye once asked her to say it; when I asked her to give me 5 she did and then when I immediately asked her to give the tech 5 (she counted out 5 fingers) both with her left hand Attention: Focused, Sustained Focused Attention: Appears intact Sustained Attention: Appears intact Memory: Impaired Memory Impairment: Storage deficit, Retrieval deficit Awareness: Impaired Awareness Impairment: Intellectual impairment Problem Solving: Impaired    Extremity Assessment (includes Sensation/Coordination)  Upper Extremity Assessment: Difficult to assess due to impaired cognition (limited RUE active movement noted)  Lower Extremity Assessment: Defer to PT evaluation    ADLs  Overall ADL's : Needs assistance/impaired Eating/Feeding: Supervision/ safety (thin liquids) Grooming: Wash/dry face Grooming Details (indicate cue type and reason): once washcloth placed in pt's LUE she was able to wash her eyes, wash her forehead and her left ear with cues for each part to wash; for RUE once washcloth placed and I helped her raise her left hand to her left ear, she washed her left ear Lower Body Bathing: Maximal assistance, Sit to/from stand, +2 for physical assistance Lower Body Dressing: Maximal assistance Lower Body Dressing Details (indicate cue type and reason): to don socks Toileting- Clothing Manipulation and Hygiene: Maximal assistance, +2 for physical assistance, Sit to/from stand Functional mobility during ADLs: +2 for physical assistance, Maximal assistance General ADL Comments: Pt able to maintain sitting EOB for brief periods with min A. Improved ability to maintain arousal once in chair    Mobility  Overal bed mobility: Needs Assistance Bed Mobility: Supine to Sit Rolling: Max assist Sidelying to sit: Max  assist Supine to sit: Total assist, +2 for physical assistance Sit to supine: +2 for physical assistance, Total assist General bed mobility comments: cues for sequence with pt reaching partially with RUE to rail to roll and unable to assist significantly. Assist to bring legs off of bed and elevate trunk to sitting. Pt with assist for balance EOB mod assist due to posterior lean    Transfers  Overall transfer level:  Needs assistance Equipment used: None Transfer via Lift Equipment: [employer] Transfers: Systems analyst Sit to Stand: Max assist, +2 physical assistance Stand pivot transfers: +2 physical assistance, Total assist General transfer comment: using bed pad under pt and gait belt for transfer (pt with tendency to lean posteriorly and left)    Ambulation / Gait / Stairs / Wheelchair Mobility  Ambulation/Gait General Gait Details: unable at this time.     Posture / Balance Dynamic Sitting Balance Sitting balance - Comments: pt with variable balance from min A to max A Balance Overall balance assessment: Needs assistance Sitting-balance support: Feet supported, Bilateral upper extremity supported Sitting balance-Leahy Scale: Zero Sitting balance - Comments: pt with variable balance from min A to max A Postural control: Posterior lean, Left lateral lean Standing balance support: Bilateral upper extremity supported Standing balance-Leahy Scale: Zero Standing balance comment: not buckling L knee    Special needs/care consideration BiPAP/CPAP No CPM No Continuous Drip IV KVO  Dialysis No        Life Vest No Oxygen No Special Bed No Trach Size No Wound Vac (area) No       Skin Post op incisions right head and abdomen.                               Bowel mgmt: Last BM 08/31/17 Bladder mgmt: External catheter Diabetic mgmt No    Previous Home Environment Home Care Services: No Additional Comments: Pt unable to provide history, and no family present in room at  the time of PT eval. Unsure what home environment is.  Discharge Living Setting Plans for Discharge Living Setting: Lives with (comment), Apartment (Dtr plans home to her apartment.) Type of Home at Discharge: Apartment Discharge Home Layout: Two level, Bed/bath upstairs Alternate Level Stairs-Number of Steps: 14 to upper level Discharge Home Access: Stairs to enter Entrance Stairs-Number of Steps: 4 step entry Does the patient have any problems obtaining your medications?: Yes (Describe)  Social/Family/Support Systems Patient Roles: Parent, Other (Comment) (Has 3 daughters, a son and a mom.  Had a boyfriend as well.) Contact Information: Arva Slaugh - daughter Anticipated Caregiver: Daughter Anticipated Caregiver's Contact Information: Ignacia Marvel - daughter - [phone] Ability/Limitations of Caregiver: Daughter does not work.  Has children, can assist. Caregiver Availability: 24/7 Discharge Plan Discussed with Primary Caregiver: Yes Is Caregiver In Agreement with Plan?: Yes Does Caregiver/Family have Issues with Lodging/Transportation while Pt is in Rehab?: No  Goals/Additional Needs Patient/Family Goal for Rehab: PT/OT min to mod assist, SLP supervision to min to mod assist goals Expected length of stay: 20-28 days Cultural Considerations: None Dietary Needs: Dysphagia 2, thin liquids with supplemental tube feedings per nasogastric tube feeding. Equipment Needs: TBD Special Service Needs: May need PEG placed if appetite does not improve Pt/Family Agrees to Admission and willing to participate: Yes Program Orientation Provided & Reviewed with Pt/Caregiver Including Roles  & Responsibilities: Yes  Decrease burden of Care through IP rehab admission: N/A  Possible need for SNF placement upon discharge: Not planned  Patient Condition: This patient's medical and functional status has changed since the consult dated: 08/28/14 in which the Rehabilitation Physician determined and  documented that the patient's condition is appropriate for intensive rehabilitative care in an inpatient rehabilitation facility. See "History of Present Illness" (above) for medical update. Functional changes are: Currently requiring total assist +2 for stand pivot transfers. Patient's medical and functional status update has been discussed with the  Rehabilitation physician and patient remains appropriate for inpatient rehabilitation. Will admit to inpatient rehab today.  Preadmission Screen Completed By: Lelon Frohlich, with brief updates by Fae Pippin, 09/01/2017 11:05 AM ______________________________________________________________________   Discussed status with Dr. Riley Kill on 09/01/17 at  1235 and received telephone approval for admission today.  Admission Coordinator:  Fae Pippin, time 1235/Date 09/01/17       Cosigned by: Ranelle Oyster, MD at 09/01/2017 1:04 PM  Revision History

## 2017-09-02 NOTE — Evaluation (Signed)
Physical Therapy Assessment and Plan  Patient Details  Name: Suzanne Brooks MRN: [id number] Date of Birth: Dec 27, 1975  PT Diagnosis: Abnormal posture, Abnormality of gait, Hemiplegia dominant, Hypotonia and Impaired cognition Rehab Potential: Good ELOS: 21-25 days    Today's Date: 09/02/2017 PT Individual Time: 0800-0916 PT Individual Time Calculation (min): 76 min    Problem List:  Patient Active Problem List   Diagnosis Date Noted  . Endotracheal tube present   . ICH (intracerebral hemorrhage) ([place]) 07/30/2017  . SAH (subarachnoid hemorrhage) ([place]) 07/30/2017  . Seizure-like activity ([place])   . Acute respiratory failure with hypoxemia [facility])     Past Medical History:  Past Medical History:  Diagnosis Date  . Hypertension   . Seizures ([place])    Past Surgical History:  Past Surgical History:  Procedure Laterality Date  . IR 3D INDEPENDENT WKST  07/30/2017  . IR ANGIO INTRA EXTRACRAN SEL INTERNAL CAROTID BILAT MOD SED  07/30/2017  . IR ANGIO VERTEBRAL SEL VERTEBRAL UNI R MOD SED  07/30/2017  . IR ANGIOGRAM FOLLOW UP STUDY  07/30/2017  . IR ANGIOGRAM FOLLOW UP STUDY  07/30/2017  . IR ANGIOGRAM FOLLOW UP STUDY  07/30/2017  . IR TRANSCATH/EMBOLIZ  07/30/2017  . LAPAROSCOPIC REVISION VENTRICULAR-PERITONEAL (V-P) SHUNT N/A 08/27/2017   Procedure: LAPAROSCOPIC REVISION VENTRICULAR-PERITONEAL (V-P) SHUNT;  Surgeon: Ralene Ok, MD;  Location: [place];  Service: General;  Laterality: N/A;  . RADIOLOGY WITH ANESTHESIA N/A 07/30/2017   Procedure: RADIOLOGY WITH ANESTHESIA/ DR. Bradly Chris;  Surgeon: Radiologist, Medication, MD;  Location: [place];  Service: Radiology;  Laterality: N/A;  . VENTRICULOPERITONEAL SHUNT N/A 08/27/2017   Procedure: SHUNT INSERTION VENTRICULAR-PERITONEAL;  Surgeon: Consuella Lose, MD;  Location: [place];  Service: Neurosurgery;  Laterality: N/A;    Assessment & Plan Clinical Impression: Patient is a 41 y.o.right handed femalewith history of hypertension as  well as reported seizures.On no prescription medications at time of admission. Per report patient independent prior to admission multiple family in the area with daughter planning to provide assistance.Presented 07/30/2017 with seizure requiring intubation. Cranial CT scan showed diffuse subarachnoid hemorrhage. 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 A continuous EEG showed generalized polymorphic delta slowing suggesting severe encephalopathy no seizure.Maintained on Keppra for seizure prophylaxis.Patient with persistent leukocytosis as well as fever with infectious disease consulted placed on broad-spectrum antibiotics. CSF cultures negative antibiotics completed. 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 Dysphagia #2 dietandthinliquid dietas well as nasogastric tube feeds for nutritional support due to lack of appetite.   Patient transferred to CIR on 09/01/2017 .   Patient currently requires max with mobility secondary to muscle weakness, impaired timing and sequencing, abnormal tone, unbalanced muscle activation, motor apraxia and decreased motor planning, decreased attention to right and decreased sitting balance, decreased standing balance, decreased postural control, hemiplegia and decreased balance strategies.  Prior to hospitalization, patient was modified independent  with mobility and lived with Family, Other (Comment) (mother and children) in a [place] home.  Home access is 12-15Stairs to enter.  Patient will benefit from skilled PT intervention to maximize safe functional mobility, minimize fall risk and decrease caregiver burden for planned discharge home with 24 hour assist.  Anticipate patient will benefit from follow up [facility] at  discharge.  PT - End of Session Activity Tolerance: Tolerates < 10 min activity, no significant change in vital signs Endurance Deficit: Yes PT  Assessment Rehab Potential (ACUTE/IP ONLY): Good PT Barriers to Discharge: Inaccessible home environment;Decreased caregiver support;Medical stability;Home environment access/layout;Medication compliance PT Patient demonstrates impairments in the following area(s): Balance;Behavior;Endurance;Motor;Nutrition;Pain;Perception;Safety;Sensory;Skin Integrity PT Transfers Functional Problem(s): Bed Mobility;Bed to Chair;Car;Furniture;Floor PT Locomotion Functional Problem(s): Ambulation;Wheelchair Mobility;Stairs PT Plan PT Intensity: Minimum of 1-2 x/day ,45 to 90 minutes PT Frequency: 5 out of 7 days PT Duration Estimated Length of Stay: 21-25 days  PT Treatment/Interventions: Ambulation/gait training;Cognitive remediation/compensation;Balance/vestibular training;Community reintegration;Discharge planning;Disease management/prevention;DME/adaptive equipment instruction;Functional electrical stimulation;Neuromuscular re-education;Pain management;Functional mobility training;Patient/family education;Psychosocial support;Skin care/wound management;Splinting/orthotics;Therapeutic Activities;Stair training;Therapeutic Exercise;UE/LE Strength taining/ROM;Visual/perceptual remediation/compensation;UE/LE Coordination activities;Wheelchair propulsion/positioning PT Transfers Anticipated Outcome(s): min assist with LRAD  PT Locomotion Anticipated Outcome(s): min assist for house hold distances.  PT Recommendation Follow Up Recommendations: Home health PT Patient destination: Home Equipment Recommended: Wheelchair (measurements);Wheelchair cushion (measurements);Rolling walker with 5" wheels  Skilled Therapeutic Intervention Pt received supine in bed and agreeable to PT. Supine>sit transfer with max assist and max cues for sequencing. PT instructed patient in PT  Evaluation and initiated treatment intervention; see below for results. PT educated patient in [place], rehab potential, rehab goals, and discharge recommendations. Pt unable to perform ambulation at time of eval [facility]. sitting BP, 105/71. Standing BP: 93/67. Currently requires max assist for all transfers as well as min assist for WC mobility with moderate cues for attention to task >30 sec. Pt returned to room and performed squat pivot transfer to bed with max assist. Sit>supine completed with mod assist and left supine in bed with call bell in reach and all needs met.      PT Evaluation Precautions/Restrictions Restrictions Weight Bearing Restrictions: No General   Vital Signs Pain Pain Assessment Pain Assessment: No/denies pain Pain Score: 0-No pain Home Living/Prior Functioning Home Living Available Help at Discharge: Family;Available PRN/intermittently Type of Home: Apartment Home Access: Stairs to enter Entrance Stairs-Number of Steps: 12-15 Entrance Stairs-Rails: Right;Left;Can reach both Home Layout: One level Bathroom Accessibility: Yes  Lives With: Family;Other (Comment) (mother and children) Prior Function  Able to Take Stairs?: Yes Driving: No Comments: inconsistent historian Vision/Perception  Vision - Assessment Additional Comments: Mild R inattention. able to look to the R with cues from PT.  Perception Perception: Impaired Inattention/Neglect: Does not attend to right visual field Praxis Praxis: Impaired Praxis Impairment Details: Initiation;Ideomotor  Cognition Overall Cognitive Status: Impaired/Different from baseline Orientation Level: Oriented to person;Oriented to place;Disoriented to situation;Disoriented to time Focused Attention: Appears intact Sustained Attention: Impaired Sustained Attention Impairment: Functional basic Memory: Impaired Awareness: Impaired Awareness Impairment: Intellectual impairment Problem Solving:  Impaired Behaviors: Lability Safety/Judgment: Impaired Sensation Sensation Light Touch: Impaired Detail Light Touch Impaired Details: Impaired RLE Proprioception: Appears Intact Coordination Gross Motor Movements are Fluid and Coordinated: No Fine Motor Movements are Fluid and Coordinated: Yes Coordination and Movement Description: Decreased coordination in the RLE. Motor  Motor Motor: Hemiplegia;Motor impersistence;Abnormal postural alignment and control Motor - Skilled Clinical Observations: R sided Hemiplegia LE>UE  Mobility Bed Mobility Bed Mobility: Rolling Right;Rolling Left;Supine to Sit;Sit to Supine Rolling Right: 3: Mod assist;4: Min assist Rolling Right Details: Verbal cues for technique;Verbal cues for precautions/safety;Verbal cues for safe use of DME/AE;Manual facilitation for weight shifting Rolling Left: 3: Mod assist Rolling Left Details: Visual cues/gestures for sequencing;Verbal cues for sequencing;Verbal cues for technique;Verbal cues for safe use of DME/AE;Manual facilitation for placement Supine to Sit: 2: Max assist Supine to Sit Details: Verbal cues for safe use of DME/AE;Verbal cues for technique;Verbal cues for precautions/safety;Manual facilitation for weight shifting;Manual facilitation for placement Sit to  Supine: 2: Max assist Sit to Supine - Details: Verbal cues for technique;Verbal cues for precautions/safety;Manual facilitation for weight shifting;Manual facilitation for placement Transfers Transfers: Yes Sit to Stand: 2: Max assist Sit to Stand Details: Verbal cues for technique;Verbal cues for precautions/safety;Manual facilitation for weight shifting;Manual facilitation for placement;Manual facilitation for weight bearing;Verbal cues for safe use of DME/AE Squat Pivot Transfers: 2: Max Teacher, English as a foreign language Transfer Details: Verbal cues for technique;Verbal cues for precautions/safety;Manual facilitation for weight shifting;Manual facilitation for  placement;Verbal cues for safe use of DME/AE;Manual facilitation for weight bearing Locomotion  Ambulation Ambulation: No Stairs / Additional Locomotion Stairs: No Wheelchair Mobility Wheelchair Mobility: Yes Wheelchair Assistance: 4: Advertising account executive Details: Verbal cues for technique;Verbal cues for precautions/safety;Manual facilitation for placement;Verbal cues for safe use of DME/AE Wheelchair Propulsion: Both upper extremities Wheelchair Parts Management: Needs assistance Distance: 33f with 2 rest breaks  Trunk/Postural Assessment  Cervical Assessment Cervical Assessment: Exceptions to WEx[facility] HospitalThoracic Assessment Thoracic Assessment: Exceptions to WCrichton Rehabilitation CenterLumbar Assessment Lumbar Assessment: Exceptions to WWellbrook Endoscopy Center PcPostural Control Postural Control: Deficits on evaluation  Balance Static Sitting Balance Static Sitting - Level of Assistance: 3: Mod assist Dynamic Sitting Balance Sitting balance - Comments: Max assist with any functional reaching without back support  Static Standing Balance Static Standing - Level of Assistance: 2: Max assist (UE support on PT ) Dynamic Standing Balance Dynamic Standing - Level of Assistance: 1: +1 Total assist (UE support on PT ) Extremity Assessment      RLE Assessment RLE Assessment: Exceptions to WBeltway Surgery Centers LLC Dba Meridian South Surgery CenterRLE Strength RLE Overall Strength Comments: Grossly 3-/5 to 3/5 proximal to distal.  LLE Assessment LLE Assessment: Within Functional Limits   See Function Navigator for Current Functional Status.   Refer to Care Plan for Long Term Goals  Recommendations for other services: Neuropsych and Therapeutic Recreation  Stress management  Discharge Criteria: Patient will be discharged from PT if patient refuses treatment 3 consecutive times without medical reason, if treatment goals not met, if there is a change in medical status, if patient makes no progress towards goals or if patient is discharged from hospital.  The above  assessment, treatment plan, treatment alternatives and goals were discussed and mutually agreed upon: by patient  ALorie Phenix10/17/2018, 10:47 AM

## 2017-09-02 NOTE — Progress Notes (Signed)
Subjective/Complaints:   Objective: Vital Signs: Blood pressure 119/78, pulse (!) 102, temperature 98.9 F (37.2 C), temperature source Oral, resp. rate 17, height 5' 3"  (1.6 m), weight 63.6 kg (140 lb 3.4 oz), SpO2 99 %. No results found. Results for orders placed or performed during the hospital encounter of 09/01/17 (from the past 72 hour(s))  CBC WITH DIFFERENTIAL     Status: Abnormal   Collection Time: 09/02/17  5:38 AM  Result Value Ref Range   WBC 12.3 (H) 4.0 - 10.5 K/uL   RBC 3.07 (L) 3.87 - 5.11 MIL/uL   Hemoglobin 8.4 (L) 12.0 - 15.0 g/dL    Comment: CONSISTENT WITH PREVIOUS RESULT   HCT 26.8 (L) 36.0 - 46.0 %   MCV 87.3 78.0 - 100.0 fL   MCH 27.4 26.0 - 34.0 pg   MCHC 31.3 30.0 - 36.0 g/dL   RDW 20.8 (H) 11.5 - 15.5 %   Platelets 423 (H) 150 - 400 K/uL    Comment: REPEATED TO VERIFY   Neutrophils Relative % 66 %   Lymphocytes Relative 20 %   Monocytes Relative 7 %   Eosinophils Relative 7 %   Basophils Relative 0 %   Neutro Abs 8.0 (H) 1.7 - 7.7 K/uL   Lymphs Abs 2.5 0.7 - 4.0 K/uL   Monocytes Absolute 0.9 0.1 - 1.0 K/uL   Eosinophils Absolute 0.9 (H) 0.0 - 0.7 K/uL   Basophils Absolute 0.0 0.0 - 0.1 K/uL   Smear Review MORPHOLOGY UNREMARKABLE   Comprehensive metabolic panel     Status: None   Collection Time: 09/02/17  5:38 AM  Result Value Ref Range   Sodium 138 135 - 145 mmol/L   Potassium 4.0 3.5 - 5.1 mmol/L   Chloride 101 101 - 111 mmol/L   CO2 27 22 - 32 mmol/L   Glucose, Bld 95 65 - 99 mg/dL   BUN 18 6 - 20 mg/dL   Creatinine, Ser 0.49 0.44 - 1.00 mg/dL   Calcium 9.9 8.9 - 10.3 mg/dL   Total Protein 7.6 6.5 - 8.1 g/dL   Albumin 3.5 3.5 - 5.0 g/dL   AST 29 15 - 41 U/L   ALT 54 14 - 54 U/L   Alkaline Phosphatase 78 38 - 126 U/L   Total Bilirubin 0.7 0.3 - 1.2 mg/dL   GFR calc non Af Amer >60 >60 mL/min   GFR calc Af Amer >60 >60 mL/min    Comment: (NOTE) The eGFR has been calculated using the CKD EPI equation. This calculation has not been  validated in all clinical situations. eGFR's persistently <60 mL/min signify possible Chronic Kidney Disease.    Anion gap 10 5 - 15     HEENT: Staples over ventriculostomy site and crani site Cardio: RRR and no murmur Resp: CTA B/L GI: BS positive and staple sover RUQ from VP shunt placement Extremity:  No Edema Skin:   Other RIght upper chest wound from tunneled cath, CDI Neuro: Lethargic, Flat, Cranial Nerve II-XII normal, Abnormal Sensory reduced grimace to pinch RLE, Abnormal Motor 4/5 B delt, bi, tri, grip, 2- R HF, KE Ankle PF, 0 ankle DF, Reflexes: 3+ and Other poor attention to task, (`5sec) Musc/Skel:  Other no pain with UE or LE ROM Gen NAD   Assessment/Plan: 1. Functional deficits secondary to [facility] with cognitive deficits and gait disorder, possible RLE >LLE weakness  which require 3+ hours per day of interdisciplinary therapy in a comprehensive inpatient rehab setting. Physiatrist is providing close  team supervision and 24 hour management of active medical problems listed below. Physiatrist and rehab team continue to assess barriers to discharge/monitor patient progress toward functional and medical goals. FIM:                   Function - Comprehension Comprehension: Auditory Comprehension assist level: Follows complex conversation/direction with extra time/assistive device  Function - Expression Expression: Verbal Expression assist level: Expresses complex ideas: With extra time/assistive device  Function - Social Interaction Social Interaction assist level: Interacts appropriately with others - No medications needed.        Medical Problem List and Plan: 1. Decreased functional mobility and cognitive deficits secondary to [facility] with subsequent hydrocephalus and placement of the VPS -CIR PT, OT, SLP 2. DVT Prophylaxis/Anticoagulation: SCDs 3. Pain Management: Tylenol as needed 4. Mood: Provide emotional support 5. Neuropsych: This  patient is notcapable of making decisions on herown behalf. -mittens to prevent disruption of equipment -fall safety precautions Trial methylphenidate  6. Skin/Wound Care: Routine skin checks 7. Fluids/Electrolytes/Nutrition: Routine I&O with follow-up chemistries 8.Seizure disorder.Keppra 1000 mg twice a day 9.Dysphagia. Dysphagia #2 thin liquids/nasogastric tube feeds to supplement---remove when able to adequately meet nutritional needs--follow daily I's and O's. 10.Hypertension.Labetaolol 200 mg BID -DBP elevated particularly.  -check bp's q shift and with therapy.   Vitals:   09/01/17 1657 09/02/17 0522  BP: 126/78 119/78  Pulse: (!) 105 (!) 102  Resp: 16 17  Temp: (!) 97.5 F (36.4 C) 98.9 F (37.2 C)  SpO2: 100% 99%   11.Leukocytosis. Improved. CSF negative. Infectious disease follow-up as needed.All antibiotics have since been discontinued  LOS (Days) 1 A FACE TO FACE EVALUATION WAS PERFORMED  KIRSTEINS,ANDREW E 09/02/2017, 10:07 AM

## 2017-09-02 NOTE — Evaluation (Signed)
Speech Language Pathology Assessment and Plan  Patient Details  Name: Suzanne Brooks MRN: [id number] Date of Birth: [birth year]  SLP Diagnosis: Cognitive Impairments;Dysphagia  Rehab Potential: Good ELOS: 3 weeks    Today's Date: 09/02/2017 SLP Individual Time: 1000-1100 SLP Individual Time Calculation (min): 60 min   Problem List:  Patient Active Problem List   Diagnosis Date Noted  . Endotracheal tube present   . ICH (intracerebral hemorrhage) ([place]) 07/30/2017  . SAH (subarachnoid hemorrhage) ([place]) 07/30/2017  . Seizure-like activity ([place])   . Acute respiratory failure with hypoxemia [facility])    Past Medical History:  Past Medical History:  Diagnosis Date  . Hypertension   . Seizures ([place])    Past Surgical History:  Past Surgical History:  Procedure Laterality Date  . IR 3D INDEPENDENT WKST  07/30/2017  . IR ANGIO INTRA EXTRACRAN SEL INTERNAL CAROTID BILAT MOD SED  07/30/2017  . IR ANGIO VERTEBRAL SEL VERTEBRAL UNI R MOD SED  07/30/2017  . IR ANGIOGRAM FOLLOW UP STUDY  07/30/2017  . IR ANGIOGRAM FOLLOW UP STUDY  07/30/2017  . IR ANGIOGRAM FOLLOW UP STUDY  07/30/2017  . IR TRANSCATH/EMBOLIZ  07/30/2017  . LAPAROSCOPIC REVISION VENTRICULAR-PERITONEAL (V-P) SHUNT N/A 08/27/2017   Procedure: LAPAROSCOPIC REVISION VENTRICULAR-PERITONEAL (V-P) SHUNT;  Surgeon: Ralene Ok, MD;  Location: [place];  Service: General;  Laterality: N/A;  . RADIOLOGY WITH ANESTHESIA N/A 07/30/2017   Procedure: RADIOLOGY WITH ANESTHESIA/ DR. Bradly Chris;  Surgeon: Radiologist, Medication, MD;  Location: [place];  Service: Radiology;  Laterality: N/A;  . VENTRICULOPERITONEAL SHUNT N/A 08/27/2017   Procedure: SHUNT INSERTION VENTRICULAR-PERITONEAL;  Surgeon: Consuella Lose, MD;  Location: [place];  Service: Neurosurgery;  Laterality: N/A;    Assessment / Plan / Recommendation Clinical Impression Suzanne Brooks a 41 y.o.right handed femalewith history of hypertension as well as reported seizures.On no  prescription medications at time of admission. Per report patient independent prior to admission multiple family in the area with daughter planning to provide assistance.Presented 07/30/2017 with seizure requiring intubation. Cranial CT scan showed diffuse subarachnoid hemorrhage. 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 A continuous EEG showed generalized polymorphic delta slowing suggesting severe encephalopathy no seizure.Maintained on Keppra for seizure prophylaxis.Patient with persistent leukocytosis as well as fever with infectious disease consulted placed on broad-spectrum antibiotics. CSF cultures negative antibiotics completed. 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 Dysphagia #2 dietandthinliquid dietas well as nasogastric tube feeds for nutritional support due to lack of appetite. Physical and occupationaltherapy evaluationscompleted with recommendations of physical medicine rehabilitation consult. Patient was admitted for a comprehensive rehabilitation program on 09/01/17.   Comprehensive cognitive linguistic and bedisde swallow evaluations were completed on 09/02/17. Pt is currently on dysphagia 2 diet with thin liquids. Rationale for dysphagia 2 was to increase PO intake - not because pt doesn't demonstrate needed oral abilities on dysphagia 3. During this evaluation, pt refused food textures but willing to consume Ensure. Pt consumed via straw with 1 delayed throat clear possibly related to multiple consecutive sips. Pt unable to provide rationale for refusing PO intake. Question possible cognitive component (i.e., decreased awareness of need to eat). In addiiton pt presents with moderate cognitive deficits that impact orientation,  increased processing times with verbal responses, decreased task initiation, sustained attention, memory impairments in storage and retrieval, intellectual awareness and overall safety. Pt also demonstrates decreased speech intelligibility d/t decreased vocal intensity  and is ~ 50% intelligible at the phrase level.Pt is able to follow 1 to 2 step simple directions and answer simple yes/no questions. Skilled ST is required to address the above mentioned deficits to increase functional independence and reduce caregiver burden. Anticipate that pt may require additional sservices at SNF upon discharge from CIR with follow up ST services.   Skilled Therapeutic Interventions          Skilled treatment session focused on completion of BSE and cognitive linguistic evaluations, see above. Pt required Max A to Mod A cues to increase vocal intensity, question cognitive impact on speech intelligibility. Despite Max A pt with refusal of food trials. Recommend continuing dysphagia 2 with thin liquids, full nursing supervision.    SLP Assessment  Patient will need skilled Speech Lanaguage Pathology Services during CIR admission    Recommendations  SLP Diet Recommendations: Dysphagia 2 (Fine chop);Thin Liquid Administration via: Cup;Straw Medication Administration: Whole meds with puree Supervision: Staff to assist with self feeding;Full supervision/cueing for compensatory strategies Compensations: Slow rate;Small sips/bites;Minimize environmental distractions;Monitor for anterior loss;Follow solids with liquid Postural Changes and/or Swallow Maneuvers: Seated upright 90 degrees Oral Care Recommendations: Oral care BID Recommendations for Other Services: Neuropsych consult Patient destination: [place] (SNF) Follow up Recommendations: 24 hour supervision/assistance;Skilled Nursing facility Equipment Recommended: None recommended by SLP    SLP Frequency 3 to 5 out of 7 days   SLP Duration  SLP  Intensity  SLP Treatment/Interventions 3 weeks  Minumum of 1-2 x/day, 30 to 90 minutes  Cognitive remediation/compensation;Cueing hierarchy;Functional tasks;Patient/family education;Internal/external aids;Dysphagia/aspiration precaution training;Medication managment;Therapeutic Activities    Pain Pain Assessment Pain Assessment: No/denies pain Pain Score: 0-No pain  Prior Functioning Cognitive/Linguistic Baseline: Within functional limits Type of Home: Apartment  Lives With: Family;Other (Comment) (Mother and children) Available Help at Discharge: Family;Available PRN/intermittently Vocation: Full time employment  Function:  Eating Eating   Modified Consistency Diet: Yes Eating Assist Level: More than reasonable amount of time;Supervision or verbal cues           Cognition Comprehension Comprehension assist level: Understands basic 50 - 74% of the time/ requires cueing 25 - 49% of the time  Expression   Expression assist level: Expresses basic 25 - 49% of the time/requires cueing 50 - 75% of the time. Uses single words/gestures.;Expresses basic 50 - 74% of the time/requires cueing 25 - 49% of the time. Needs to repeat parts of sentences.  Social Interaction Social Interaction assist level: Interacts appropriately 75 - 89% of the time - Needs redirection for appropriate language or to initiate interaction.  Problem Solving Problem solving assist level: Solves basic 25 - 49% of the time - needs direction more than half the time to initiate, plan or complete simple activities  Memory Memory assist level: Recognizes or recalls 25 - 49% of the time/requires cueing 50 - 75% of the time   Short Term Goals: Week 1: SLP Short Term Goal 1 (Week 1): Pt will sustain attention to basic task for ~ 15 minutes with Min A cues.  SLP Short Term Goal 2 (Week 1): Pt will initiate basic familiar task within 50% of opportunities and Mod A cues.  SLP Short Term Goal 3 (Week 1): Pt will verbally  respond to question in timely manner in 50% of opportunities and Mod A cues.  SLP Short Term Goal 4 (Week 1): Pt will increase vocal intensity to achieve ~ 75% intelligibility at the sentence level with Mod A cues.  SLP Short Term Goal  5 (Week 1): Pt will demonstrate consistent orientation x 4 with Min A cues.  SLP Short Term Goal 6 (Week 1): Pt will consume dysphagia 2 diet with thin liquids and minimal overt s/s of aspiration with supervision cues for use of compensatory swallow strategies.   Refer to Care Plan for Long Term Goals  Recommendations for other services: Neuropsych  Discharge Criteria: Patient will be discharged from SLP if patient refuses treatment 3 consecutive times without medical reason, if treatment goals not met, if there is a change in medical status, if patient makes no progress towards goals or if patient is discharged from hospital.  The above assessment, treatment plan, treatment alternatives and goals were discussed and mutually agreed upon: by patient  Muhanad Torosyan 09/02/2017, 11:31 AM

## 2017-09-02 NOTE — Progress Notes (Signed)
Ranelle OysterSwartz, Zachary T, MD Physician Signed Physical Medicine and Rehabilitation  Consult Note Date of Service: 08/28/2017 10:13 AM  Related encounter: ED to Hosp-Admission (Discharged[facility]GICAL [facility]     Expand All Collapse All   [] Hide copied text [] Hover for attribution information      Physical Medicine and Rehabilitation Consult Reason for Consult: decreased functional mobility Referring Physician: Dr. Conchita ParisNundkumar   HPI: Suzanne Brooks is a 41 y.o.right handed female with history of hypertension as well as reported seizures.On no prescription medications at time of admission.Presented 07/30/2017 with seizure requiring intubation.  Cranial CT scan showed diffuse subarachnoid hemorrhage. 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 A continuous EEG showed generalized polymorphic delta slowing suggesting severe encephalopathy no seizure. Patient with persistent leukocytosis as well as fever with infectious disease consulted placed on broad-spectrum antibiotics. CSF cultures negative antibiotics completed. 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 Dysphagia #2 diet. Physical therapy evaluation completed with recommendations of physical medicine rehabilitation consult.   Review of Systems  Unable to perform ROS: Acuity of condition       Past Medical History:  Diagnosis Date  . Hypertension   . Seizures ([facility])         Past Surgical History:  Procedure Laterality Date  . IR 3D INDEPENDENT WKST  07/30/2017  . IR ANGIO INTRA EXTRACRAN SEL INTERNAL CAROTID BILAT MOD SED  07/30/2017  . IR ANGIO VERTEBRAL SEL VERTEBRAL UNI R MOD SED  07/30/2017  . IR ANGIOGRAM FOLLOW UP STUDY  07/30/2017  .  IR ANGIOGRAM FOLLOW UP STUDY  07/30/2017  . IR ANGIOGRAM FOLLOW UP STUDY  07/30/2017  . IR TRANSCATH/EMBOLIZ  07/30/2017  . RADIOLOGY WITH ANESTHESIA N/A 07/30/2017   Procedure: RADIOLOGY WITH ANESTHESIA/ DR. Gerlene BurdockNUNDKUMAR/IR;  Surgeon: Radiologist, Medication, MD;  Location: [facility];  Service: Radiology;  Laterality: N/A;        Family History  Problem Relation Age of Onset  . Stroke Maternal Aunt   . Aneurysm Maternal Aunt   . Aneurysm Cousin        died after aneurysm   Social History:  reports that she has never smoked. She has never used smokeless tobacco. She reports that she drinks alcohol. She reports that she uses drugs, including Marijuana. Allergies: No Known Allergies No prescriptions prior to admission.    Home: Home Living Family/patient expects to be discharged to:: Inpatient rehab Additional Comments: Pt unable to provide history, and no family present in room at the time of PT eval. Unsure what home environment is.  Functional History: Prior Function Level of Independence: Independent Comments: Presumably  Functional Status:  Mobility: Bed Mobility Overal bed mobility: Needs Assistance Bed Mobility: Rolling, Sidelying to Sit Rolling: Total assist Sidelying to sit: Max assist Supine to sit: +2 for physical assistance, Total assist, HOB elevated Sit to supine: +2 for physical assistance, Total assist General bed mobility comments: cues for sequence with pt not providing significant assist to roll. She assisted with LUE for pushing from side to sit with assist for balance in sitting as pt leaning posteriorly. Sitting EOB cues and knee blocked as pt pushing posteriorly and sliding on bed with repositioning posteriorly EOB to prevent sliding off.  Transfers Overall transfer level: Needs assistance Equipment used: None Transfer via Lift Equipment: [employer] Transfers: Sit to/from Stand, Pharmacologisttand Pivot Transfers Sit to  Stand: Max assist, +2 physical assistance Stand  pivot transfers: Max assist, +2 physical assistance General transfer comment: cues for sequence with pt support at trunk with RLE blocked and assisted to rise with max assist to control RLE and pivot pelvis to chair. Total assist +2 to scoot back in chair x 2 trials with assist of pad.  Ambulation/Gait General Gait Details: unable at this time.   ADL: ADL Overall ADL's : Needs assistance/impaired Eating/Feeding: NPO Grooming: Wash/dry face, Maximal assistance, Sitting Grooming Details (indicate cue type and reason): hand over hand assist to wash face but then able to continue task for a few moments without assist. Lower Body Bathing: Maximal assistance, Sit to/from stand, +2 for physical assistance Lower Body Dressing: Maximal assistance Lower Body Dressing Details (indicate cue type and reason): to don socks Toileting- Clothing Manipulation and Hygiene: Maximal assistance, +2 for physical assistance, Sit to/from stand Functional mobility during ADLs: Total assistance, +2 for physical assistance General ADL Comments: Total assistance this session.   Cognition: Cognition Overall Cognitive Status: Impaired/Different from baseline Arousal/Alertness: Awake/alert Orientation Level: Oriented to person Attention: Focused, Sustained Focused Attention: Appears intact Sustained Attention: Appears intact Memory: Impaired Memory Impairment: Storage deficit, Retrieval deficit Awareness: Impaired Awareness Impairment: Intellectual impairment Problem Solving: Impaired Cognition Arousal/Alertness: Awake/alert Behavior During Therapy: Flat affect Overall Cognitive Status: Impaired/Different from baseline Area of Impairment: Attention, Following commands, Orientation Orientation Level: Disoriented to, Time Current Attention Level: Focused Memory: Decreased short-term memory Following Commands: Follows one step commands inconsistently Safety/Judgement: Decreased awareness of safety, Decreased  awareness of deficits Awareness: Intellectual Problem Solving: Slow processing, Decreased initiation, Difficulty sequencing, Requires tactile cues, Requires verbal cues General Comments: pt with eyes open, able to state "May" "1977" for orientation but otherwise not responding to questions. Singing something at times, pt tracking movement throughout session  Blood pressure (!) 142/104, pulse 95, temperature 98.5 F (36.9 C), temperature source Oral, resp. rate 14, height 5\' 3"  (1.6 m), weight 65 kg (143 lb 4.8 oz), SpO2 100 %. Physical Exam  Constitutional:  VP craniotomy sites clean and dry  HENT:  Nasogastric tube in place  Eyes:  Pupils sluggish but reactive to light  Neck: Normal range of motion. Neck supple. No thyromegaly present.  Cardiovascular: Normal rate, regular rhythm and normal heart sounds.   Respiratory:  Limited inspiratory effort but clear to auscultation  GI: Soft. Bowel sounds are normal. She exhibits no distension.  Neurological:  Patient is very lethargic. She would squeeze hand on exam and move to painful stimulation. She did attempt to mouth a few words. Eyes are closed.   Psychiatric:  lethargic    Lab Results Last 24 Hours  No results found for this or any previous visit (from the past 24 hour(s)).   Imaging Results (Last 48 hours)  No results found.    Assessment/Plan: Diagnosis: SAH with subsequent hydrocephalus and placement of VPS 1. Does the need for close, 24 hr/day medical supervision in concert with the patient's rehab needs make it unreasonable for this patient to be served in a less intensive setting? Yes and Potentially 2. Co-Morbidities requiring supervision/potential complications: Dysphagia, morbid obesity.  3. Due to bladder management, bowel management, safety, skin/wound care, disease management, medication administration, pain management and patient education, does the patient require 24 hr/day rehab nursing? Yes 4. Does the patient  require coordinated care of a physician, rehab nurse, PT (1-2 hrs/day, 5 days/week), OT (1-2 hrs/day, 5 days/week) and SLP (1-2 hrs/day, 5 days/week) to address physical and functional  deficits in the context of the above medical diagnosis(es)? Yes Addressing deficits in the following areas: balance, endurance, locomotion, strength, transferring, bowel/bladder control, bathing, dressing, feeding, grooming and psychosocial support 5. Can the patient actively participate in an intensive therapy program of at least 3 hrs of therapy per day at least 5 days per week? Potentially 6. The potential for patient to make measurable gains while on inpatient rehab is excellent 7. Anticipated functional outcomes upon discharge from inpatient rehab are min assist and mod assist  with PT, min assist and mod assist with OT, supervision, min assist and mod assist with SLP. 8. Estimated rehab length of stay to reach the above functional goals is: potentially 20-28 days 9. Anticipated D/C setting: Home 10. Anticipated post D/C treatments: HH therapy and Outpatient therapy 11. Overall Rehab/Functional Prognosis: excellent  RECOMMENDATIONS: This patient's condition is appropriate for continued rehabilitative care in the following setting: CIR Patient has agreed to participate in recommended program. N/A Note that insurance prior authorization may be required for reimbursement for recommended care.  Comment: Rehab Admissions Coordinator to follow up.  Thanks,  Ranelle Oyster, MD, [place] [facility]    Charlton Amor., PA-C 08/28/2017    Revision History                        Routing History

## 2017-09-02 NOTE — Progress Notes (Signed)
Pt resting in bed quietly. Easily aroused. Incontinent of b/b. Verbal when chooses. Soft mitts on to discourage pulling at NG tube in R Nare that has vital infusing at 55cc/hr. Pt moves all extremities continuously and attempts to scratch at upper extremities. Moisturizing lotion was applied. Pt would benefit from hydrophor oinment to assist in moisturizing skin and prevent pruritis from its drying. Multiple pink patches of varying sizes on buttocks that appear to be scar tissue. Barrier cream applied after incontinent / peri care. Without any noted s/s of pain via grimace, groan or moan. Requires total assist for meals and ADLs. callbell within reach. Safety maintained. Will continue to monitor.

## 2017-09-02 NOTE — Progress Notes (Addendum)
Patients mother called to check on patient because she heard she was moved to another unit.  Mother updated on patients basic status in rehab.  Mother perseverated on finances asking "will she be getting a disability check,"  "will she be getting her Medicaid fixed", "who do I need to call to find out if she will be getting a disability check"? She also states the patient will be coming to live with her temporarily when she is discharged.  She informed RN of steps to get into home. Informed patient about mother calling to check on her.  She replied "you should have asked her why she hasn't been up here to visit me" and I told her that her mother would be attempting to come visit tomorrow if she could get a ride. The patient then smirked and said "WIC is what she wants" and gesturing with the money sign.  Informed SW of conversation.  Dani Gobbleeardon, Maydelin Deming J, RN

## 2017-09-03 ENCOUNTER — Inpatient Hospital Stay (HOSPITAL_COMMUNITY): Payer: Medicaid Other | Admitting: Occupational Therapy

## 2017-09-03 ENCOUNTER — Inpatient Hospital Stay (HOSPITAL_COMMUNITY): Payer: Medicaid Other | Admitting: Speech Pathology

## 2017-09-03 ENCOUNTER — Inpatient Hospital Stay (HOSPITAL_COMMUNITY): Payer: Medicaid Other | Admitting: Physical Therapy

## 2017-09-03 DIAGNOSIS — S06350S Traumatic hemorrhage of left cerebrum without loss of consciousness, sequela: Secondary | ICD-10-CM

## 2017-09-03 MED ORDER — VITAL 1.5 CAL PO LIQD
1000.0000 mL | ORAL | Status: DC
  Administered 2017-09-03 – 2017-09-13 (×13): 1000 mL
  Filled 2017-09-03 (×13): qty 1000

## 2017-09-03 NOTE — Progress Notes (Signed)
Physical Therapy Session Note  Patient Details  Name: Suzanne Brooks MRN: [id number] Date of Birth: 09-15-1976  Today's Date: 09/03/2017 PT Individual Time: 3958-4417   12 min   Short Term Goals: Week 1:  PT Short Term Goal 1 (Week 1): Pt will transfers with moderate assist from PT  PT Short Term Goal 2 (Week 1): Pt will ambulate 31f with max assist  PT Short Term Goal 3 (Week 1): Pt will maintain standing balance for 1 minute with moderate assist  PT Short Term Goal 4 (Week 1): Pt will perform bed mobility with moderate assist   Skilled Therapeutic Interventions/Progress Updates:   Pt received sitting in WC and agreeable to PT  PT measured pt's BP. 139/88. Gait training at rail in hall x 157fwith max assist +2 for WC following and max cues from PT for attention to task.   PT instructed pt in Nustep reciprocal movement training x 5 minutes with at level 3. PT required to provide max cues for attention to task and improved use of RLE. Pt reports that her head hurts following 5 minutes on Nustep. BP taken 145/102. PT allowed pt to rest 1 min and re-assessed BP 114/100. Pt transferred to recliner from nustep.   Pt instructed to initiation task to place blocks on specific colored squares, 9 block x2. Moderate cues for awareness of error and how to correct.   Patient returned to RN station and left sitting in recliner with call bell in reach and all needs met.        Therapy Documentation Precautions:  Precautions Precautions: Fall Restrictions Weight Bearing Restrictions: No    Vital Signs: Therapy Vitals Temp: 98.6 F (37 C) Temp Source: Oral Pulse Rate: 99 Resp: 15 BP: (!) 127/92 Patient Position (if appropriate): Sitting Oxygen Therapy SpO2: 100 % O2 Device: Not Delivered Pain: 0/10 at rest  See Function Navigator for Current Functional Status.   Therapy/Group: Individual Therapy  AuLorie Phenix0/18/2018, 5:46 PM

## 2017-09-03 NOTE — Discharge Summary (Signed)
  Physician Discharge Summary  Patient ID: Suzanne Brooks MRN: [id number] DOB/AGE: March 14, 1976 41 y.o.  Admit date: 07/30/2017 Discharge date: 09/03/2017  Admission Diagnoses:  Subarachnoid Hemorrhage  Discharge Diagnoses:  Same Active Problems:   ICH (intracerebral hemorrhage) (HCC)   SAH (subarachnoid hemorrhage) (HCC)   Seizure-like activity (HCC)   Acute respiratory failure with hypoxemia (HCC)   Endotracheal tube present   Discharged Condition: Stable  Hospital Course:  Suzanne Brooks is a 41 y.o. female nitially admitted through the emergency department after sudden onset of headache and seizure. 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 She underwent coil embolization. External ventricular drain was placed the following day. She was subsequent transferred to the intensive care unit for further observation. She was able to be extubated. During the course of her hospital stay, she was noted to have symptomatic vasospasm for which she was treated with hyperdynamic therapy, and appeared to respond well. Attempts werer made to wean the drain unsuccessfully. Repeat CT scan did demonstrate progressive ventriculomegaly, and clinically the patient was more somnolent suggestive of development of hydrocephalus. She therefore underwent placement of a right ventricular perineal shunt with laparoscopic guidance with the assistance of general surgery. Subsequent to this, she was stable for transfer to inpatient rehabilitation.  Treatments: Surgery - Coiling left paraophthalmic ICA aneurysm  Discharge Exam: Blood pressure 106/78, pulse (!) 105, temperature 98.8 F (37.1 C), temperature source Oral, resp. rate 17, height 5\' 3"  (1.6 m), weight 60.7 kg (133 lb 13.1 oz), SpO2 99 %. Awake, alert, oriented Speech fluent CN grossly intact Good  strength BUE, LLE No movement RLE  Disposition: 90-DC/txfr to inpt rehab facility with planned acute care hosp IP admission   Allergies as of 09/01/2017   No Known Allergies     Medication List    You have not been prescribed any medications.      SignedLisbeth Renshaw: Deondrea Markos, C 09/03/2017, 8:04 AM

## 2017-09-03 NOTE — Progress Notes (Signed)
Occupational Therapy Session Note  Patient Details  Name: Suzanne Brooks MRN: [id number] Date of Birth: [birth year]  Today's Date: 09/03/2017 OT Individual Time: 1010-1040 OT Individual Time Calculation (min): 30 min    Short Term Goals: Week 1:  OT Short Term Goal 1 (Week 1): Pt will sit EOB with min A during bathing task OT Short Term Goal 2 (Week 1): Pt will complete sit <> stand at sink with max A +1 in prep for functional task OT Short Term Goal 3 (Week 1): Pt will complete set-up of oral care task with no more than 1 VC for sequencing  Skilled Therapeutic Interventions/Progress Updates:    1:! Pt requesting shower and toileting when arrived. Pt transferred with max cues for body postioning and transfer onto toilet from w/c and mod A back to chair. Pt showered with mod A while remaining seated. Pt continues to present with slow processing of all motor commands and verbal responses. Pt automatically stood with min A to transfer out of hte shower but required mod A for pivoting into the w/c.  Dressed sit to stand at sin. With faciliation and support of left Le pt able to come into standing with mod A with extra time.  Left pt with RN at end of session.   Therapy Documentation Precautions:  Precautions Precautions: Fall Restrictions Weight Bearing Restrictions: No Pain:  no c/o pain in session   See Function Navigator for Current Functional Status.   Therapy/Group: Individual Therapy  Roney MansSmith, Iesha [place] [facility] 09/03/2017, 2:35 PM

## 2017-09-03 NOTE — Progress Notes (Signed)
Subjective/Complaints: Limited verbal, long latency of response, "are you here to tamper with me?"  ROS-  No CP, SOB, N/V/D Objective: Vital Signs: Blood pressure 135/90, pulse 98, temperature 98.5 F (36.9 C), temperature source Oral, resp. rate 18, height 5' 3"  (1.6 m), weight 63.5 kg (140 lb 1.6 oz), SpO2 99 %. No results found. Results for orders placed or performed during the hospital encounter of 09/01/17 (from the past 72 hour(s))  CBC WITH DIFFERENTIAL     Status: Abnormal   Collection Time: 09/02/17  5:38 AM  Result Value Ref Range   WBC 12.3 (H) 4.0 - 10.5 K/uL   RBC 3.07 (L) 3.87 - 5.11 MIL/uL   Hemoglobin 8.4 (L) 12.0 - 15.0 g/dL    Comment: CONSISTENT WITH PREVIOUS RESULT   HCT 26.8 (L) 36.0 - 46.0 %   MCV 87.3 78.0 - 100.0 fL   MCH 27.4 26.0 - 34.0 pg   MCHC 31.3 30.0 - 36.0 g/dL   RDW 20.8 (H) 11.5 - 15.5 %   Platelets 423 (H) 150 - 400 K/uL    Comment: REPEATED TO VERIFY   Neutrophils Relative % 66 %   Lymphocytes Relative 20 %   Monocytes Relative 7 %   Eosinophils Relative 7 %   Basophils Relative 0 %   Neutro Abs 8.0 (H) 1.7 - 7.7 K/uL   Lymphs Abs 2.5 0.7 - 4.0 K/uL   Monocytes Absolute 0.9 0.1 - 1.0 K/uL   Eosinophils Absolute 0.9 (H) 0.0 - 0.7 K/uL   Basophils Absolute 0.0 0.0 - 0.1 K/uL   Smear Review MORPHOLOGY UNREMARKABLE   Comprehensive metabolic panel     Status: None   Collection Time: 09/02/17  5:38 AM  Result Value Ref Range   Sodium 138 135 - 145 mmol/L   Potassium 4.0 3.5 - 5.1 mmol/L   Chloride 101 101 - 111 mmol/L   CO2 27 22 - 32 mmol/L   Glucose, Bld 95 65 - 99 mg/dL   BUN 18 6 - 20 mg/dL   Creatinine, Ser 0.49 0.44 - 1.00 mg/dL   Calcium 9.9 8.9 - 10.3 mg/dL   Total Protein 7.6 6.5 - 8.1 g/dL   Albumin 3.5 3.5 - 5.0 g/dL   AST 29 15 - 41 U/L   ALT 54 14 - 54 U/L   Alkaline Phosphatase 78 38 - 126 U/L   Total Bilirubin 0.7 0.3 - 1.2 mg/dL   GFR calc non Af Amer >60 >60 mL/min   GFR calc Af Amer >60 >60 mL/min    Comment:  (NOTE) The eGFR has been calculated using the CKD EPI equation. This calculation has not been validated in all clinical situations. eGFR's persistently <60 mL/min signify possible Chronic Kidney Disease.    Anion gap 10 5 - 15     HEENT: Staples over ventriculostomy site and crani site Cardio: RRR and no murmur Resp: CTA B/L GI: BS positive non tnder Extremity:  No Edema Skin:   Other RIght upper chest wound from tunneled cath, CDI Neuro: Lethargic, Flat, Cranial Nerve II-XII normal, Abnormal Sensory reduced grimace to pinch RLE, Abnormal Motor 4/5 B delt, bi, tri, grip, 2- R HF, KE Ankle PF, 0 ankle DF, Reflexes: 3+ and Other poor attention to task, (`5sec) Musc/Skel:  Other no pain with UE or LE ROM Gen NAD   Assessment/Plan: 1. Functional deficits secondary to Right periopthalmic SAH with cognitive deficits, Left frontal IPH and gait disorder,RLE >LLE weakness  which require 3+ hours per  day of interdisciplinary therapy in a comprehensive inpatient rehab setting. Physiatrist is providing close team supervision and 24 hour management of active medical problems listed below. Physiatrist and rehab team continue to assess barriers to discharge/monitor patient progress toward functional and medical goals. FIM: Function - Bathing Position: Wheelchair/chair at sink Body parts bathed by patient: Right arm, Left arm, Chest, Abdomen, Front perineal area, Right upper leg, Left upper leg Body parts bathed by helper: Buttocks, Right lower leg, Left lower leg, Back Assist Level: 2 helpers  Function- Upper Body Dressing/Undressing What is the patient wearing?: Pull over shirt/dress Pull over shirt/dress - Perfomed by patient: Thread/unthread right sleeve, Thread/unthread left sleeve, Put head through opening Pull over shirt/dress - Perfomed by helper: Pull shirt over trunk Assist Level: Touching or steadying assistance(Pt > 75%) Function - Lower Body Dressing/Undressing What is the patient  wearing?: Pants, Non-skid slipper socks Position: Wheelchair/chair at sink Pants- Performed by helper: Thread/unthread right pants leg, Thread/unthread left pants leg, Pull pants up/down Non-skid slipper socks- Performed by patient: Don/doff right sock, Don/doff left sock Assist for footwear: Partial/moderate assist Assist for lower body dressing: 2 Helpers  Function - Toileting Toileting activity did not occur:  (Bed level on bedpan) Toileting steps completed by helper: Adjust clothing prior to toileting, Performs perineal hygiene, Adjust clothing after toileting Assist level: Two helpers  Function - Air cabin crew transfer assistive device: Facilities manager lift: Stedy Assist level to toilet: 2 helpers Assist level from toilet: 2 helpers  Function - Chair/bed transfer Chair/bed transfer method: Squat pivot Chair/bed transfer assist level: Maximal assist (Pt 25 - 49%/lift and lower) Chair/bed transfer assistive device: Armrests Chair/bed transfer details: Manual facilitation for weight shifting, Verbal cues for safe use of DME/AE  Function - Locomotion: Wheelchair Max wheelchair distance: 65f  Assist Level: Touching or steadying assistance (Pt > 75%) Wheel 50 feet with 2 turns activity did not occur: Refused Wheel 150 feet activity did not occur: Safety/medical concerns Turns around,maneuvers to table,bed, and toilet,negotiates 3% grade,maneuvers on rugs and over doorsills: No Function - Locomotion: Ambulation Ambulation activity did not occur: Safety/medical concerns Walk 10 feet activity did not occur: Safety/medical concerns Walk 150 feet activity did not occur: Safety/medical concerns Walk 10 feet on uneven surfaces activity did not occur: Safety/medical concerns  Function - Comprehension Comprehension: Auditory Comprehension assist level: Understands basic 50 - 74% of the time/ requires cueing 25 - 49% of the time  Function - Expression Expression:  Verbal Expression assist level: Expresses basic 25 - 49% of the time/requires cueing 50 - 75% of the time. Uses single words/gestures.  Function - Social Interaction Social Interaction assist level: Interacts appropriately less than 25% of the time. May be withdrawn or combative.  Function - Problem Solving Problem solving assist level: Solves basic 25 - 49% of the time - needs direction more than half the time to initiate, plan or complete simple activities  Function - Memory Memory assist level: Recognizes or recalls 25 - 49% of the time/requires cueing 50 - 75% of the time Patient normally able to recall (first 3 days only): That he or she is in a hospital  Medical Problem List and Plan: 1. Decreased functional mobility and [facility] with subsequent hydrocephalus and placement of the VPS -CIR PT, OT, SLP 2. DVT Prophylaxis/Anticoagulation: SCDs 3. Pain Management: Tylenol as needed 4. Mood: Provide emotional support 5. Neuropsych: This patient is notcapable of making decisions on herown behalf. -mittens to  prevent disruption of equipment -fall safety precautions Trial methylphenidate 100m qam and qnoon 6. Skin/Wound Care: Routine skin checks 7. Fluids/Electrolytes/Nutrition: Routine I&O with follow-up chemistries 8.Seizure disorder.Keppra 1000 mg twice a day 9.Dysphagia. Dysphagia #2 thin liquids/nasogastric tube feeds to supplement---remove when able to adequately meet nutritional needs--follow daily I's and O's. 10.Hypertension.Labetaolol 200 mg BID -DBP elevated particularly.  -check bp's q shift and with therapy.  Controlled 10/18 Vitals:   09/02/17 2012 09/03/17 0500  BP: (!) 122/56 135/90  Pulse: 91 98  Resp:  18  Temp:  98.5 F (36.9 C)  SpO2:  99%   11.Leukocytosis. Improved. CSF negative. Infectious disease follow-up as needed.All  antibiotics have since been discontinued  LOS (Days) 2 A FACE TO FACE EVALUATION WAS PERFORMED  Rashawna Scoles E 09/03/2017, 8:11 AM

## 2017-09-03 NOTE — IPOC Note (Signed)
Overall Plan of Care [facility]) Patient Details Name: Suzanne Brooks MRN: [id number] DOB: 18-Oct-1976  Admitting Diagnosis: <principal problem not specified>  Hospital Problems: Active Problems:   SAH (subarachnoid hemorrhage) (HCC)     Functional Problem List: Nursing Bladder, Bowel, Endurance, Medication Management, Motor, Skin Integrity, Sensory, Safety, Pain, Nutrition  PT Balance, Behavior, Endurance, Motor, Nutrition, Pain, Perception, Safety, Sensory, Skin Integrity  OT Balance, Cognition, Behavior, Endurance, Safety, Motor, Perception, Sensory  SLP Cognition, Endurance, Nutrition, Perception, Safety  TR         Basic ADL's: OT Eating, Grooming, Bathing, Dressing, Toileting     Advanced  ADL's: OT       Transfers: PT Bed Mobility, Bed to Chair, Car, [employer], Civil Service fast streamer, Research scientist (life sciences): PT Ambulation, Psychologist, prison and probation services, Stairs     Additional Impairments: OT Fuctional Use of Upper Extremity  SLP Swallowing, Communication, Social Cognition expression Social Interaction, Problem Solving, Memory, Attention, Awareness  TR      Anticipated Outcomes Item Anticipated Outcome  Self Feeding Supervision  Swallowing  Supervision   Basic self-care  Min A  Toileting  Min A   Bathroom Transfers Min-mod A  Bowel/Bladder  mod assist  Transfers  min assist with LRAD   Locomotion  min assist for house hold distances.   Communication  Supervision  Cognition  Min A to supervision  Pain  managed with mod assist  Safety/Judgment  min assist   Therapy Plan: PT Intensity: Minimum of 1-2 x/day ,45 to 90 minutes PT Frequency: 5 out of 7 days PT Duration Estimated Length of Stay: 21-25 days  OT Intensity: Minimum of 1-2 x/day, 45 to 90 minutes OT Frequency: 5 out of 7 days OT Duration/Estimated Length of Stay: 21-25 days SLP Intensity: Minumum of 1-2 x/day, 30 to 90 minutes SLP Frequency: 3 to 5 out of 7 days SLP Duration/Estimated Length of Stay:  3 weeks    Team Interventions: Nursing Interventions Patient/Family Education, Bladder Management, Bowel Management, Medication Management, Pain Management, Disease Management/Prevention, Skin Care/Wound Management, Discharge Planning, Cognitive Remediation/Compensation, Psychosocial Support, Dysphagia/Aspiration Precaution Training  PT interventions Ambulation/gait training, Cognitive remediation/compensation, Warden/ranger, Community reintegration, Discharge planning, Disease management/prevention, DME/adaptive equipment instruction, Functional electrical stimulation, Neuromuscular re-education, Pain management, Functional mobility training, Patient/family education, Psychosocial support, Skin care/wound management, Splinting/orthotics, Therapeutic Activities, Stair training, Therapeutic Exercise, UE/LE Strength taining/ROM, Visual/perceptual remediation/compensation, UE/LE Coordination activities, Wheelchair propulsion/positioning  OT Interventions Warden/ranger, Cognitive remediation/compensation, Discharge planning, Community reintegration, Disease mangement/prevention, Functional mobility training, Neuromuscular re-education, Pain management, Functional electrical stimulation, Patient/family education, Psychosocial support, Self Care/advanced ADL retraining, Therapeutic Activities, Splinting/orthotics, Therapeutic Exercise, UE/LE Strength taining/ROM, UE/LE Coordination activities, Wheelchair propulsion/positioning  SLP Interventions Cognitive remediation/compensation, Financial trader, Functional tasks, Patient/family education, Internal/external aids, Dysphagia/aspiration precaution training, Medication managment, Therapeutic Activities  TR Interventions    SW/CM Interventions Discharge Planning, Psychosocial Support, Patient/Family Education   Barriers to Discharge MD  Medical stability, Home enviroment access/loayout, Incontinence, Behavior and Nutritional means   Nursing Medical stability, Incontinence, Medication compliance, Nutrition means, Decreased caregiver support    PT Inaccessible home environment, Decreased caregiver support, Medical stability, Home environment access/layout, Medication compliance    OT Inaccessible home environment, Decreased caregiver support, Home environment access/layout, Incontinence, Insurance for SNF coverage Lives on second level apartment, unsure of children's true ability to provide needed phsyical and cognive assist for her at d/c  SLP Inaccessible home environment, Decreased caregiver support, Lack of/limited family support, Nutrition means    SW  Team Discharge Planning: Destination: PT-Home ,OT- Home (Plan for home, however, may need SNF) , SLP-Skilled Nursing Facility (SNF) Projected Follow-up: PT-Home health PT, OT-  Home health OT, SLP-24 hour supervision/assistance, Skilled Nursing facility Projected Equipment Needs: PT-Wheelchair (measurements), Wheelchair cushion (measurements), Rolling walker with 5" wheels, OT- 3 in 1 bedside comode, SLP-None recommended by SLP Equipment Details: PT- , OT-  Patient/family involved in discharge planning: PT- Patient,  OT-Patient, SLP-Patient  MD ELOS: 22-25d Medical Rehab Prognosis:  Good Assessment: 41 y.o.right handed femalewith history of hypertension as well as reported seizures.On no prescription medications at time of admission. Per report patient independent prior to admission multiple family in the area with daughter planning to provide assistance.Presented 07/30/2017 with seizure requiring intubation. Cranial CT scan showed diffuse subarachnoid hemorrhage. 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 A continuous EEG showed generalized polymorphic delta slowing suggesting severe  encephalopathy no seizure.Maintained on Keppra for seizure prophylaxis.Patient with persistent leukocytosis as well as fever with infectious disease consulted placed on broad-spectrum antibiotics. CSF cultures negative antibiotics completed. 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 Dysphagia #2 dietandthinliquid dietas well as nasogastric tube feeds for nutritional support due to lack of appetit    Now requiring 24/7 Rehab RN,MD, as well as CIR level PT, OT and SLP.  Treatment team will focus on ADLs and mobility [facility] See Team Conference Notes for weekly updates to the plan of care

## 2017-09-03 NOTE — Progress Notes (Signed)
Occupational Therapy Session Note  Patient Details  Name: Suzanne Brooks MRN: [id number] Date of Birth: 01/01/1976  Today's Date: 09/03/2017 OT Individual Time: 1300-1400 OT Individual Time Calculation (min): 60 min    Short Term Goals: Week 1:  OT Short Term Goal 1 (Week 1): Pt will sit EOB with min A during bathing task OT Short Term Goal 2 (Week 1): Pt will complete sit <> stand at sink with max A +1 in prep for functional task OT Short Term Goal 3 (Week 1): Pt will complete set-up of oral care task with no more than 1 VC for sequencing  Skilled Therapeutic Interventions/Progress Updates:    Pt seen for OT session focusing on standing balance/ endurance and cognitive remediation. Pt received at nurses station for supervision, agreeable to tx session. Completed x3 trials in standing frame (without harness) to address static standing. Pt required min A to stand with VCs for "rocking start" and initiation. Pt with R lean in standing, requiring max multimodal cues to facilitate midline orienation though pt with difficulty maintaining midline once achieved. Attempted to do peg board activity in standing, with pt required to replicate moderately complex pattern. Pt with 0% accuracy of correct placement or colors of pegs when task done in standing. Transitioned to completing in sitting position with pt requiring max cuing for re-direction to task and mod-max A for correctly identifying placement of pegs, activity not done to completion due to poor attention in moderately stimulating environment.  When present with magnetic plastic letters, she was able to correctly spell out her name with significantly increased time. Pt returned to room and transitioned to recliner with mod A squat pivot. Pt taken back to nurses station in recliner, left with QRB donned and supervision.   Therapy Documentation Precautions:  Precautions Precautions: Fall Restrictions Weight Bearing Restrictions: No Pain:     See Function Navigator for Current Functional Status.   Therapy/Group: Individual Therapy  Lewis, Tejah Brekke C 09/03/2017, 7:11 AM

## 2017-09-03 NOTE — Progress Notes (Signed)
Speech Language Pathology Daily Session Note  Patient Details  Name: Suzanne CatenaChristie Brooks MRN: [id number] Date of Birth: Mar 17, 1976  Today's Date: 09/03/2017 SLP Individual Time: 0815-0900 SLP Individual Time Calculation (min): 45 min  Short Term Goals: Week 1: SLP Short Term Goal 1 (Week 1): Pt will sustain attention to basic task for ~ 15 minutes with Min A cues.  SLP Short Term Goal 2 (Week 1): Pt will initiate basic familiar task within 50% of opportunities and Mod A cues.  SLP Short Term Goal 3 (Week 1): Pt will verbally respond to question in timely manner in 50% of opportunities and Mod A cues.  SLP Short Term Goal 4 (Week 1): Pt will increase vocal intensity to achieve ~ 75% intelligibility at the sentence level with Mod A cues.  SLP Short Term Goal 5 (Week 1): Pt will demonstrate consistent orientation x 4 with Min A cues.  SLP Short Term Goal 6 (Week 1): Pt will consume dysphagia 2 diet with thin liquids and minimal overt s/s of aspiration with supervision cues for use of compensatory swallow strategies.   Skilled Therapeutic Interventions: Skilled treatment session focused on dysphagia and cognition goals. SLP facilitated session by providing Total A multimodal cues to transfer pt from bed to wheelchair using Stedy with nursing assistance. Pt unable to focus attention effectively or follow 1 step basic directions. Pt required continued cues to sustain attention to consuming breakfast for each bolus. Pt with minimal task initiation or interaction with SLP. She was ~ 50% intelligible at the word level d/t decreased vocal intensity and no ability to self monitor. Continue to suspect that decreased vocal intensity is related to overall cognitive deficits instead of vocal cord dysfunction.  SLP further facilitated session by providing skilled observation of pt consuming dysphagia 2 breakfast tray with thin liquids. Pt with no overt s/s of aspiration. Pt handed off to nursing for continued  supervision of PO intake. Continue per current plan of care.      Function:  Eating Eating   Modified Consistency Diet: Yes Eating Assist Level: Helper feeds patient           Cognition Comprehension Comprehension assist level: Understands basic 50 - 74% of the time/ requires cueing 25 - 49% of the time;Understands basic 75 - 89% of the time/ requires cueing 10 - 24% of the time  Expression   Expression assist level: Expresses basic 50 - 74% of the time/requires cueing 25 - 49% of the time. Needs to repeat parts of sentences.  Social Interaction Social Interaction assist level: Interacts appropriately 25 - 49% of time - Needs frequent redirection.  Problem Solving Problem solving assist level: Solves basic 25 - 49% of the time - needs direction more than half the time to initiate, plan or complete simple activities  Memory Memory assist level: Recognizes or recalls 25 - 49% of the time/requires cueing 50 - 75% of the time    Pain    Therapy/Group: Individual Therapy  Laquandra Carrillo 09/03/2017, 3:59 PM

## 2017-09-03 NOTE — Progress Notes (Signed)
Day 1 of 3: Calorie Count Note  72 hour calorie count ordered.  Diet: Dysphagia 2 with thin liquids Supplements: Ensure Enlive po TID, each supplement provides 350 kcal and 20 grams of protein.  Breakfast: 130 kcal, 6 grams of protein Lunch: 107 kcal, 4 grams of protein Dinner: 266 kcal, 12 grams of protein Supplements: 563 kcal, 27 grams of protein  Day 1 Total intake: 1066 kcal (58% of minimum estimated needs)  49 grams of protein (61% of minimum estimated needs)  Estimated Nutritional Needs:  Kcal:  1850-2050 Protein:  80-95 grams Fluid:  1.8 - 2 L/day  Nutrition Dx:  Inadequate oral intake related to dysphagia as evidenced by meal completion < 50%; ongoing  Goal:  Patient will meet greater than or equal to 90% of their needs; progressing  Intervention:   Nocturnal tube feeds of Vital 1.5 formula via Cortrak NGT at new goal rate of 60 ml/hr x 12 hours (7pm-7am) to provide 1080 kcal (58% of kcal needs), 49 grams of protein (61% of protein needs), and 547 ml of water.   Continue free water flushes per tube until fluids are adequate.    Provide Ensure Enlive po TID, each supplement provides 350 kcal and 20 grams of protein.  RD to follow up tomorrow 10/19 with Day 2 calorie count results.  Roslyn SmilingStephanie Jenika Chiem, MS, RD, LDN Pager # [phone] After hours/ weekend pager # [phone]

## 2017-09-04 ENCOUNTER — Inpatient Hospital Stay (HOSPITAL_COMMUNITY): Payer: Medicaid Other | Admitting: Occupational Therapy

## 2017-09-04 ENCOUNTER — Inpatient Hospital Stay (HOSPITAL_COMMUNITY): Payer: Medicaid Other | Admitting: Speech Pathology

## 2017-09-04 ENCOUNTER — Inpatient Hospital Stay (HOSPITAL_COMMUNITY): Payer: Medicaid Other | Admitting: Physical Therapy

## 2017-09-04 DIAGNOSIS — G8191 Hemiplegia, unspecified affecting right dominant side: Secondary | ICD-10-CM

## 2017-09-04 MED ORDER — RANITIDINE HCL 150 MG PO TABS
150.0000 mg | ORAL_TABLET | Freq: Two times a day (BID) | ORAL | Status: DC
  Administered 2017-09-04 – 2017-09-22 (×37): 150 mg via ORAL
  Filled 2017-09-04 (×38): qty 1

## 2017-09-04 MED ORDER — ADULT MULTIVITAMIN W/MINERALS CH
1.0000 | ORAL_TABLET | Freq: Every day | ORAL | Status: DC
  Administered 2017-09-04 – 2017-09-22 (×19): 1 via ORAL
  Filled 2017-09-04 (×19): qty 1

## 2017-09-04 MED ORDER — LEVETIRACETAM 500 MG PO TABS
500.0000 mg | ORAL_TABLET | Freq: Two times a day (BID) | ORAL | Status: DC
  Administered 2017-09-04 (×2): 500 mg via ORAL
  Filled 2017-09-04 (×2): qty 1

## 2017-09-04 NOTE — Progress Notes (Signed)
Day 2 of 3: Calorie Count Note  72 hour calorie count ordered.  Diet: Dysphagia 2 with thin liquids Supplements: Ensure Enlive po TID, each supplement provides 350 kcal and 20 grams of protein.  Breakfast: 327 kcal, 11 grams of protein Lunch: 0% refused Dinner: 0% Supplements: 700 kcal, 40 grams of protein  Day 2 Total intake: 1027 kcal (56% of minimum estimated needs)  51 grams of protein (64% of minimum estimated needs)  Estimated Nutritional Needs:  Kcal:  1850-2050 Protein:  80-95 grams Fluid:  1.8 - 2 L/day  Nutrition Dx:  Inadequate oral intake related to dysphagia as evidenced by meal completion < 50%; ongoing  Goal:  Patient will meet greater than or equal to 90% of their needs; progressing  Intervention:   Continue nocturnal tube feeds of Vital 1.5 formula via Cortrak NGT at new goal rate of 60 ml/hr x 12 hours (7pm-7am) to provide 1080 kcal (58% of kcal needs), 49 grams of protein (61% of protein needs), and 547 ml of water.   Continue free water flushes per tube until fluids are adequate.    Provide Ensure Enlive po TID, each supplement provides 350 kcal and 20 grams of protein.  RD to follow up Monday 10/22 with final day of calorie count results.  Roslyn SmilingStephanie Tehani Mersman, MS, RD, LDN Pager # [phone] After hours/ weekend pager # [phone]

## 2017-09-04 NOTE — Progress Notes (Signed)
Physical Therapy Session Note  Patient Details  Name: Suzanne Brooks MRN: [id number] Date of Birth: 05-07-1976  Today's Date: 09/04/2017 PT Individual Time: 1330-1430 PT Individual Time Calculation (min): 60 min   Short Term Goals: Week 1:  PT Short Term Goal 1 (Week 1): Pt will transfers with moderate assist from PT  PT Short Term Goal 2 (Week 1): Pt will ambulate 48f with max assist  PT Short Term Goal 3 (Week 1): Pt will maintain standing balance for 1 minute with moderate assist  PT Short Term Goal 4 (Week 1): Pt will perform bed mobility with moderate assist   Skilled Therapeutic Interventions/Progress Updates:    Pt received sitting in WC and agreeable to PT  Transfer to mat table with mod-max assist from with stand pivot transfer.   Bed mobility for sit<>supine with moderate assist from PT and max cues for sequencing as safety including UE management to allow full transfer to supine. Supine NMR including SAQ and hip abduction x 10 BLE. Pt reports need for urination and returned to room.   Toilet transfer with max assist +2 for safety and clothing management as well as max cues for use of grab rail. Pt noted to have been incontinent prior to toilet transfer.    Peg board puzzle for problem solving and task initiation. Max cues for participation as well as reduced options to only 2 fields to complete simple alternating pattern.   Pt returned to room and performed  Standing pivot transfer to bed with max assist. Sit>supine completed with min assist to control the RLE,  and pt left supine in bed with call bell in reach and all needs met.       Therapy Documentation Precautions:  Precautions Precautions: Fall Restrictions Weight Bearing Restrictions: No Vital Signs: Therapy Vitals Temp: 97.8 F (36.6 C) Temp Source: Oral Pulse Rate: 92 Resp: 17 BP: 127/88 Patient Position (if appropriate): Lying Oxygen Therapy SpO2: 99 % O2 Device: Not Delivered Pain: Pain  Assessment Pain Assessment: Faces Faces Pain Scale: Hurts a little bit Pain Type: Acute pain Pain Location: Head Pain Descriptors / Indicators: Headache Pain Frequency: Occasional Pain Intervention(s): Medication (See eMAR)   See Function Navigator for Current Functional Status.   Therapy/Group: Individual Therapy  ALorie Phenix10/19/2018, 5:27 PM

## 2017-09-04 NOTE — Progress Notes (Signed)
Speech Language Pathology Daily Session Note  Patient Details  Name: Suzanne Brooks MRN: [id number] Date of Birth: Oct 06, 1976  Today's Date: 09/04/2017 SLP Individual Time: [id number] SLP Individual Time Calculation (min): 75 min  Short Term Goals: Week 1: SLP Short Term Goal 1 (Week 1): Pt will sustain attention to basic task for ~ 15 minutes with Min A cues.  SLP Short Term Goal 2 (Week 1): Pt will initiate basic familiar task within 50% of opportunities and Mod A cues.  SLP Short Term Goal 3 (Week 1): Pt will verbally respond to question in timely manner in 50% of opportunities and Mod A cues.  SLP Short Term Goal 4 (Week 1): Pt will increase vocal intensity to achieve ~ 75% intelligibility at the sentence level with Mod A cues.  SLP Short Term Goal 5 (Week 1): Pt will demonstrate consistent orientation x 4 with Min A cues.  SLP Short Term Goal 6 (Week 1): Pt will consume dysphagia 2 diet with thin liquids and minimal overt s/s of aspiration with supervision cues for use of compensatory swallow strategies.   Skilled Therapeutic Interventions: Skilled treatment session focused on cognition and dysphagia goals. SLP facilitated session by providing Max A multimodal cues for pericare and transfer from bed to wheelchair. Pt with Max A cues to initiate movement and delayed off-topic responses. SLP further facilitated session by providing skilled observation of pt consuming dysphagia 2 breakfast with thin liquids. Pt with no overt s/s of aspiration but no initiation to eat despite Total cues. Music used to increase attention to task (sustain attention for length of song) also used to increase vocal intensity during brief moments that she sang with music. Pt unable to answer yes/no questions related to orientation. Pt left at nursing station for increased supervision.     Function:  Eating Eating   Modified Consistency Diet: Yes Eating Assist Level: Helper feeds patient            Cognition Comprehension Comprehension assist level: Understands basic 25 - 49% of the time/ requires cueing 50 - 75% of the time  Expression   Expression assist level: Expresses basic 25 - 49% of the time/requires cueing 50 - 75% of the time. Uses single words/gestures.  Social Interaction Social Interaction assist level: Interacts appropriately 25 - 49% of time - Needs frequent redirection.  Problem Solving Problem solving assist level: Solves basic less than 25% of the time - needs direction nearly all the time or does not effectively solve problems and may need a restraint for safety  Memory Memory assist level: Recognizes or recalls 25 - 49% of the time/requires cueing 50 - 75% of the time    Pain    Therapy/Group: Individual Therapy  Aland Chestnutt 09/04/2017, 9:18 AM

## 2017-09-04 NOTE — Progress Notes (Signed)
Subjective/Complaints:  No issues overnite, does not remember me from yesterday  ROS-  No CP, SOB, N/V/D Objective: Vital Signs: Blood pressure 118/90, pulse (!) 101, temperature 99 F (37.2 C), temperature source Oral, resp. rate 16, height 5' 3"  (1.6 m), weight 64.2 kg (141 lb 9.1 oz), SpO2 100 %. No results found. Results for orders placed or performed during the hospital encounter of 09/01/17 (from the past 72 hour(s))  CBC WITH DIFFERENTIAL     Status: Abnormal   Collection Time: 09/02/17  5:38 AM  Result Value Ref Range   WBC 12.3 (H) 4.0 - 10.5 K/uL   RBC 3.07 (L) 3.87 - 5.11 MIL/uL   Hemoglobin 8.4 (L) 12.0 - 15.0 g/dL    Comment: CONSISTENT WITH PREVIOUS RESULT   HCT 26.8 (L) 36.0 - 46.0 %   MCV 87.3 78.0 - 100.0 fL   MCH 27.4 26.0 - 34.0 pg   MCHC 31.3 30.0 - 36.0 g/dL   RDW 20.8 (H) 11.5 - 15.5 %   Platelets 423 (H) 150 - 400 K/uL    Comment: REPEATED TO VERIFY   Neutrophils Relative % 66 %   Lymphocytes Relative 20 %   Monocytes Relative 7 %   Eosinophils Relative 7 %   Basophils Relative 0 %   Neutro Abs 8.0 (H) 1.7 - 7.7 K/uL   Lymphs Abs 2.5 0.7 - 4.0 K/uL   Monocytes Absolute 0.9 0.1 - 1.0 K/uL   Eosinophils Absolute 0.9 (H) 0.0 - 0.7 K/uL   Basophils Absolute 0.0 0.0 - 0.1 K/uL   Smear Review MORPHOLOGY UNREMARKABLE   Comprehensive metabolic panel     Status: None   Collection Time: 09/02/17  5:38 AM  Result Value Ref Range   Sodium 138 135 - 145 mmol/L   Potassium 4.0 3.5 - 5.1 mmol/L   Chloride 101 101 - 111 mmol/L   CO2 27 22 - 32 mmol/L   Glucose, Bld 95 65 - 99 mg/dL   BUN 18 6 - 20 mg/dL   Creatinine, Ser 0.49 0.44 - 1.00 mg/dL   Calcium 9.9 8.9 - 10.3 mg/dL   Total Protein 7.6 6.5 - 8.1 g/dL   Albumin 3.5 3.5 - 5.0 g/dL   AST 29 15 - 41 U/L   ALT 54 14 - 54 U/L   Alkaline Phosphatase 78 38 - 126 U/L   Total Bilirubin 0.7 0.3 - 1.2 mg/dL   GFR calc non Af Amer >60 >60 mL/min   GFR calc Af Amer >60 >60 mL/min    Comment: (NOTE) The eGFR  has been calculated using the CKD EPI equation. This calculation has not been validated in all clinical situations. eGFR's persistently <60 mL/min signify possible Chronic Kidney Disease.    Anion gap 10 5 - 15     HEENT: Staples over ventriculostomy site and crani site, well healed Cardio: RRR and no murmur Resp: CTA B/L GI: BS positive non tnder Extremity:  No Edema Skin:   Other RIght upper chest wound from tunneled cath, CDI Neuro: Lethargic, Flat, Cranial Nerve II-XII normal, Abnormal Sensory reduced grimace to pinch RLE, Abnormal Motor 4/5 B delt, bi, tri, grip, 2- R HF, KE Ankle PF, 0 ankle DF, Reflexes: 3+ and Other poor attention to task, (`5sec) Musc/Skel:  Other no pain with UE or LE ROM Gen NAD   Assessment/Plan: 1. Functional deficits secondary to Right periopthalmic SAH with cognitive deficits, Left frontal IPH and gait disorder,RLE >LLE weakness  which require 3+ hours per  day of interdisciplinary therapy in a comprehensive inpatient rehab setting. Physiatrist is providing close team supervision and 24 hour management of active medical problems listed below. Physiatrist and rehab team continue to assess barriers to discharge/monitor patient progress toward functional and medical goals. FIM: Function - Bathing Position: Shower Body parts bathed by patient: Right arm, Left arm, Chest, Abdomen, Front perineal area Body parts bathed by helper: Buttocks, Right upper leg, Left upper leg, Right lower leg, Left lower leg, Back Assist Level: Touching or steadying assistance(Pt > 75%)  Function- Upper Body Dressing/Undressing What is the patient wearing?: Pull over shirt/dress Pull over shirt/dress - Perfomed by patient: Thread/unthread right sleeve, Thread/unthread left sleeve Pull over shirt/dress - Perfomed by helper: Put head through opening, Pull shirt over trunk Assist Level: Touching or steadying assistance(Pt > 75%) Function - Lower Body Dressing/Undressing What is  the patient wearing?: Pants, Non-skid slipper socks Position: Wheelchair/chair at sink Pants- Performed by helper: Thread/unthread right pants leg, Thread/unthread left pants leg, Pull pants up/down Non-skid slipper socks- Performed by patient: Don/doff right sock, Don/doff left sock Non-skid slipper socks- Performed by helper: Don/doff right sock, Don/doff left sock Assist for footwear: Partial/moderate assist, Maximal assist Assist for lower body dressing: Touching or steadying assistance (Pt > 75%)  Function - Toileting Toileting activity did not occur:  (Bed level on bedpan) Toileting steps completed by patient: Adjust clothing prior to toileting Toileting steps completed by helper: Performs perineal hygiene, Adjust clothing after toileting Assist level: Touching or steadying assistance (Pt.75%)  Function - Air cabin crew transfer assistive device: Grab bar Mechanical lift: Stedy Assist level to toilet: Maximal assist (Pt 25 - 49%/lift and lower) Assist level from toilet: Moderate assist (Pt 50 - 74%/lift or lower)  Function - Chair/bed transfer Chair/bed transfer method: Squat pivot Chair/bed transfer assist level: Moderate assist (Pt 50 - 74%/lift or lower) Chair/bed transfer assistive device: Armrests Chair/bed transfer details: Manual facilitation for weight shifting, Verbal cues for safe use of DME/AE  Function - Locomotion: Wheelchair Max wheelchair distance: 35f  Assist Level: Touching or steadying assistance (Pt > 75%) Wheel 50 feet with 2 turns activity did not occur: Refused Wheel 150 feet activity did not occur: Safety/medical concerns Turns around,maneuvers to table,bed, and toilet,negotiates 3% grade,maneuvers on rugs and over doorsills: No Function - Locomotion: Ambulation Ambulation activity did not occur: Safety/medical concerns Assistive device: Rail in hallway Max distance: 15 Assist level: 2 helpers Walk 10 feet activity did not occur:  Safety/medical concerns Assist level: 2 helpers Walk 150 feet activity did not occur: Safety/medical concerns Walk 10 feet on uneven surfaces activity did not occur: Safety/medical concerns  Function - Comprehension Comprehension: Auditory Comprehension assist level: Understands basic 25 - 49% of the time/ requires cueing 50 - 75% of the time  Function - Expression Expression: Nonverbal Expression assist level: Expresses basic 25 - 49% of the time/requires cueing 50 - 75% of the time. Uses single words/gestures.  Function - Social Interaction Social Interaction assist level: Interacts appropriately 25 - 49% of time - Needs frequent redirection.  Function - Problem Solving Problem solving assist level: Solves basic less than 25% of the time - needs direction nearly all the time or does not effectively solve problems and may need a restraint for safety  Function - Memory Memory assist level: Recognizes or recalls 25 - 49% of the time/requires cueing 50 - 75% of the time Patient normally able to recall (first 3 days only): That he or she is in a hospital  Medical Problem List and Plan: 1. 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 DVT Prophylaxis/Anticoagulation: SCDs 3. Pain Management: Tylenol as needed 4. Mood: Provide emotional support 5. Neuropsych: This patient is notcapable of making decisions on herown behalf. -mittens to prevent disruption of equipment -fall safety precautions Trial methylphenidate 58m qam and qnoon 6. Skin/Wound Care: Routine skin checks 7. Fluids/Electrolytes/Nutrition: Routine I&O with follow-up chemistries 8.Seizure disorder.Keppra 1000 mg twice a day 9.Dysphagia. Dysphagia #2 thin liquids/nasogastric tube feeds to supplement---remove when able to adequately meet  nutritional needs--follow daily I's and O's. 10.Hypertension.Labetalol 200 mg BID -DBP elevated particularly.  -check bp's q shift and with therapy.  Controlled 10/19, mild tachy,  Vitals:   09/03/17 2049 09/04/17 0440  BP: (!) 116/93 118/90  Pulse:  (!) 101  Resp:  16  Temp:  99 F (37.2 C)  SpO2:  100%   11.Leukocytosis. Improved.Afebrile CSF negative. Infectious disease follow-up as needed.All antibiotics have since been discontinued  LOS (Days) 3 A FACE TO FACE EVALUATION WAS PERFORMED  Zian Delair E 09/04/2017, 7:59 AM

## 2017-09-04 NOTE — Progress Notes (Signed)
Occupational Therapy Session Note  Patient Details  Name: Suzanne CatenaChristie Nelms MRN: [id number] Date of Birth: 03/24/1976  Today's Date: 09/04/2017 OT Individual Time: 1000-1100 OT Individual Time Calculation (min): 60 min    Short Term Goals: Week 1:  OT Short Term Goal 1 (Week 1): Pt will sit EOB with min A during bathing task OT Short Term Goal 2 (Week 1): Pt will complete sit <> stand at sink with max A +1 in prep for functional task OT Short Term Goal 3 (Week 1): Pt will complete set-up of oral care task with no more than 1 VC for sequencing  Skilled Therapeutic Interventions/Progress Updates:    Pt seen for OT ADL bathing/dressing session. Pt received at nurses station, agreeable to tx session. She completed stand pivot transfers w/c <> BSC in shower with mod A using grab bars with max VCs for sequencing and significantly increased time for processing of directions. She bathed seated on BSC, VCs for attending to all areas. Pt able to use B UEs at functional level to complete bathing/ grooming tasks. She was able to don shirt with set-up and threaded L LE into pants with supervision, however, did not attend to R LE to thread on pants and was unable to be cued to do so. She stood at sink with mod A overall, min A progressing to max A for dynamic standing balance while she attempted to pull pants up.  Completed mod A squat pivot transfer into recliner. Pt taken to nurses station for supervision at end of session for supervision.   Therapy Documentation Precautions:  Precautions Precautions: Fall Restrictions Weight Bearing Restrictions: No Pain:   No/ denies pain  See Function Navigator for Current Functional Status.   Therapy/Group: Individual Therapy  Lewis, Shion Bluestein C 09/04/2017, 6:49 AM

## 2017-09-05 ENCOUNTER — Inpatient Hospital Stay (HOSPITAL_COMMUNITY): Payer: Medicaid Other | Admitting: Speech Pathology

## 2017-09-05 ENCOUNTER — Inpatient Hospital Stay (HOSPITAL_COMMUNITY): Payer: Medicaid Other | Admitting: Physical Therapy

## 2017-09-05 DIAGNOSIS — D72829 Elevated white blood cell count, unspecified: Secondary | ICD-10-CM

## 2017-09-05 DIAGNOSIS — R1310 Dysphagia, unspecified: Secondary | ICD-10-CM

## 2017-09-05 DIAGNOSIS — I10 Essential (primary) hypertension: Secondary | ICD-10-CM

## 2017-09-05 LAB — URINALYSIS, ROUTINE W REFLEX MICROSCOPIC
Bilirubin Urine: NEGATIVE
Glucose, UA: NEGATIVE mg/dL
Hgb urine dipstick: NEGATIVE
Ketones, ur: NEGATIVE mg/dL
NITRITE: POSITIVE — AB
Protein, ur: NEGATIVE mg/dL
SPECIFIC GRAVITY, URINE: 1.02 (ref 1.005–1.030)
pH: 6 (ref 5.0–8.0)

## 2017-09-05 LAB — URINALYSIS, MICROSCOPIC (REFLEX): RBC / HPF: NONE SEEN RBC/hpf (ref 0–5)

## 2017-09-05 MED ORDER — LEVETIRACETAM 100 MG/ML PO SOLN
500.0000 mg | Freq: Two times a day (BID) | ORAL | Status: DC
  Administered 2017-09-05 – 2017-09-09 (×9): 500 mg via ORAL
  Filled 2017-09-05 (×9): qty 5

## 2017-09-05 NOTE — Progress Notes (Signed)
Social Work Patient ID: [name], female   DOB: [birth year], 41 y.o.   MRN: [id number]        Team Conference  Team conference was held 09-02-17 with the following team members in attendance: Dr. Claudette LawsAndrew Kirsteins; Ronny BaconWhitney Reardon, RN; Grier RocherAustin Tucker, PT; Cornell Barmanosita Dechalus, PTA; Johnsie CancelAmy Lewis, OT; Colin BentonMadison Cratch, SLP; Tora DuckMarie Noel, RN, CRRN; Staci AcostaJenny Perla Echavarria, LCSW.  Pt is expected to be on CIR for about 3 weeks with targeted d/c date of 09-25-17 with min A level goals.  Team is just evaluating pt today, so full conference notes not available at this time.  CSW spoke with pt's dtr, Suzanne Brooks, to update her on the above.  She is unsure her home has the right setup for pt and she does not have good access to transportation, so she wonders if pt should go to pt's mother's home.  CSW will talk with her and help family with d/c planning.  CSW will continue to follow and assist as needed.

## 2017-09-05 NOTE — Progress Notes (Signed)
[facility] Individual Statement of Services  Patient Name:  Suzanne Brooks  Date:  09/05/2017  Welcome to the [facility].  Our goal is to provide you with an individualized program based on your diagnosis and situation, designed to meet your specific needs.  With this comprehensive rehabilitation program, you will be expected to participate in at least 3 hours of rehabilitation therapies Monday-Friday, with modified therapy programming on the weekends.  Your rehabilitation program will include the following services:  Physical Therapy (PT), Occupational Therapy (OT), Speech Therapy (ST), 24 hour per day rehabilitation nursing, Therapeutic Recreaction (TR), Neuropsychology, Case Management (Social Worker), Rehabilitation Medicine and Nutrition Services  Weekly team conferences will be held on Wednesdays to discuss your progress.  Your Social Worker will talk with you frequently to get your input and to update you on team discussions.  Team conferences with you and your family in attendance may also be held.  Expected length of stay:  3 weeks  Overall anticipated outcome:  Minimal Assistance  Depending on your progress and recovery, your program may change. Your Social Worker will coordinate services and will keep you informed of any changes. Your Social Worker's name and contact numbers are listed  below.  The following services may also be recommended but are not provided by the [facility]:   Driving Evaluations  Home Health Rehabiltiation Services  Outpatient Rehabilitation Services  Vocational Rehabilitation   Arrangements will be made to provide these services after discharge if needed.  Arrangements include referral to agencies that provide these services.  Your insurance has been verified to be:  Medicaid Your primary doctor is:  You cannot share this with team at this time.  Family is unsure.  Pertinent information  will be shared with your doctor and your insurance company.  Social Worker:  Staci AcostaJenny Yarely Bebee, LCSW  [phone] or ([phone]) [phone]  Information discussed with and copy given to patient by: Elvera LennoxPrevatt, Suhaan Perleberg Capps, 09/05/2017, 11:54 PM

## 2017-09-05 NOTE — Progress Notes (Signed)
Speech Language Pathology Daily Session Note  Patient Details  Name: Suzanne CatenaChristie Brooks MRN: [id number] Date of Birth: 1976/06/22  Today's Date: 09/05/2017 SLP Individual Time: 1505-1530 SLP Individual Time Calculation (min): 25 min  Short Term Goals: Week 1: SLP Short Term Goal 1 (Week 1): Pt will sustain attention to basic task for ~ 15 minutes with Min A cues.  SLP Short Term Goal 2 (Week 1): Pt will initiate basic familiar task within 50% of opportunities and Mod A cues.  SLP Short Term Goal 3 (Week 1): Pt will verbally respond to question in timely manner in 50% of opportunities and Mod A cues.  SLP Short Term Goal 4 (Week 1): Pt will increase vocal intensity to achieve ~ 75% intelligibility at the sentence level with Mod A cues.  SLP Short Term Goal 5 (Week 1): Pt will demonstrate consistent orientation x 4 with Min A cues.  SLP Short Term Goal 6 (Week 1): Pt will consume dysphagia 2 diet with thin liquids and minimal overt s/s of aspiration with supervision cues for use of compensatory swallow strategies.   Skilled Therapeutic Interventions:  Pt was seen for skilled ST targeting cognitive goals.  Pt needed max stimulation to awaken upon therapist's arrival.  Once awake, pt needed max assist multimodal cues for initiation of verbalization to select choice of activity from a field of two.  Pt requested to have a snack for therapy but then immediately stated that she needed to use the bathroom.   Pt had already been incontinent upon asking to use the bathroom and needed mod-max assist multimodal cues for task initiation when sequencing hygiene.  Pt was left in bed with bed alarm set and call bell within reach.  Continue per current plan of care.       Function:  Eating Eating                 Cognition Comprehension Comprehension assist level: Understands basic 25 - 49% of the time/ requires cueing 50 - 75% of the time  Expression   Expression assist level: Expresses basic 25 - 49%  of the time/requires cueing 50 - 75% of the time. Uses single words/gestures.  Social Interaction Social Interaction assist level: Interacts appropriately 25 - 49% of time - Needs frequent redirection.  Problem Solving Problem solving assist level: Solves basic less than 25% of the time - needs direction nearly all the time or does not effectively solve problems and may need a restraint for safety  Memory Memory assist level: Recognizes or recalls 25 - 49% of the time/requires cueing 50 - 75% of the time    Pain Pain Assessment Pain Assessment: No/denies pain  Therapy/Group: Individual Therapy  Suzanne Brooks, Suzanne Brooks 09/05/2017, 3:32 PM

## 2017-09-05 NOTE — Progress Notes (Signed)
Physical Therapy Session Note  Patient Details  Name: Suzanne Brooks MRN: [id number] Date of Birth: Mar 20, 1976  Today's Date: 09/05/2017 PT Individual Time: 0850-0930 PT Individual Time Calculation (min): 40 min   Short Term Goals: Week 1:  PT Short Term Goal 1 (Week 1): Pt will transfers with moderate assist from PT  PT Short Term Goal 2 (Week 1): Pt will ambulate 96f with max assist  PT Short Term Goal 3 (Week 1): Pt will maintain standing balance for 1 minute with moderate assist  PT Short Term Goal 4 (Week 1): Pt will perform bed mobility with moderate assist   Skilled Therapeutic Interventions/Progress Updates:   Pt received supine in bed and agreeable to PT. Supine>sit transfer with mod assist and moderate multimodal cues for safety and use of bed features.   Stand pivot transfer with mod assist from PT with max cues for safety.   Gait training for forced use of the RLE and improved midline orientaiton x 320fwith mod assist and +2 for WC follow.   Standing tolerance/balance 2x 2 min while playing checkers at high low table. Mod assist for standing balance with intermittent 1-2 UE support. Pt able to correct LOB to the R wth moderate cues from PT and slight facilitation of R kne extension.   WC mobility x 7562fith min assist from PT. Moderate cues for safety and proper turning technique to prevent hitting obstacles on the R and the L.   Patient returned to  RN station and [facility] needs met.         Therapy Documentation Precautions:  Precautions Precautions: Fall Restrictions Weight Bearing Restrictions: No Pain: 0/10 at rest.   See Function Navigator for Current Functional Status.   Therapy/Group: Individual Therapy  AusLorie Phenix/20/2018, 9:36 AM

## 2017-09-05 NOTE — Progress Notes (Signed)
Subjective/Complaints: Patient seen lying in bed this morning. No issues overnight per report. She is nonverbal.  ROS-  unable to assess due to cognition/participation  Objective: Vital Signs: Blood pressure 107/70, pulse 85, temperature 98.2 F (36.8 C), temperature source Oral, resp. rate 18, height 5\' 3"  (1.6 m), weight 64.3 kg (141 lb 11.2 oz), SpO2 100 %. No results found. No results found for this or any previous visit (from the past 72 hour(s)).   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 +NGT Cardio: RRR and no JVD Resp: CTA B/L. Unlabored. GI: BS positive, non tender Skin:   Other RIght upper chest wound from tunneled cath, CDI Neuro: Lethargic, Flat,  Nonverbal.  Motor 4+/5 B delt, bi, tri, grip RLE: 2-/5 HF, KE Ankle PF, 0 ankle DF Musc/Skel:  No edema. No tenderness. Gen NAD. Vital signs reviewed. Psych: Nonverbal.  Assessment/Plan: 1. Functional deficits secondary to Right periopthalmic SAH with cognitive deficits, Left frontal IPH and gait disorder,RLE >LLE weakness  which require 3+ hours per day of interdisciplinary therapy in a comprehensive inpatient rehab setting. Physiatrist is providing close team supervision and 24 hour management of active medical problems listed below. Physiatrist and rehab team continue to assess barriers to discharge/monitor patient progress toward functional and medical goals. FIM: Function - Bathing Position: Shower Body parts bathed by patient: Right arm, Left arm, Chest, Abdomen, Front perineal area, Buttocks, Right upper leg, Left upper leg Body parts bathed by helper: Right lower leg, Left lower leg, Back Assist Level: Touching or steadying assistance(Pt > 75%)  Function- Upper Body Dressing/Undressing What is the patient wearing?: Hospital gown Pull over shirt/dress - Perfomed by patient: Thread/unthread right sleeve, Thread/unthread left sleeve, Put head through opening, Pull shirt over trunk Pull  over shirt/dress - Perfomed by helper: Put head through opening, Pull shirt over trunk Assist Level: Touching or steadying assistance(Pt > 75%) Function - Lower Body Dressing/Undressing What is the patient wearing?: Pants Position: Wheelchair/chair at sink Pants- Performed by patient: Thread/unthread left pants leg Pants- Performed by helper: Thread/unthread right pants leg, Thread/unthread left pants leg, Pull pants up/down Non-skid slipper socks- Performed by patient: Don/doff right sock, Don/doff left sock Non-skid slipper socks- Performed by helper: Don/doff right sock, Don/doff left sock Shoes - Performed by helper: Don/doff right shoe, Don/doff left shoe, Fasten right, Fasten left Assist for footwear: Maximal assist Assist for lower body dressing: 2 Helpers  Function - Toileting Toileting activity did not occur: No continent bowel/bladder event Toileting steps completed by patient: Adjust clothing prior to toileting Toileting steps completed by helper: Adjust clothing prior to toileting, Performs perineal hygiene, Adjust clothing after toileting Assist level: Touching or steadying assistance (Pt.75%)  Function - ArchivistToilet Transfers Toilet transfer assistive device: Grab bar Mechanical lift: Stedy Assist level to toilet: Moderate assist (Pt 50 - 74%/lift or lower) Assist level from toilet: Moderate assist (Pt 50 - 74%/lift or lower)  Function - Chair/bed transfer Chair/bed transfer method: Stand pivot Chair/bed transfer assist level: Moderate assist (Pt 50 - 74%/lift or lower) Chair/bed transfer assistive device: Armrests Chair/bed transfer details: Manual facilitation for weight shifting, Verbal cues for safe use of DME/AE  Function - Locomotion: Wheelchair Type: Manual Max wheelchair distance: 1675ft Assist Level: Touching or steadying assistance (Pt > 75%) Wheel 50 feet with 2 turns activity did not occur: Refused Assist Level: Touching or steadying assistance (Pt > 75%) Wheel  150 feet activity did not occur: Safety/medical concerns Turns around,maneuvers to table,bed, and toilet,negotiates 3% grade,maneuvers on  rugs and over doorsills: No Function - Locomotion: Ambulation Ambulation activity did not occur: Safety/medical concerns Assistive device: Rail in hallway Max distance: 11ft  Assist level: Moderate assist (Pt 50 - 74%) Walk 10 feet activity did not occur: Safety/medical concerns Assist level: Moderate assist (Pt 50 - 74%) Walk 150 feet activity did not occur: Safety/medical concerns Walk 10 feet on uneven surfaces activity did not occur: Safety/medical concerns  Function - Comprehension Comprehension: Auditory Comprehension assist level: Understands basic 25 - 49% of the time/ requires cueing 50 - 75% of the time  Function - Expression Expression: Verbal Expression assist level: Expresses basic 25 - 49% of the time/requires cueing 50 - 75% of the time. Uses single words/gestures.  Function - Social Interaction Social Interaction assist level: Interacts appropriately 25 - 49% of time - Needs frequent redirection.  Function - Problem Solving Problem solving assist level: Solves basic less than 25% of the time - needs direction nearly all the time or does not effectively solve problems and may need a restraint for safety  Function - Memory Memory assist level: Recognizes or recalls 25 - 49% of the time/requires cueing 50 - 75% of the time Patient normally able to recall (first 3 days only): That he or she is in a hospital  Medical Problem List and Plan: 1. 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 Images reviewed. 2. DVT Prophylaxis/Anticoagulation: SCDs 3. Pain Management: Tylenol as needed 4. Mood: Provide emotional support 5. Neuropsych: This patient is notcapable of making decisions on herown  behalf. -fall safety precautions Trial methylphenidate 5mg  qam and qnoon 6. Skin/Wound Care: Routine skin checks 7. Fluids/Electrolytes/Nutrition: Routine I&Os 8.Seizure disorder.Keppra 1000 mg twice a day 9.Dysphagia. Dysphagia #2 thin liquids/nasogastric tube feeds to supplement---remove when able to adequately meet nutritional needs--follow daily I's and O's. 10.Hypertension.Labetalol 200 mg BID -DBP elevated particularly.  -check bp's q shift and with therapy.  Controlled 10/20  Vitals:   09/05/17 0500 09/05/17 1500  BP: (!) 115/94 107/70  Pulse: 88 85  Resp: 17 18  Temp: 98.6 F (37 C) 98.2 F (36.8 C)  SpO2: 100% 100%   11.Leukocytosis. Improved.Afebrile CSF negative. Infectious disease follow-up as needed.All antibiotics have since been discontinued  WBCs 12.3 on 10/17  LOS (Days) 4 A FACE TO FACE EVALUATION WAS PERFORMED  [place] Depaz Karis Juba 09/05/2017, 5:48 PM

## 2017-09-06 ENCOUNTER — Inpatient Hospital Stay (HOSPITAL_COMMUNITY): Payer: Medicaid Other

## 2017-09-06 DIAGNOSIS — D62 Acute posthemorrhagic anemia: Secondary | ICD-10-CM

## 2017-09-06 NOTE — Progress Notes (Addendum)
Occupational Therapy Session Note  Patient Details  Name: Suzanne Brooks MRN: [id number] Date of Birth: Jun 05, 1976  Today's Date: 09/06/2017 OT Individual Time: 0800-0900 OT Individual Time Calculation (min): 60 min    Short Term Goals: Week 1:  OT Short Term Goal 1 (Week 1): Pt will sit EOB with min A during bathing task OT Short Term Goal 2 (Week 1): Pt will complete sit <> stand at sink with max A +1 in prep for functional task OT Short Term Goal 3 (Week 1): Pt will complete set-up of oral care task with no more than 1 VC for sequencing  Skilled Therapeutic Interventions/Progress Updates:    1:1. Pt stand pivot transfers throghout session BSC>w/c<>TTB>recliner with MOD A and VC for weight shifting anteriorly and hand placment throughout trnasfers. Pt received onBSC with NT in room. Pt able to complete posterior hygiene with supervision seated. Pt bathes at sit to stand level with MIN A for standing balanc, VC to sequence bathing body parts andtermination of bathing tasks. Pt able to dress at sit to stand level with MOD A for standing balacne with clothing management. Pt requries MAX VC to terminate applying lotion as pt applies lotion 7x despite cueing. Pt states, "my skin is just drinking it [lotion] up." Pt able to locate needed items on R with min VC and requires A to cross and thread RLE into pants/socks 2/2 decreased sustained attention. Pt easily distracted by internal and external stimuli past closed room door often making disjointed comments not related to conversation/current tasks. Exited session with NT feeding pt breakfast seated in recliner.  Therapy Documentation Precautions:  Precautions Precautions: Fall Restrictions Weight Bearing Restrictions: No  See Function Navigator for Current Functional Status.   Therapy/Group: Individual Therapy  Shon HaleStephanie M Nicholous Girgenti 09/06/2017, 9:00 AM

## 2017-09-06 NOTE — Progress Notes (Signed)
Physical Therapy Session Note  Patient Details  Name: Suzanne Brooks MRN: [id number] Date of Birth: [birth year]  Today's Date: 09/06/2017 PT Individual Time: [id number] PT Individual Time Calculation (min): 58 min   Short Term Goals: Week 1:  PT Short Term Goal 1 (Week 1): Pt will transfers with moderate assist from PT  PT Short Term Goal 2 (Week 1): Pt will ambulate 6530ft with max assist  PT Short Term Goal 3 (Week 1): Pt will maintain standing balance for 1 minute with moderate assist  PT Short Term Goal 4 (Week 1): Pt will perform bed mobility with moderate assist   Skilled Therapeutic Interventions/Progress Updates:    Pt supine in bed upon PT arrival, agreeable to therapy tx and denies pain. Pt transferred from supine to sitting EOB with min assist. Pt transferred bed<>w/c with mod assist, squat pivot transfer and verbal cues for sequencing. Pt propelled w/c 2 bouts x 75 ft with min assist and verbal cues to continue/attend to task. Pt practiced squat pivot transfers x 4 from w/c<>mat with min assist and verbal cues for technique. Pt performed sit<>stands x 2 at high table with mod assist in order to perform standing activity. While standing 2 x 3 minutes working on postural control, balance and R knee extension pt sorted bean bags by color and then was asked to name what each pile had in common, requiring some verbal cues. Pt reported needing to go to the bathroom. Pt transported back to room. Pt performed stand pivot transfer w/c<>toilet with mod assist. Pt sitting in w/c to wash hands. Pt transferred w/c>bed squat pivot with min assist and sitting>supine with mod assist. Pt left supine in bed at end of session with needs in reach and bed alarm set.   Therapy Documentation Precautions:  Precautions Precautions: Fall Restrictions Weight Bearing Restrictions: No   See Function Navigator for Current Functional Status.   Therapy/Group: Individual Therapy  Cresenciano GenreEmily van Schagen, PT,  DPT 09/06/2017, 12:32 PM

## 2017-09-06 NOTE — Plan of Care (Signed)
Problem: RH BOWEL ELIMINATION Goal: RH STG MANAGE BOWEL W/MEDICATION W/ASSISTANCE STG Manage Bowel with Medication with mod Assistance.  Outcome: Not Progressing Total assist-incont

## 2017-09-06 NOTE — Progress Notes (Signed)
Occupational Therapy Session Note  Patient Details  Name: Suzanne Brooks MRN: [id number] Date of Birth: [birth year]  Today's Date: 09/06/2017 OT Individual Time: [id number] OT Individual Time Calculation (min): 70 min    Short Term Goals: Week 1:  OT Short Term Goal 1 (Week 1): Pt will sit EOB with min A during bathing task OT Short Term Goal 2 (Week 1): Pt will complete sit <> stand at sink with max A +1 in prep for functional task OT Short Term Goal 3 (Week 1): Pt will complete set-up of oral care task with no more than 1 VC for sequencing  Skilled Therapeutic Interventions/Progress Updates:    1:1. Focus of session on stand pivot transfer with MOD A throughout sessio with VC for safety awareness , hand placement and sequencing of transfers. Pt sit tos tand at hight low table to play game of connect 4 with pieces on R for attention with Min A for balance and VC for weight shifting to L 2/2 pushing. Pt stands to play horse shoes reaching L crossing midline with R arm to facilitate weight shifting L with CGA for balance and VC for knee extension in R knee. Pt completes various FMC activities: threading beads in pattern (mod Vc to follow pattern), clothing fasteners and clothes pins in seated with supervision. Exited session with pt seated in bed with bed exit alarm on and call light in reach.   Therapy Documentation Precautions:  Precautions Precautions: Fall Restrictions Weight Bearing Restrictions: No General:    See Function Navigator for Current Functional Status.   Therapy/Group: Individual Therapy  Shon HaleStephanie M Hang Ammon 09/06/2017, 5:47 PM

## 2017-09-06 NOTE — Progress Notes (Signed)
Social Work Assessment and Plan  Patient Details  Name: Suzanne Brooks MRN: [id number] Date of Birth: [birth year]  Today's Date: 09/02/2017  Problem List:  Patient Active Problem List   Diagnosis Date Noted  . Acute blood loss anemia   . Benign essential HTN   . Leukocytosis   . Dysphagia   . Endotracheal tube present   . ICH (intracerebral hemorrhage) (HCC) 07/30/2017  . SAH (subarachnoid hemorrhage) (HCC) 07/30/2017  . Seizure-like activity (HCC)   . Acute respiratory failure with hypoxemia [facility])    Past Medical History:  Past Medical History:  Diagnosis Date  . Hypertension   . Seizures (HCC)    Past Surgical History:  Past Surgical History:  Procedure Laterality Date  . IR 3D INDEPENDENT WKST  07/30/2017  . IR ANGIO INTRA EXTRACRAN SEL INTERNAL CAROTID BILAT MOD SED  07/30/2017  . IR ANGIO VERTEBRAL SEL VERTEBRAL UNI R MOD SED  07/30/2017  . IR ANGIOGRAM FOLLOW UP STUDY  07/30/2017  . IR ANGIOGRAM FOLLOW UP STUDY  07/30/2017  . IR ANGIOGRAM FOLLOW UP STUDY  07/30/2017  . IR TRANSCATH/EMBOLIZ  07/30/2017  . LAPAROSCOPIC REVISION VENTRICULAR-PERITONEAL (V-P) SHUNT N/A 08/27/2017   Procedure: LAPAROSCOPIC REVISION VENTRICULAR-PERITONEAL (V-P) SHUNT;  Surgeon: Axel Filler, MD;  Location: [facility] OR;  Service: General;  Laterality: N/A;  . RADIOLOGY WITH ANESTHESIA N/A 07/30/2017   Procedure: RADIOLOGY WITH ANESTHESIA/ DR. Gerlene Burdock;  Surgeon: Radiologist, Medication, MD;  Location: [facility] OR;  Service: Radiology;  Laterality: N/A;  . VENTRICULOPERITONEAL SHUNT N/A 08/27/2017   Procedure: SHUNT INSERTION VENTRICULAR-PERITONEAL;  Surgeon: Lisbeth Renshaw, MD;  Location: [facility] OR;  Service: Neurosurgery;  Laterality: N/A;   Social History:  reports that she has never smoked. She has never used smokeless tobacco. She reports that she drinks alcohol. She reports that she uses drugs, including Marijuana.  Family / Support Systems Marital Status: Divorced Patient Roles: Parent (dtr,  signficant other, employee) Spouse/Significant Other: Suzanne Brooks) - significant other - [phone] Children: Suzanne Brooks - dtr - [phone]; Suzanne Brooks - dtr - [phone]; Suzanne Brooks - dtr - [phone];  also has a 43 y/o dtr [place]) with Suzanne Brooks and 26 y/o son (Suzanne Brooks) with an uncle in [place] Other Supports: Suzanne Brooks - mother - [phone] Anticipated Caregiver: unknown - family trying to figure this out Ability/Limitations of Caregiver: Daughter. Suzanne Marvel, does not work.  Has children, can assist, but has a flight of stairs and does not have reliable transportation.  She is wondering if pt would be better off to go to her mother's home. Caregiver Availability: Other (Comment) (unknown at this time) Family Dynamics: Family has not worked out where pt will actually d/c and to [employer] care.  Social History Preferred language: English Religion:  Read: Yes Write: Yes Employment Status: Employed (works as a Investment banker, operational, PT, in [employer].) Name of Employer: Dtr did not know. Return to Work Plans: This is unknown at this time, as it is based on pt's progress. Legal History/Current Legal Issues: none reported Guardian/Conservator: Pt does not have a guardian at this time, but MD has determined that pt is not capable at this time to make her own decisions.   Abuse/Neglect Physical Abuse:  (unsure, as this could not be assessed with pt due to cognition) Verbal Abuse:  (unsure, as this could not be assessed with pt due to cognition) Sexual Abuse:  (unsure, as this could not be assessed with pt due to cognition) Exploitation of patient/patient's resources:  (unsure, as this  could not be assessed with pt due to cognition) Self-Neglect:  (unsure, as this could not be assessed with pt due to cognition)  Emotional Status Pt's affect, behavior and adjustment status: Pt was not able to converse much with CSW.  She gave CSW permission to call her dtr, Suzanne Brooks, and her mother.  It is  difficult to assess pt's adjustment status at this time.  She was calm with CSW and pleasant. Recent Psychosocial Issues: Pt with a divorce in her past and dtr stated that was difficult for her and that pt "has been through a lot." Psychiatric History: none reported by dtr and nothing in pt's chart Substance Abuse History: UDS negative at time of admission, but could not discuss this yet with pt due to cognition.  Chart review shows that pt admitted to marijuana/drug use.  CSW will continue to assess as pt is able.  Patient / Family Perceptions, Expectations & Goals Pt/Family understanding of illness & functional limitations: This could not be assessed with pt.  Pt's dtr seems to understand pt's condition and that pt will have a lot of recovery time to come. Premorbid pt/family roles/activities: In addition to working and being with her family/friends, pt likes to sing and plan weddings/parties.  Dtr stated that pt is usually talkative and vibrant. Anticipated changes in roles/activities/participation: Pt's family would like for her to be able to resume activities, but know she has some work ahead of her. Pt/family expectations/goals: This could not yet be obtained from pt, but dtr would like for pt to get to a place where she is safe to come home.  Community Resources [employer] Agencies: Other (Comment) (social services) Premorbid Home Care/DME Agencies: None Transportation available at discharge: family Resource referrals recommended: Neuropsychology  Discharge Planning Living Arrangements: Children Support Systems: Children, Spouse/significant other, Other relatives, Parent, Friends/neighbors Type of Residence: Private residence Insurance Resources: Medicaid (specify county) Merchant navy officer([place]) Financial Resources: Employment Surveyor, quantityinancial Screen Referred: No Money Management: Patient Does the patient have any problems obtaining your medications?: No Home Management: pt  Patient/Family Preliminary  Plans: Pt's family is unsure where pt will go at d/c.  Dtr thinks her grandmother's (pt's mother's) home may be more accessible and [place] right [place], too, and could help.  CSW will continue to work with family on their plan. Social Work Anticipated Follow Up Needs: HH/OP, Support Group Expected length of stay: 3 weeks  Clinical Impression CSW attempted to meet with pt, but she was not able to engage in assessment.  She gave this CSW permission to talk with her dtr, Suzanne Brooks.  CSW called pt's dtr and spoke her via telephone to introduce self and role of CSW, as well as to try to complete assessment.  Suzanne Brooks is wanting to help care for her mother, but is concerned her home is not the best setup since the bedroom and bathroom are up a flight of stairs and she does not have consistent transportation.  She thinks her grandmother's home may be better and she can walk there to help.  CSW will reach out to pt's mother to see if this is a possibility.  Pt is usually talkative and full of life, per dtr.  She hopes that returns to pt and she is glad that pt is on CIR for three weeks.  CSW made her aware of the targeted d/c date of 09-25-17.  CSW will continue to follow and assist as needed.  Suzanne Brooks, Suzanne DeckJennifer Brooks 09/06/2017, 6:11 PM

## 2017-09-06 NOTE — Progress Notes (Signed)
Subjective/Complaints: Pt seen laying in bed this AM.  She is nonverbal and does not engage.   ROS-  Unable to assess due to cognition/participation  Objective: Vital Signs: Blood pressure (!) 124/91, pulse (!) 102, temperature 98.6 F (37 C), temperature source Oral, resp. rate 18, height 5\' 3"  (1.6 m), weight 60.5 kg (133 lb 6.1 oz), SpO2 98 %. No results found. Results for orders placed or performed during the hospital encounter of 09/01/17 (from the past 72 hour(s))  Urinalysis, Routine w reflex microscopic     Status: Abnormal   Collection Time: 09/05/17  6:40 PM  Result Value Ref Range   Color, Urine YELLOW YELLOW   APPearance CLOUDY (A) CLEAR   Specific Gravity, Urine 1.020 1.005 - 1.030   pH 6.0 5.0 - 8.0   Glucose, UA NEGATIVE NEGATIVE mg/dL   Hgb urine dipstick NEGATIVE NEGATIVE   Bilirubin Urine NEGATIVE NEGATIVE   Ketones, ur NEGATIVE NEGATIVE mg/dL   Protein, ur NEGATIVE NEGATIVE mg/dL   Nitrite POSITIVE (A) NEGATIVE   Leukocytes, UA MODERATE (A) NEGATIVE  Urinalysis, Microscopic (reflex)     Status: Abnormal   Collection Time: 09/05/17  6:40 PM  Result Value Ref Range   RBC / HPF NONE SEEN 0 - 5 RBC/hpf   WBC, UA 6-30 0 - 5 WBC/hpf   Bacteria, UA MANY (A) NONE SEEN   Squamous Epithelial / LPF 0-5 (A) NONE SEEN   Mucus PRESENT    Urine-Other LESS THAN 10 mL OF URINE SUBMITTED     Comment: MICROSCOPIC EXAM PERFORMED ON UNCONCENTRATED URINE     HEENT: Staples over ventriculostomy site and crani site, well healed. +NGT Cardio: RRR and no JVD Resp: CTA B/L. Unlabored. GI: BS positive, non tender Skin:   Other RIght upper chest wound from tunneled cath, CDI Neuro: Lethargic, Flat,  Nonverbal.  Motor 4+/5 B delt, bi, tri, grip previously (2/5 today - poor participation) RLE: 2-/5 HF, KE Ankle PF, 0 ankle DF Musc/Skel:  No edema. No tenderness. Gen NAD. Vital signs reviewed. Psych: Nonverbal.  Assessment/Plan: 1. Functional deficits secondary to Right  periopthalmic SAH with cognitive deficits, Left frontal IPH and gait disorder,RLE >LLE weakness  which require 3+ hours per day of interdisciplinary therapy in a comprehensive inpatient rehab setting. Physiatrist is providing close team supervision and 24 hour management of active medical problems listed below. Physiatrist and rehab team continue to assess barriers to discharge/monitor patient progress toward functional and medical goals. FIM: Function - Bathing Position: Shower Body parts bathed by patient: Right arm, Left arm, Chest, Abdomen, Front perineal area, Buttocks, Right upper leg, Left upper leg, Left lower leg, Right lower leg Body parts bathed by helper: Back Assist Level: Touching or steadying assistance(Pt > 75%)  Function- Upper Body Dressing/Undressing What is the patient wearing?: Hospital gown Pull over shirt/dress - Perfomed by patient: Thread/unthread right sleeve, Thread/unthread left sleeve, Put head through opening, Pull shirt over trunk Pull over shirt/dress - Perfomed by helper: Put head through opening, Pull shirt over trunk Assist Level: Touching or steadying assistance(Pt > 75%) Function - Lower Body Dressing/Undressing What is the patient wearing?: Pants Position: Wheelchair/chair at sink Pants- Performed by patient: Thread/unthread left pants leg Pants- Performed by helper: Thread/unthread left pants leg, Pull pants up/down Non-skid slipper socks- Performed by patient: Don/doff left sock Non-skid slipper socks- Performed by helper: Don/doff right sock Shoes - Performed by helper: Don/doff right shoe, Don/doff left shoe, Fasten right, Fasten left Assist for footwear: Partial/moderate assist Assist for  lower body dressing: Touching or steadying assistance (Pt > 75%)  Function - Toileting Toileting activity did not occur: No continent bowel/bladder event Toileting steps completed by patient: Performs perineal hygiene (no pants to complete clothing managment-  hospital gown) Toileting steps completed by helper: Adjust clothing prior to toileting, Performs perineal hygiene, Adjust clothing after toileting Assist level: Touching or steadying assistance (Pt.75%)  Function - Toilet Transfers Toilet transfer assistive device: Grab bar Mechanical lift: Stedy Assist level to toilet: Moderate assist (Pt 50 - 74%/lift or lower) Assist level from toilet: Moderate assist (Pt 50 - 74%/lift or lower)  Function - Chair/bed transfer Chair/bed transfer method: Stand pivot Chair/bed transfer assist level: Moderate assist (Pt 50 - 74%/lift or lower) Chair/bed transfer assistive device: Armrests Chair/bed transfer details: Manual facilitation for weight shifting, Verbal cues for safe use of DME/AE  Function - Locomotion: Wheelchair Type: Manual Max wheelchair distance: 13ft Assist Level: Touching or steadying assistance (Pt > 75%) Wheel 50 feet with 2 turns activity did not occur: Refused Assist Level: Touching or steadying assistance (Pt > 75%) Wheel 150 feet activity did not occur: Safety/medical concerns Turns around,maneuvers to table,bed, and toilet,negotiates 3% grade,maneuvers on rugs and over doorsills: No Function - Locomotion: Ambulation Ambulation activity did not occur: Safety/medical concerns Assistive device: Rail in hallway Max distance: 13ft  Assist level: Moderate assist (Pt 50 - 74%) Walk 10 feet activity did not occur: Safety/medical concerns Assist level: Moderate assist (Pt 50 - 74%) Walk 150 feet activity did not occur: Safety/medical concerns Walk 10 feet on uneven surfaces activity did not occur: Safety/medical concerns  Function - Comprehension Comprehension: Auditory Comprehension assist level: Understands basic 25 - 49% of the time/ requires cueing 50 - 75% of the time  Function - Expression Expression: Verbal Expression assist level: Expresses basic 25 - 49% of the time/requires cueing 50 - 75% of the time. Uses single  words/gestures.  Function - Social Interaction Social Interaction assist level: Interacts appropriately 25 - 49% of time - Needs frequent redirection.  Function - Problem Solving Problem solving assist level: Solves basic less than 25% of the time - needs direction nearly all the time or does not effectively solve problems and may need a restraint for safety  Function - Memory Memory assist level: Recognizes or recalls 25 - 49% of the time/requires cueing 50 - 75% of the time Patient normally able to recall (first 3 days only): That he or she is in a hospital  Medical Problem List and Plan: 1. 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 DVT Prophylaxis/Anticoagulation: SCDs 3. Pain Management: Tylenol as needed 4. Mood: Provide emotional support  Limited engagement 5. Neuropsych: This patient is notcapable of making decisions on herown behalf.  Fall safety precautions  Methylphenidate 5mg  qam and qnoon 6. Skin/Wound Care: Routine skin checks 7. Fluids/Electrolytes/Nutrition: Routine I&Os 8.Seizure disorder.Keppra 1000 mg twice a day 9.Dysphagia. Dysphagia #2 thin liquids/nasogastric tube feeds to supplement---remove when able to adequately meet nutritional needs--follow daily I's and O's. 10.Hypertension.Labetalol 200 mg BID -DBP elevated particularly.  -check bp's q shift and with therapy.  Controlled 10/21 Vitals:   09/05/17 2015 09/06/17 0350  BP: 112/69 (!) 124/91  Pulse: (!) 103 (!) 102  Resp:  18  Temp:  98.6 F (37 C)  SpO2:  98%   11.Leukocytosis. Improved.Afebrile CSF negative. Infectious disease follow-up as needed.All antibiotics have since been discontinued  WBCs 12.3 on 10/17 12. ABLA  Hb 8.4 on 10/17  Cont to monitor  LOS (Days) 5 A FACE TO FACE EVALUATION WAS PERFORMED  Ladasha Schnackenberg Karis Juba 09/06/2017, 2:09 PM

## 2017-09-07 ENCOUNTER — Inpatient Hospital Stay (HOSPITAL_COMMUNITY): Payer: Medicaid Other | Admitting: Physical Therapy

## 2017-09-07 ENCOUNTER — Inpatient Hospital Stay (HOSPITAL_COMMUNITY): Payer: Medicaid Other | Admitting: Speech Pathology

## 2017-09-07 ENCOUNTER — Inpatient Hospital Stay (HOSPITAL_COMMUNITY): Payer: Medicaid Other | Admitting: Occupational Therapy

## 2017-09-07 DIAGNOSIS — I629 Nontraumatic intracranial hemorrhage, unspecified: Secondary | ICD-10-CM

## 2017-09-07 DIAGNOSIS — R1314 Dysphagia, pharyngoesophageal phase: Secondary | ICD-10-CM

## 2017-09-07 LAB — URINE CULTURE

## 2017-09-07 MED ORDER — CAMPHOR-MENTHOL 0.5-0.5 % EX LOTN
TOPICAL_LOTION | CUTANEOUS | Status: DC | PRN
Start: 2017-09-07 — End: 2017-09-22
  Administered 2017-09-08 – 2017-09-10 (×2): via TOPICAL
  Filled 2017-09-07: qty 222

## 2017-09-07 MED ORDER — HYDROXYZINE HCL 10 MG PO TABS
10.0000 mg | ORAL_TABLET | Freq: Three times a day (TID) | ORAL | Status: DC | PRN
  Administered 2017-09-08 – 2017-09-13 (×7): 10 mg via ORAL
  Filled 2017-09-07 (×10): qty 1

## 2017-09-07 MED ORDER — CEPHALEXIN 250 MG PO CAPS
250.0000 mg | ORAL_CAPSULE | Freq: Three times a day (TID) | ORAL | Status: DC
  Administered 2017-09-07 – 2017-09-14 (×21): 250 mg via ORAL
  Filled 2017-09-07 (×21): qty 1

## 2017-09-07 NOTE — Progress Notes (Signed)
Subjective/Complaints: No issues overnight, sitting at the nurses station complaining of itching. She states she itches "all over including the scar, right side. Scalp   ROS-  Unable to assess due to cognition/participation  Objective: Vital Signs: Blood pressure 115/84, pulse 86, temperature (!) 97.5 F (36.4 C), temperature source Oral, resp. rate 16, height 5\' 3"  (1.6 m), weight 60 kg (132 lb 4.8 oz), SpO2 98 %. No results found. Results for orders placed or performed during the hospital encounter of 09/01/17 (from the past 72 hour(s))  Urinalysis, Routine w reflex microscopic     Status: Abnormal   Collection Time: 09/05/17  6:40 PM  Result Value Ref Range   Color, Urine YELLOW YELLOW   APPearance CLOUDY (A) CLEAR   Specific Gravity, Urine 1.020 1.005 - 1.030   pH 6.0 5.0 - 8.0   Glucose, UA NEGATIVE NEGATIVE mg/dL   Hgb urine dipstick NEGATIVE NEGATIVE   Bilirubin Urine NEGATIVE NEGATIVE   Ketones, ur NEGATIVE NEGATIVE mg/dL   Protein, ur NEGATIVE NEGATIVE mg/dL   Nitrite POSITIVE (A) NEGATIVE   Leukocytes, UA MODERATE (A) NEGATIVE  Urinalysis, Microscopic (reflex)     Status: Abnormal   Collection Time: 09/05/17  6:40 PM  Result Value Ref Range   RBC / HPF NONE SEEN 0 - 5 RBC/hpf   WBC, UA 6-30 0 - 5 WBC/hpf   Bacteria, UA MANY (A) NONE SEEN   Squamous Epithelial / LPF 0-5 (A) NONE SEEN   Mucus PRESENT    Urine-Other LESS THAN 10 mL OF URINE SUBMITTED     Comment: MICROSCOPIC EXAM PERFORMED ON UNCONCENTRATED URINE  Culture, Urine     Status: Abnormal   Collection Time: 09/05/17  6:41 PM  Result Value Ref Range   Specimen Description URINE, CLEAN CATCH    Special Requests NONE    Culture >=100,000 COLONIES/mL ESCHERICHIA COLI (A)    Report Status 09/07/2017 FINAL    Organism ID, Bacteria ESCHERICHIA COLI (A)       Susceptibility   Escherichia coli - MIC*    AMPICILLIN <=2 SENSITIVE Sensitive     CEFAZOLIN <=4 SENSITIVE Sensitive     CEFTRIAXONE <=1 SENSITIVE  Sensitive     CIPROFLOXACIN <=0.25 SENSITIVE Sensitive     GENTAMICIN <=1 SENSITIVE Sensitive     IMIPENEM <=0.25 SENSITIVE Sensitive     NITROFURANTOIN <=16 SENSITIVE Sensitive     TRIMETH/SULFA <=20 SENSITIVE Sensitive     AMPICILLIN/SULBACTAM <=2 SENSITIVE Sensitive     PIP/TAZO <=4 SENSITIVE Sensitive     Extended ESBL NEGATIVE Sensitive     * >=100,000 COLONIES/mL ESCHERICHIA COLI     HEENT: Staples over ventriculostomy site and crani site, well healed. +NGT Cardio: RRR and no JVD Resp: CTA B/L. Unlabored. GI: BS positive, non tender Skin:   Other RIght upper chest wound from tunneled cath, CDI Neuro: Lethargic, Flat,  Nonverbal.  Motor 4+/5 B delt, bi, tri, grip previously (2/5 today - poor participation) RLE: 2-/5 HF, KE Ankle PF, 0 ankle DF Musc/Skel:  No edema. No tenderness. Gen NAD. Vital signs reviewed. Psych: Nonverbal.  Assessment/Plan: 1. Functional deficits secondary to Right periopthalmic SAH with cognitive deficits, Left frontal IPH and gait disorder,RLE >LLE weakness  which require 3+ hours per day of interdisciplinary therapy in a comprehensive inpatient rehab setting. Physiatrist is providing close team supervision and 24 hour management of active medical problems listed below. Physiatrist and rehab team continue to assess barriers to discharge/monitor patient progress toward functional and medical goals. FIM:  Function - Bathing Position: Shower Body parts bathed by patient: Right arm, Left arm, Chest, Abdomen, Front perineal area, Buttocks, Right upper leg, Left upper leg, Left lower leg, Right lower leg Body parts bathed by helper: Back Assist Level: Touching or steadying assistance(Pt > 75%)  Function- Upper Body Dressing/Undressing What is the patient wearing?: Hospital gown Pull over shirt/dress - Perfomed by patient: Thread/unthread right sleeve, Thread/unthread left sleeve, Put head through opening, Pull shirt over trunk Pull over shirt/dress -  Perfomed by helper: Put head through opening, Pull shirt over trunk Assist Level: Touching or steadying assistance(Pt > 75%) Function - Lower Body Dressing/Undressing What is the patient wearing?: Pants Position: Wheelchair/chair at sink Pants- Performed by patient: Thread/unthread left pants leg Pants- Performed by helper: Thread/unthread left pants leg, Pull pants up/down Non-skid slipper socks- Performed by patient: Don/doff left sock Non-skid slipper socks- Performed by helper: Don/doff right sock Shoes - Performed by helper: Don/doff right shoe, Don/doff left shoe, Fasten right, Fasten left Assist for footwear: Partial/moderate assist Assist for lower body dressing: Touching or steadying assistance (Pt > 75%)  Function - Toileting Toileting activity did not occur: No continent bowel/bladder event Toileting steps completed by patient: Performs perineal hygiene Toileting steps completed by helper: Adjust clothing prior to toileting, Performs perineal hygiene, Adjust clothing after toileting Assist level: Touching or steadying assistance (Pt.75%)  Function - Archivist transfer assistive device: Bedside commode Mechanical lift: Stedy Assist level to toilet: Moderate assist (Pt 50 - 74%/lift or lower) Assist level from toilet: Moderate assist (Pt 50 - 74%/lift or lower) Assist level to bedside commode (at bedside): 2 helpers Assist level from bedside commode (at bedside): 2 helpers  Function - Chair/bed transfer Chair/bed transfer method: Squat pivot Chair/bed transfer assist level: Touching or steadying assistance (Pt > 75%) Chair/bed transfer assistive device: Armrests Chair/bed transfer details: Manual facilitation for weight shifting, Verbal cues for safe use of DME/AE  Function - Locomotion: Wheelchair Type: Manual Max wheelchair distance: 63ft Assist Level: Touching or steadying assistance (Pt > 75%) Wheel 50 feet with 2 turns activity did not occur:  Refused Assist Level: Touching or steadying assistance (Pt > 75%) Wheel 150 feet activity did not occur: Safety/medical concerns Turns around,maneuvers to table,bed, and toilet,negotiates 3% grade,maneuvers on rugs and over doorsills: No Function - Locomotion: Ambulation Ambulation activity did not occur: Safety/medical concerns Assistive device: Rail in hallway Max distance: 93ft  Assist level: Moderate assist (Pt 50 - 74%) Walk 10 feet activity did not occur: Safety/medical concerns Assist level: Moderate assist (Pt 50 - 74%) Walk 150 feet activity did not occur: Safety/medical concerns Walk 10 feet on uneven surfaces activity did not occur: Safety/medical concerns  Function - Comprehension Comprehension: Auditory Comprehension assist level: Understands basic 25 - 49% of the time/ requires cueing 50 - 75% of the time  Function - Expression Expression: Verbal Expression assist level: Expresses basic 25 - 49% of the time/requires cueing 50 - 75% of the time. Uses single words/gestures.  Function - Social Interaction Social Interaction assist level: Interacts appropriately 25 - 49% of time - Needs frequent redirection.  Function - Problem Solving Problem solving assist level: Solves basic 25 - 49% of the time - needs direction more than half the time to initiate, plan or complete simple activities  Function - Memory Memory assist level: Recognizes or recalls 25 - 49% of the time/requires cueing 50 - 75% of the time Patient normally able to recall (first 3 days only): That he or she is  in a hospital  Medical Problem List and Plan: 1. 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 DVT Prophylaxis/Anticoagulation: SCDs 3. Pain Management: Tylenol as needed 4. Mood: Provide emotional support  Activation has improved, will DC Ritalin given complaints of itching,  most recent medication 5. Neuropsych: This patient is notcapable of making decisions on herown behalf.  Fall safety precautions  6. Skin/Wound Care: Routine skin checks 7. Fluids/Electrolytes/Nutrition: Routine I&Os 8.Seizure disorder.Keppra 1000 mg twice a day 9.Dysphagia. Dysphagia #2 thin liquids/nasogastric tube feeds to supplement---remove when able to adequately meet nutritional needs--follow daily I's and O's. 10.Hypertension.Labetalol 200 mg BID -DBP elevated particularly.  -check bp's q shift and with therapy.  Controlled 10/21 Vitals:   09/06/17 1956 09/07/17 0515  BP: 137/88 115/84  Pulse: 91 86  Resp:  16  Temp:  (!) 97.5 F (36.4 C)  SpO2:  98%   11.Leukocytosis. Likely UTI      12. ABLA  Hb 8.4 on 10/17  Cont to monitor 13. UTI, Escherichia coli, start Keflex LOS (Days) 6 A FACE TO FACE EVALUATION WAS PERFORMED  Cathryn Gallery E 09/07/2017, 9:50 AM

## 2017-09-07 NOTE — Progress Notes (Signed)
Speech Language Pathology Daily Session Note  Patient Details  Name: Suzanne CatenaChristie Brooks MRN: [id number] Date of Birth: 1976/10/17  Today's Date: 09/07/2017   Treatment session #1 SLP Individual Time: [id number] SLP Individual Time Calculation (min): 45 min   Treatment session #2 SLP Individual Time: 1300-1400 SLP Individual Time Calculation (min): 60 min  Short Term Goals: Week 1: SLP Short Term Goal 1 (Week 1): Pt will sustain attention to basic task for ~ 15 minutes with Min A cues.  SLP Short Term Goal 2 (Week 1): Pt will initiate basic familiar task within 50% of opportunities and Mod A cues.  SLP Short Term Goal 3 (Week 1): Pt will verbally respond to question in timely manner in 50% of opportunities and Mod A cues.  SLP Short Term Goal 4 (Week 1): Pt will increase vocal intensity to achieve ~ 75% intelligibility at the sentence level with Mod A cues.  SLP Short Term Goal 5 (Week 1): Pt will demonstrate consistent orientation x 4 with Min A cues.  SLP Short Term Goal 6 (Week 1): Pt will consume dysphagia 2 diet with thin liquids and minimal overt s/s of aspiration with supervision cues for use of compensatory swallow strategies.   Skilled Therapeutic Interventions:  Skilled treatment session #1 focused on cognition goals. SLP facilitated session by providing Max A cues to sustain attention for ~ 10 minutes to task. Pt able to initiate and respond to questions in timely manner (much improved) however pt with no problem solving ability with simple sorting task (beads by color - beads went everywhere without any concern by pt). Pt required Min A for transfer via Stedy to bathroom but was already incontinent. Pt with increased alertness and participate in session.  Skilled treatment session #2 focused on cognition goals. SLP facilitated session by providing Max A to awake and transfer to wheelchair. Pt largely disinterested in ST. However after in wheelchair, pt able to participate. SLP  provided skilled observation of pt consuming dysphagia 2 lunch tray with thin liquids and no overt s/s of aspiration. Additionally, pt fed herself 75% of meal with no cues. Pt maintained attention to task for ~ 30 minutes and then perseverative on needing to "use the bathroom." Encouragement given to toilet in room (within next 15 minutes). However pt dry and stated that she only wanted to go to bed. SLP transferred pt back to bed and pt was confused as to time. Orientation provided. Pt left in bed, bed alarm on and all needs within reach. Continue per current plan of care.      Function:   Cognition Comprehension Comprehension assist level: Understands basic 25 - 49% of the time/ requires cueing 50 - 75% of the time  Expression   Expression assist level: Expresses basic 25 - 49% of the time/requires cueing 50 - 75% of the time. Uses single words/gestures.  Social Interaction Social Interaction assist level: Interacts appropriately 25 - 49% of time - Needs frequent redirection.  Problem Solving    Memory Memory assist level: Recognizes or recalls 25 - 49% of the time/requires cueing 50 - 75% of the time    Pain    Therapy/Group: Individual Therapy  Suzanne Brooks 09/07/2017, 12:34 PM

## 2017-09-07 NOTE — Progress Notes (Signed)
Occupational Therapy Session Note  Patient Details  Name: Suzanne Brooks MRN: [id number] Date of Birth: January 20, 1976  Today's Date: 09/07/2017 OT Individual Time: 0950-1100 OT Individual Time Calculation (min): 70 min    Short Term Goals: Week 1:  OT Short Term Goal 1 (Week 1): Pt will sit EOB with min A during bathing task OT Short Term Goal 2 (Week 1): Pt will complete sit <> stand at sink with max A +1 in prep for functional task OT Short Term Goal 3 (Week 1): Pt will complete set-up of oral care task with no more than 1 VC for sequencing  Skilled Therapeutic Interventions/Progress Updates:    Pt seen for OT ADL bathing/dressing session. Alerted by staff that pt perseverative on taking shower throughout this morning, unable to be redirected to any other tasks. Pt received seated in w/c at nurses station, agreeable to tx session and ready for shower. She self propelled w/c to room using B UEs with min A and VCs for awareness to environmental obstacles and technique. Completed stand pivot transfers throughout session with min A using grab bars. She bathed with supervision, standing with supervision at grab bar to complete pericare/ buttock hygiene. Pt taking significantly increased time in shower, unsure if this was her baseline or perseverative behaviors.  She returned to w/c to dress, significantly increased time to apply lotion and dress as pt very easily distracted by internal and external factors, constantly having to be re-directed to task. Max cuing to attend to R LE to thread pants, ultimately following VCs for hemi technique in order to complete task. She stood at sink with mod steadying assist to pull up pants due to posterior lean without awareness. Pt returned to nurses station at end of session, left seated in w/c for supervision with QRB donned.  Pt displaying some awareness of deficits today, stating several times "why is my R leg so heavy?" "I can't feel my leg", despite attempted  education regarding effects of stroke, pt with decreased awareness of functional implications of weakness/ decreased sensation.   Therapy Documentation Precautions:  Precautions Precautions: Fall Restrictions Weight Bearing Restrictions: No Pain:   No/ denies pain  See Function Navigator for Current Functional Status.   Therapy/Group: Individual Therapy  Lewis, Sommer Spickard C 09/07/2017, 7:13 AM

## 2017-09-07 NOTE — Progress Notes (Signed)
Tele monitor initiated., Impulsive behavior,recent fall, High Risk Precaution, Patient edicated on system for safety and behavior.

## 2017-09-07 NOTE — Progress Notes (Signed)
Day 3 of 3: Calorie Count Note  72 hour calorie count ordered.  Diet: Dysphagia 2 with thin liquids Supplements: Ensure Enlive po TID, each supplement provides 350 kcal and 20 grams of protein.  Breakfast: 130 kcal, 6 grams of protein Lunch: 138 kcal, 8 grams of protein Dinner: 0% Supplements: 613 kcal, 35 grams of protein  Day 3 Total intake: 881 kcal (48% of minimum estimated needs)  51 grams of protein (61% of minimum estimated needs)  Estimated Nutritional Needs:  Kcal:  1850-2050 Protein:  80-95 grams Fluid:  1.8 - 2 L/day  Most meal completion has been 0-25%. Pt has been consuming Ensure shakes however supplement completion too has been varied. Recommend continuation of nocturnal tube feeds until PO intake becomes adequate.   Nutrition Dx:  Inadequate oral intake related to dysphagia as evidenced by meal completion < 50%; ongoing  Goal:  Patient will meet greater than or equal to 90% of their needs; progressing  Intervention:   Discontinue Calorie count.   Continue nocturnal tube feeds of Vital 1.5 formula via Cortrak NGT at new goal rate of 60 ml/hr x 12 hours (7pm-7am) to provide 1080 kcal (58% of kcal needs), 49 grams of protein (61% of protein needs), and 547 ml of water.   Continue free water flushes per tube until fluids are adequate.    Provide Ensure Enlive po TID, each supplement provides 350 kcal and 20 grams of protein.  Roslyn SmilingStephanie Kennady Zimmerle, MS, RD, LDN Pager # [phone] After hours/ weekend pager # [phone]

## 2017-09-07 NOTE — Progress Notes (Signed)
Physical Therapy Session Note  Patient Details  Name: Suzanne Brooks MRN: [id number] Date of Birth: November 12, 1976  Today's Date: 09/07/2017 PT Individual Time: 1415-1500 PT Individual Time Calculation (min): 45 min   Short Term Goals: Week 1:  PT Short Term Goal 1 (Week 1): Pt will transfers with moderate assist from PT  PT Short Term Goal 2 (Week 1): Pt will ambulate 38f with max assist  PT Short Term Goal 3 (Week 1): Pt will maintain standing balance for 1 minute with moderate assist  PT Short Term Goal 4 (Week 1): Pt will perform bed mobility with moderate assist   Skilled Therapeutic Interventions/Progress Updates: Pt presented in bed agreeable to therapy. Pt performed supine to sit with min guard and increased time. Performed squat pivot to w/c with minA. Pt propelled 1068fwith BUE with minA cues for directional support and avoidance of obstacles L/R. Performed sit to/from stand x 4 with modA and cues for increased wt shift on RLE. Gait training 7070fith shopping cart. Pt with decreased R foot clearance and narrow BOS. Pt with significant R lean which was only moderately corrected with cues. Pt transported back to room and returned to bed with call bell within reach and needs met.      Therapy Documentation Precautions:  Precautions Precautions: Fall Restrictions Weight Bearing Restrictions: No General:   Vital Signs: Therapy Vitals Temp: 97.7 F (36.5 C) Temp Source: Oral Pulse Rate: 83 Resp: 16 BP: 122/81 Patient Position (if appropriate): Lying Oxygen Therapy SpO2: 100 % O2 Device: Not Delivered  See Function Navigator for Current Functional Status.   Therapy/Group: Individual Therapy  Love [place]  Jerzy Roepke, PTA  09/07/2017, 4:11 PM

## 2017-09-07 NOTE — Progress Notes (Signed)
   09/07/17 1700  What Happened  Was fall witnessed? No  Was patient injured? No  Patient found on floor  Found by Staff-comment (Student RN found patient seated on floor)  Stated prior activity to/from bed, chair, or stretcher  Follow Up  MD notified Deatra Inaan Angiulli, PA  Time MD notified [phone]  Family notified No- patient refusal  Additional tests No  Progress note created (see row info) Yes  Adult Fall Risk Assessment  Risk Factor Category (scoring not indicated) Fall has occurred during this admission (document High fall risk)  Patient's Fall Risk High Fall Risk (>13 points)  Adult Fall Risk Interventions  Required Bundle Interventions *See Row Information* High fall risk - low, moderate, and high requirements implemented  Additional Interventions Lap belt while in chair/wheelchair;Other (Comment) (Floor mat added)  Screening for Fall Injury Risk  Risk For Fall Injury- See Row Information  None identified  Vitals  BP (!) 164/104  BP Location Right Arm  BP Method Automatic  Patient Position (if appropriate) Sitting  Pulse Rate 79  Pulse Rate Source Dinamap  Resp 20  Oxygen Therapy  SpO2 100 %  O2 Device Room Air  Pain Assessment  Pain Assessment No/denies pain  Pain Score 0  PCA/Epidural/Spinal Assessment  Respiratory Pattern Regular;Unlabored  Neurological  Neuro (WDL) X  Level of Consciousness Alert  Orientation Level Oriented to person;Disoriented to time;Disoriented to place;Disoriented to situation  Cognition Other (Comment) (impulsive, delayed responses)  Speech Clear;Delayed responses  R Hand Grip Strong  L Hand Grip Strong  R Foot Dorsiflexion Weak  L Foot Dorsiflexion Weak  R Foot Plantar Flexion Weak  L Foot Plantar Flexion Weak  RUE Motor Response Purposeful movement  RUE Sensation Decreased  RUE Motor Strength 4  LUE Motor Response Purposeful movement  LUE Sensation Full sensation  LUE Motor Strength 4  RLE Motor Response Purposeful movement  RLE  Sensation Decreased  RLE Motor Strength 3  LLE Motor Response Purposeful movement  LLE Sensation Full sensation  LLE Motor Strength 4  Neuro Symptoms Forgetful  Neuro symptoms relieved by Rest

## 2017-09-08 ENCOUNTER — Inpatient Hospital Stay (HOSPITAL_COMMUNITY): Payer: Medicaid Other

## 2017-09-08 ENCOUNTER — Inpatient Hospital Stay (HOSPITAL_COMMUNITY): Payer: Medicaid Other | Admitting: Occupational Therapy

## 2017-09-08 ENCOUNTER — Inpatient Hospital Stay (HOSPITAL_COMMUNITY): Payer: Medicaid Other | Admitting: Physical Therapy

## 2017-09-08 NOTE — Progress Notes (Signed)
Occupational Therapy Session Note  Patient Details  Name: Suzanne Brooks MRN: [id number] Date of Birth: [birth year]  Today's Date: 09/08/2017 OT Individual Time: 0900-1000 OT Individual Time Calculation (min): 60 min    Short Term Goals: Week 1:  OT Short Term Goal 1 (Week 1): Pt will sit EOB with min A during bathing task OT Short Term Goal 2 (Week 1): Pt will complete sit <> stand at sink with max A +1 in prep for functional task OT Short Term Goal 3 (Week 1): Pt will complete set-up of oral care task with no more than 1 VC for sequencing  Skilled Therapeutic Interventions/Progress Updates:    1:1 LTG upgraded due to progress and alertness. Pt able to come to EOB with minimal A with extra time. Pt performed all stand step transfers with min A with significant right foot drop. Pt able to bathe with mod VC for attention but with steady A for balance. Pt able to void on commode with extra time to void. Pt dressed with extra time and mod A overall due to attention and Left LE decr coordination. Pt reports right LE continues to feel heavy and with decr coordination. Pt participated in eating breakfast with setup.  Environment started off with minimal distraction but progressed to moderate distracting pt required mod A for attention to complete feeding. Left up at the RN station with quick release belt in recliner.   Therapy Documentation Precautions:  Precautions Precautions: Fall Restrictions Weight Bearing Restrictions: No \\Pain :  no c/o pain just fatigue See Function Navigator for Current Functional Status.   Therapy/Group: Individual Therapy  Roney MansSmith, Emiliana Blaize [facility] 09/08/2017, 9:50 AM

## 2017-09-08 NOTE — Progress Notes (Signed)
Physical Therapy Session Note  Patient Details  Name: Suzanne Brooks MRN: [id number] Date of Birth: 08-Feb-1976  Today's Date: 09/08/2017 PT Individual Time: 1100-1200 and 1300-1400 PT Individual Time Calculation (min): 60 min and 60 min  Short Term Goals: Week 1:  PT Short Term Goal 1 (Week 1): Pt will transfers with moderate assist from PT  PT Short Term Goal 2 (Week 1): Pt will ambulate 5830ft with max assist  PT Short Term Goal 3 (Week 1): Pt will maintain standing balance for 1 minute with moderate assist  PT Short Term Goal 4 (Week 1): Pt will perform bed mobility with moderate assist   Skilled Therapeutic Interventions/Progress Updates: Tx1: Pt presented in bed agreeable to therapy. Performed bed mobility to supine with supervision and increased time with use of features. PTA donned and trialed AFO due to poor dorsiflexion in R foot. Pt performed stand pivot with HHA with minA and noted improved foot placement. Pt indicated need to use toilet, performed stand pivot at toilet with use of wall rail and min guard. Pt able to lower clothing with supervision (+void). Pt propelled to rehab gym with supervision and mod cues for safety with negotiation around objects. Performed stand pivot with shoes/AFO and min guard. Pt participated in standing balance ball toss with min challenges and minA due to posterior LOB x 3. Pt ambulated HHA x 7010ft to w/c and transported back to room. Pt returned to bed in same manner as prior and left with call bell within reach and bed alarm set.   Tx2: Pt presented in bed agreeable to therapy. Pt performed supine to sit min guard with increased time. PTA donned shoes total assist for time management. Pt performed stand pivot minA to w/c and transported to kitchen where pt participated in standing balance activities reaching for objects in cabinets using single UE support on counter. Pt required minA for sit to stand from w/c and minA for maintaining balance at counter  however no LOB. Pt performed gait training with RAFO and shopping cart 17048ft with minA and cues for R foot clearance. Pt then performed gait with RW 6883ft minA for DME management and maintaining R foot clearance. Pt transported back to room and returned to bed in same manner as prior. Pt left in bed with call bell within reach and bed alarm on.      Therapy Documentation Precautions:  Precautions Precautions: Fall Restrictions Weight Bearing Restrictions: No General:   Vital Signs: Therapy Vitals Temp: 98.7 F (37.1 C) Temp Source: Oral Pulse Rate: 86 Resp: 16 BP: 112/80 Patient Position (if appropriate): Lying Oxygen Therapy SpO2: 100 % O2 Device: Not Delivered  See Function Navigator for Current Functional Status.   Therapy/Group: Individual Therapy  Suzanne Brooks  Suzanne Brooks, PTA  09/08/2017, 4:01 PM

## 2017-09-08 NOTE — Progress Notes (Signed)
Subjective/Complaints:   ROS-  Unable to assess due to cognition/participation  Objective: Vital Signs: Blood pressure (!) 136/92, pulse 86, temperature 98.6 F (37 C), temperature source Oral, resp. rate 18, height 5\' 3"  (1.6 m), weight 59.6 kg (131 lb 6.3 oz), SpO2 100 %. No results found. Results for orders placed or performed during the hospital encounter of 09/01/17 (from the past 72 hour(s))  Urinalysis, Routine w reflex microscopic     Status: Abnormal   Collection Time: 09/05/17  6:40 PM  Result Value Ref Range   Color, Urine YELLOW YELLOW   APPearance CLOUDY (A) CLEAR   Specific Gravity, Urine 1.020 1.005 - 1.030   pH 6.0 5.0 - 8.0   Glucose, UA NEGATIVE NEGATIVE mg/dL   Hgb urine dipstick NEGATIVE NEGATIVE   Bilirubin Urine NEGATIVE NEGATIVE   Ketones, ur NEGATIVE NEGATIVE mg/dL   Protein, ur NEGATIVE NEGATIVE mg/dL   Nitrite POSITIVE (A) NEGATIVE   Leukocytes, UA MODERATE (A) NEGATIVE  Urinalysis, Microscopic (reflex)     Status: Abnormal   Collection Time: 09/05/17  6:40 PM  Result Value Ref Range   RBC / HPF NONE SEEN 0 - 5 RBC/hpf   WBC, UA 6-30 0 - 5 WBC/hpf   Bacteria, UA MANY (A) NONE SEEN   Squamous Epithelial / LPF 0-5 (A) NONE SEEN   Mucus PRESENT    Urine-Other LESS THAN 10 mL OF URINE SUBMITTED     Comment: MICROSCOPIC EXAM PERFORMED ON UNCONCENTRATED URINE  Culture, Urine     Status: Abnormal   Collection Time: 09/05/17  6:41 PM  Result Value Ref Range   Specimen Description URINE, CLEAN CATCH    Special Requests NONE    Culture >=100,000 COLONIES/mL ESCHERICHIA COLI (A)    Report Status 09/07/2017 FINAL    Organism ID, Bacteria ESCHERICHIA COLI (A)       Susceptibility   Escherichia coli - MIC*    AMPICILLIN <=2 SENSITIVE Sensitive     CEFAZOLIN <=4 SENSITIVE Sensitive     CEFTRIAXONE <=1 SENSITIVE Sensitive     CIPROFLOXACIN <=0.25 SENSITIVE Sensitive     GENTAMICIN <=1 SENSITIVE Sensitive     IMIPENEM <=0.25 SENSITIVE Sensitive    NITROFURANTOIN <=16 SENSITIVE Sensitive     TRIMETH/SULFA <=20 SENSITIVE Sensitive     AMPICILLIN/SULBACTAM <=2 SENSITIVE Sensitive     PIP/TAZO <=4 SENSITIVE Sensitive     Extended ESBL NEGATIVE Sensitive     * >=100,000 COLONIES/mL ESCHERICHIA COLI     HEENT: Staples over ventriculostomy site and crani site, well healed. +NGT Cardio: RRR and no JVD Resp: CTA B/L. Unlabored. GI: BS positive, non tender Skin:   Other RIght upper chest wound from tunneled cath, CDI Neuro: Lethargic, Flat,  Nonverbal.  Motor 4+/5 B delt, bi, tri, grip previously (2/5 today - poor participation) RLE: 2-/5 HF, KE Ankle PF, 0 ankle DF Musc/Skel:  No edema. No tenderness. Gen NAD. Vital signs reviewed. Psych: Nonverbal.  Assessment/Plan: 1. Functional deficits secondary to Right periopthalmic SAH with cognitive deficits, Left frontal IPH and gait disorder,RLE >LLE weakness  which require 3+ hours per day of interdisciplinary therapy in a comprehensive inpatient rehab setting. Physiatrist is providing close team supervision and 24 hour management of active medical problems listed below. Physiatrist and rehab team continue to assess barriers to discharge/monitor patient progress toward functional and medical goals. FIM: Function - Bathing Position: Shower Body parts bathed by patient: Right arm, Left arm, Chest, Abdomen, Front perineal area, Buttocks, Right upper leg, Left upper  leg, Left lower leg, Right lower leg Body parts bathed by helper: Back Assist Level: Touching or steadying assistance(Pt > 75%)  Function- Upper Body Dressing/Undressing What is the patient wearing?: Pull over shirt/dress Pull over shirt/dress - Perfomed by patient: Thread/unthread right sleeve, Thread/unthread left sleeve, Put head through opening, Pull shirt over trunk Pull over shirt/dress - Perfomed by helper: Put head through opening, Pull shirt over trunk Assist Level: Supervision or verbal cues Function - Lower Body  Dressing/Undressing What is the patient wearing?: Pants, Non-skid slipper socks Position: Wheelchair/chair at sink Pants- Performed by patient: Thread/unthread right pants leg, Thread/unthread left pants leg, Pull pants up/down Pants- Performed by helper: Thread/unthread left pants leg, Pull pants up/down Non-skid slipper socks- Performed by patient: Don/doff right sock, Don/doff left sock Non-skid slipper socks- Performed by helper: Don/doff right sock Shoes - Performed by helper: Don/doff right shoe, Don/doff left shoe, Fasten right, Fasten left Assist for footwear: Supervision/touching assist Assist for lower body dressing: Touching or steadying assistance (Pt > 75%)  Function - Toileting Toileting activity did not occur: No continent bowel/bladder event Toileting steps completed by patient: Performs perineal hygiene Toileting steps completed by helper: Adjust clothing prior to toileting, Performs perineal hygiene, Adjust clothing after toileting Assist level: Touching or steadying assistance (Pt.75%)  Function - Archivist transfer assistive device: Bedside commode Mechanical lift: Stedy Assist level to toilet: Moderate assist (Pt 50 - 74%/lift or lower) Assist level from toilet: Moderate assist (Pt 50 - 74%/lift or lower) Assist level to bedside commode (at bedside): 2 helpers Assist level from bedside commode (at bedside): 2 helpers  Function - Chair/bed transfer Chair/bed transfer method: Squat pivot Chair/bed transfer assist level: Touching or steadying assistance (Pt > 75%) Chair/bed transfer assistive device: Armrests Chair/bed transfer details: Manual facilitation for weight shifting, Verbal cues for safe use of DME/AE  Function - Locomotion: Wheelchair Type: Manual Max wheelchair distance: 76ft Assist Level: Touching or steadying assistance (Pt > 75%) Wheel 50 feet with 2 turns activity did not occur: Refused Assist Level: Touching or steadying assistance  (Pt > 75%) Wheel 150 feet activity did not occur: Safety/medical concerns Turns around,maneuvers to table,bed, and toilet,negotiates 3% grade,maneuvers on rugs and over doorsills: No Function - Locomotion: Ambulation Ambulation activity did not occur: Safety/medical concerns Assistive device: Other (comment) (shopping cart) Max distance: 77ft Assist level: Moderate assist (Pt 50 - 74%) Walk 10 feet activity did not occur: Safety/medical concerns Assist level: Moderate assist (Pt 50 - 74%) Walk 150 feet activity did not occur: Safety/medical concerns Walk 10 feet on uneven surfaces activity did not occur: Safety/medical concerns  Function - Comprehension Comprehension: Auditory Comprehension assist level: Understands basic 25 - 49% of the time/ requires cueing 50 - 75% of the time  Function - Expression Expression: Verbal Expression assist level: Expresses basic 25 - 49% of the time/requires cueing 50 - 75% of the time. Uses single words/gestures.  Function - Social Interaction Social Interaction assist level: Interacts appropriately 25 - 49% of time - Needs frequent redirection.  Function - Problem Solving Problem solving assist level: Solves basic 25 - 49% of the time - needs direction more than half the time to initiate, plan or complete simple activities  Function - Memory Memory assist level: Recognizes or recalls 25 - 49% of the time/requires cueing 50 - 75% of the time Patient normally able to recall (first 3 days only): That he or she is in a hospital  Medical Problem List and Plan: 1. 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 DVT Prophylaxis/Anticoagulation: SCDs 3. Pain Management: Tylenol as needed 4. Mood: Provide emotional support  morte lethargic , likley due to atarax will d/c, off ritalin 5. Neuropsych: This patient is  notcapable of making decisions on herown behalf.  Fall safety precautions  6. Skin/Wound Care: Routine skin checks 7. Fluids/Electrolytes/Nutrition: Routine I&Os 8.Seizure disorder.Keppra 1000 mg twice a day 9.Dysphagia. Dysphagia #2 thin liquids/nasogastric tube feeds to supplement---remove when able to adequately meet nutritional needs--follow daily I's and O's. 10.Hypertension.Labetalol 200 mg BID -DBP elevated particularly.  -check bp's q shift and with therapy.  Controlled 10/23 Vitals:   09/08/17 0000 09/08/17 0421  BP: 119/77 (!) 136/92  Pulse: 91 86  Resp:  18  Temp:  98.6 F (37 C)  SpO2: 100% 100%   11.Leukocytosis. Likely UTI      12. ABLA  Hb 8.4 on 10/17  Cont to monitor 13. UTI, Escherichia coli, start Keflex- Sensitive LOS (Days) 7 A FACE TO FACE EVALUATION WAS PERFORMED  Abdirahman Chittum E 09/08/2017, 8:37 AM

## 2017-09-08 NOTE — Progress Notes (Signed)
Speech Language Pathology Daily Session Note  Patient Details  Name: Suzanne Brooks MRN: [id number] Date of Birth: February 19, 1976  Today's Date: 09/08/2017 SLP Individual Time: 1000-1030 SLP Individual Time Calculation (min): 30 min  Short Term Goals: Week 1: SLP Short Term Goal 1 (Week 1): Pt will sustain attention to basic task for ~ 15 minutes with Min A cues.  SLP Short Term Goal 2 (Week 1): Pt will initiate basic familiar task within 50% of opportunities and Mod A cues.  SLP Short Term Goal 3 (Week 1): Pt will verbally respond to question in timely manner in 50% of opportunities and Mod A cues.  SLP Short Term Goal 4 (Week 1): Pt will increase vocal intensity to achieve ~ 75% intelligibility at the sentence level with Mod A cues.  SLP Short Term Goal 5 (Week 1): Pt will demonstrate consistent orientation x 4 with Min A cues.  SLP Short Term Goal 6 (Week 1): Pt will consume dysphagia 2 diet with thin liquids and minimal overt s/s of aspiration with supervision cues for use of compensatory swallow strategies.   Skilled Therapeutic Interventions: Skilled ST services focused on swallow and cognitive skills. SLP facilitated PO consumption trials of dys 3, pt demonstrated no overt s/s aspiration and appropriate oral clearing of dys 3 textured foods, recommend ordering trial meal tray. Pt demonstrated ability to initiate functional tasks, po consumption and transferring from chair to bed, with 70% accuracy given Mod a verbal and visual cues and responded to verbal questions in 70% opportunities with Mod A verbal cues and extended time.Pt was left in room with call bell within reach. Recommend to continue ST services.     Function:  Eating Eating   Modified Consistency Diet: Yes Eating Assist Level: More than reasonable amount of time           Cognition Comprehension Comprehension assist level: Understands basic 25 - 49% of the time/ requires cueing 50 - 75% of the time  Expression    Expression assist level: Expresses basic 25 - 49% of the time/requires cueing 50 - 75% of the time. Uses single words/gestures.  Social Interaction Social Interaction assist level: Interacts appropriately 25 - 49% of time - Needs frequent redirection.  Problem Solving Problem solving assist level: Solves basic 25 - 49% of the time - needs direction more than half the time to initiate, plan or complete simple activities  Memory Memory assist level: Recognizes or recalls 25 - 49% of the time/requires cueing 50 - 75% of the time    Pain Pain Assessment Pain Assessment: No/denies pain  Therapy/Group: Individual Therapy  Suzanne Brooks  [facility] 09/08/2017, 4:29 PM

## 2017-09-09 ENCOUNTER — Inpatient Hospital Stay (HOSPITAL_COMMUNITY): Payer: Medicaid Other | Admitting: Physical Therapy

## 2017-09-09 ENCOUNTER — Inpatient Hospital Stay (HOSPITAL_COMMUNITY): Payer: Medicaid Other

## 2017-09-09 ENCOUNTER — Encounter (HOSPITAL_COMMUNITY): Payer: Medicaid Other | Admitting: Occupational Therapy

## 2017-09-09 ENCOUNTER — Inpatient Hospital Stay (HOSPITAL_COMMUNITY): Payer: Medicaid Other | Admitting: Speech Pathology

## 2017-09-09 DIAGNOSIS — R1313 Dysphagia, pharyngeal phase: Secondary | ICD-10-CM

## 2017-09-09 MED ORDER — LEVETIRACETAM 500 MG PO TABS
500.0000 mg | ORAL_TABLET | Freq: Two times a day (BID) | ORAL | Status: DC
  Administered 2017-09-09 – 2017-09-22 (×26): 500 mg via ORAL
  Filled 2017-09-09 (×26): qty 1

## 2017-09-09 MED ORDER — POTASSIUM CHLORIDE CRYS ER 20 MEQ PO TBCR
20.0000 meq | EXTENDED_RELEASE_TABLET | Freq: Once | ORAL | Status: AC
  Administered 2017-09-09: 20 meq via ORAL
  Filled 2017-09-09: qty 1

## 2017-09-09 NOTE — Progress Notes (Signed)
Subjective/Complaints:  Patient has no complaints today other than having had several  therapy sessions already No pain complaints ROS-  Unable to assess due to cognition/participation  Objective: Vital Signs: Blood pressure 131/84, pulse 85, temperature 98 F (36.7 C), temperature source Oral, resp. rate 18, height 5' 3"  (1.6 m), weight 59.9 kg (132 lb), SpO2 100 %. No results found. No results found for this or any previous visit (from the past 72 hour(s)).   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 +NGT Cardio: RRR and no JVD Resp: CTA B/L. Unlabored. GI: BS positive, non tender Skin:   Other RIght upper chest wound from tunneled cath, CDI Neuro: Lethargic, Flat,  Nonverbal.  Motor 4+/5 B delt, bi, tri, grip  RLE: 3-/5 HF, KE Ankle PF, 0 ankle DF Musc/Skel:  No edema. No tenderness. Gen NAD. Vital signs reviewed. Psych: Nonverbal.  Assessment/Plan: 1. Functional deficits secondary to Right periopthalmic SAH with cognitive deficits, Left frontal IPH and gait disorder,RLE >LLE weakness  which require 3+ hours per day of interdisciplinary therapy in a comprehensive inpatient rehab setting. Physiatrist is providing close team supervision and 24 hour management of active medical problems listed below. Physiatrist and rehab team continue to assess barriers to discharge/monitor patient progress toward functional and medical goals. FIM: Function - Bathing Position: Shower Body parts bathed by patient: Right arm, Left arm, Chest, Abdomen, Front perineal area, Buttocks, Right upper leg, Left upper leg, Left lower leg, Right lower leg Body parts bathed by helper: Back Assist Level: Touching or steadying assistance(Pt > 75%)  Function- Upper Body Dressing/Undressing What is the patient wearing?: Pull over shirt/dress Pull over shirt/dress - Perfomed by patient: Thread/unthread right sleeve, Thread/unthread left sleeve, Put head through opening, Pull shirt  over trunk Pull over shirt/dress - Perfomed by helper: Put head through opening, Pull shirt over trunk Assist Level: Supervision or verbal cues Function - Lower Body Dressing/Undressing What is the patient wearing?: Pants, Non-skid slipper socks Position: Wheelchair/chair at sink Pants- Performed by patient: Thread/unthread right pants leg, Thread/unthread left pants leg, Pull pants up/down Pants- Performed by helper: Thread/unthread left pants leg, Pull pants up/down Non-skid slipper socks- Performed by patient: Don/doff right sock, Don/doff left sock Non-skid slipper socks- Performed by helper: Don/doff right sock Shoes - Performed by helper: Don/doff right shoe, Don/doff left shoe, Fasten right, Fasten left Assist for footwear: Supervision/touching assist Assist for lower body dressing: Touching or steadying assistance (Pt > 75%)  Function - Toileting Toileting activity did not occur: No continent bowel/bladder event Toileting steps completed by patient: Performs perineal hygiene Toileting steps completed by helper: Adjust clothing prior to toileting, Performs perineal hygiene, Adjust clothing after toileting Assist level: Touching or steadying assistance (Pt.75%)  Function - Air cabin crew transfer assistive device: Bedside commode Mechanical lift: Stedy Assist level to toilet: Moderate assist (Pt 50 - 74%/lift or lower) Assist level from toilet: Moderate assist (Pt 50 - 74%/lift or lower) Assist level to bedside commode (at bedside): 2 helpers Assist level from bedside commode (at bedside): 2 helpers  Function - Chair/bed transfer Chair/bed transfer method: Squat pivot Chair/bed transfer assist level: Touching or steadying assistance (Pt > 75%) Chair/bed transfer assistive device: Armrests Chair/bed transfer details: Manual facilitation for weight shifting, Verbal cues for safe use of DME/AE  Function - Locomotion: Wheelchair Type: Manual Max wheelchair distance:  58f Assist Level: Touching or steadying assistance (Pt > 75%) Wheel 50 feet with 2 turns activity did not occur: Refused Assist Level: Touching or  steadying assistance (Pt > 75%) Wheel 150 feet activity did not occur: Safety/medical concerns Turns around,maneuvers to table,bed, and toilet,negotiates 3% grade,maneuvers on rugs and over doorsills: No Function - Locomotion: Ambulation Ambulation activity did not occur: Safety/medical concerns Assistive device: Other (comment) (shopping cart) Max distance: 2f Assist level: Moderate assist (Pt 50 - 74%) Walk 10 feet activity did not occur: Safety/medical concerns Assist level: Moderate assist (Pt 50 - 74%) Walk 150 feet activity did not occur: Safety/medical concerns Walk 10 feet on uneven surfaces activity did not occur: Safety/medical concerns  Function - Comprehension Comprehension: Auditory Comprehension assist level: Understands basic 25 - 49% of the time/ requires cueing 50 - 75% of the time  Function - Expression Expression: Verbal Expression assist level: Expresses basic 25 - 49% of the time/requires cueing 50 - 75% of the time. Uses single words/gestures.  Function - Social Interaction Social Interaction assist level: Interacts appropriately 25 - 49% of time - Needs frequent redirection.  Function - Problem Solving Problem solving assist level: Solves basic 25 - 49% of the time - needs direction more than half the time to initiate, plan or complete simple activities  Function - Memory Memory assist level: Recognizes or recalls 25 - 49% of the time/requires cueing 50 - 75% of the time Patient normally able to recall (first 3 days only): That he or she is in a hospital  Medical Problem List and Plan: 1. 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 Formulized individual treatment plan based on medical history, underlying problem and comorbidities. 2. DVT Prophylaxis/Anticoagulation: SCDs 3. Pain Management: Tylenol as needed 4. Mood: Provide emotional support  More alert today, no longer on Atarax 5. Neuropsych: This patient is notcapable of making decisions on herown behalf.  Fall safety precautions  6. Skin/Wound Care: Routine skin checks 7. Fluids/Electrolytes/Nutrition: Routine I&Os 8.Seizure disorder.Keppra 1000 mg twice a day 9.Dysphagia. Dysphagia #2 thin liquids/nasogastric tube feeds to supplement---remove when able to adequately meet nutritional needs--follow daily I's and O's. 10.Hypertension.Labetalol 200 mg BID -DBP elevated particularly.  -check bp's q shift and with therapy.  Controlled 10/24 Vitals:   09/08/17 2117 09/09/17 0549  BP: (!) 146/91 131/84  Pulse: 87 85  Resp:  18  Temp:  98 F (36.7 C)  SpO2: 99% 100%   11.Leukocytosis. Likely UTI      12. ABLA  Hb 8.4 on 10/17  Cont to monitor 13. UTI, Escherichia coli, start Keflex- Sensitive LOS (Days) 8 A FACE TO FACE EVALUATION WAS PERFORMED  Man Effertz E 09/09/2017, 9:44 AM

## 2017-09-09 NOTE — Progress Notes (Signed)
Speech Language Pathology Daily Session Note  Patient Details  Name: Suzanne Brooks MRN: [id number] Date of Birth: May 03, 1976  Today's Date: 09/09/2017 SLP Individual Time: 1025-1038 SLP Individual Time Calculation (min): 13 min  Short Term Goals: Week 1: SLP Short Term Goal 1 (Week 1): Pt will sustain attention to basic task for ~ 15 minutes with Min A cues.  SLP Short Term Goal 2 (Week 1): Pt will initiate basic familiar task within 50% of opportunities and Mod A cues.  SLP Short Term Goal 3 (Week 1): Pt will verbally respond to question in timely manner in 50% of opportunities and Mod A cues.  SLP Short Term Goal 4 (Week 1): Pt will increase vocal intensity to achieve ~ 75% intelligibility at the sentence level with Mod A cues.  SLP Short Term Goal 5 (Week 1): Pt will demonstrate consistent orientation x 4 with Min A cues.  SLP Short Term Goal 6 (Week 1): Pt will consume dysphagia 2 diet with thin liquids and minimal overt s/s of aspiration with supervision cues for use of compensatory swallow strategies.   Skilled Therapeutic Interventions: Skilled ST services focused on cognitive skills. Pt was asleep upon entering room and requiring Max A verbal and tactile cues to maintain arousal. Pt demonstrated basic problem solving, identifying what is wrong in visual cards, with Mod A verbal cues to initiate response and responded within a timely manner 50% in presented opportunities with Mod a verbal cues to redirect. Session was ended earlier due to lack of participation and unable to maintain arousal. Pt was left in bed with call bell within reach. Recommend to continue ST services.     Function:  Eating Eating                 Cognition Comprehension Comprehension assist level: Understands basic 25 - 49% of the time/ requires cueing 50 - 75% of the time  Expression   Expression assist level: Expresses basic 25 - 49% of the time/requires cueing 50 - 75% of the time. Uses single  words/gestures.  Social Interaction Social Interaction assist level: Interacts appropriately 25 - 49% of time - Needs frequent redirection.  Problem Solving Problem solving assist level: Solves basic 25 - 49% of the time - needs direction more than half the time to initiate, plan or complete simple activities  Memory Memory assist level: Recognizes or recalls 25 - 49% of the time/requires cueing 50 - 75% of the time    Pain Pain Assessment Pain Assessment: No/denies pain  Therapy/Group: Individual Therapy  Floride Hutmacher  [facility] 09/09/2017, 10:43 AM

## 2017-09-09 NOTE — Progress Notes (Signed)
Occupational Therapy Session Note  Patient Details  Name: Enrigue CatenaChristie Coddington MRN: [id number] Date of Birth: 03-01-1976  Today's Date: 09/09/2017 OT Group Time: 1100-1200 OT Group Time Calculation (min): 60 min   Skilled Therapeutic Interventions/Progress Updates:    Pt participated in therapeutic activities group to address functional balance, muscular endurance, sit <> stands, selective attention, functional problem solving and transfers in a social setting. Pt requires minA with AFO donned assistance for transfers, functional ambulation and standing balance  during session. Pt participated in simple kitchen task/ activity to address the above impairments. Education provided on safety at home and activity tolerance.    Therapy Documentation Precautions:  Precautions Precautions: Fall Restrictions Weight Bearing Restrictions: No General:   Vital Signs: Therapy Vitals Temp: 98.8 F (37.1 C) Temp Source: Oral Pulse Rate: 80 Resp: 14 BP: (!) 151/85 Patient Position (if appropriate): Lying Oxygen Therapy SpO2: 100 % O2 Device: Not Delivered Pain: Pain Assessment Pain Assessment: No/denies pain  See Function Navigator for Current Functional Status.   Therapy/Group: Group Therapy  Roney MansSmith, [facility] 09/09/2017, 4:18 PM

## 2017-09-09 NOTE — Progress Notes (Signed)
Speech Language Pathology Daily Session Note  Patient Details  Name: Suzanne CatenaChristie Brooks MRN: [id number] Date of Birth: [birth year]  Today's Date: 09/09/2017 SLP Individual Time: 0730-0830 SLP Individual Time Calculation (min): 60 min  Short Term Goals: Week 2: SLP Short Term Goal 1 (Week 2): Pt will sustain attention to basic task for ~ 15 minutes with supervision A cues.  SLP Short Term Goal 2 (Week 2): Pt will initiate basic familiar task within 60% of opportunities and Mod A cues.  SLP Short Term Goal 3 (Week 2): Pt will verbally respond to question in timely manner in 60% of opportunities and Mod A cues.  SLP Short Term Goal 4 (Week 2): Pt will increase vocal intensity to achieve ~ 75% intelligibility at the sentence level with Min A cues.  SLP Short Term Goal 5 (Week 2): Pt will demonstrate consistent orientation x 4 with Min A cues.  SLP Short Term Goal 6 (Week 2): Pt will consume dysphagia 3 diet trials with minimal overt s/s of aspiration with Min A cues for use of compensatory swallow strategies.   Skilled Therapeutic Interventions: Skilled treatment session focused on dysphagia and cognition goals. SLP provided skilled observation of pt consuming dysphagia 2 breakfast tray with thin liquids. Pt required Max A to transfer from bed to chair and despite Total A pt with minimal task initiation for self-feeding. Pt required Max A cues to sustain attention in quiet environment for ~ 30 minutes. Pt was returned to room, transferred back to bed, bed alarm on and all needs within reach. Continue per current plan of care.      Function:  Eating Eating   Modified Consistency Diet: Yes Eating Assist Level: More than reasonable amount of time;Supervision or verbal cues           Cognition Comprehension Comprehension assist level: Understands basic 25 - 49% of the time/ requires cueing 50 - 75% of the time  Expression   Expression assist level: Expresses basic 25 - 49% of the time/requires  cueing 50 - 75% of the time. Uses single words/gestures.  Social Interaction Social Interaction assist level: Interacts appropriately 25 - 49% of time - Needs frequent redirection.  Problem Solving Problem solving assist level: Solves basic 25 - 49% of the time - needs direction more than half the time to initiate, plan or complete simple activities  Memory Memory assist level: Recognizes or recalls 25 - 49% of the time/requires cueing 50 - 75% of the time    Pain Pain Assessment Pain Assessment: No/denies pain  Therapy/Group: Individual Therapy  Briahnna Harries 09/09/2017, 2:10 PM

## 2017-09-09 NOTE — Evaluation (Signed)
Recreational Therapy Assessment and Plan  Patient Details  Name: Suzanne Brooks MRN: [id number] Date of Birth: 06-Aug-1976 Today's Date: 09/09/2017  Rehab Potential:   Good ELOS:    D/c 11/9  Assessment Problem List:      Patient Active Problem List   Diagnosis Date Noted  . Endotracheal tube present   . ICH (intracerebral hemorrhage) ([place]) 07/30/2017  . SAH (subarachnoid hemorrhage) ([place]) 07/30/2017  . Seizure-like activity ([place])   . Acute respiratory failure with hypoxemia [facility])     Past Medical History:      Past Medical History:  Diagnosis Date  . Hypertension   . Seizures ([place])    Past Surgical History:       Past Surgical History:  Procedure Laterality Date  . IR 3D INDEPENDENT WKST  07/30/2017  . IR ANGIO INTRA EXTRACRAN SEL INTERNAL CAROTID BILAT MOD SED  07/30/2017  . IR ANGIO VERTEBRAL SEL VERTEBRAL UNI R MOD SED  07/30/2017  . IR ANGIOGRAM FOLLOW UP STUDY  07/30/2017  . IR ANGIOGRAM FOLLOW UP STUDY  07/30/2017  . IR ANGIOGRAM FOLLOW UP STUDY  07/30/2017  . IR TRANSCATH/EMBOLIZ  07/30/2017  . LAPAROSCOPIC REVISION VENTRICULAR-PERITONEAL (V-P) SHUNT N/A 08/27/2017   Procedure: LAPAROSCOPIC REVISION VENTRICULAR-PERITONEAL (V-P) SHUNT;  Surgeon: Ralene Ok, MD;  Location: [place];  Service: General;  Laterality: N/A;  . RADIOLOGY WITH ANESTHESIA N/A 07/30/2017   Procedure: RADIOLOGY WITH ANESTHESIA/ DR. Bradly Chris;  Surgeon: Radiologist, Medication, MD;  Location: [place];  Service: Radiology;  Laterality: N/A;  . VENTRICULOPERITONEAL SHUNT N/A 08/27/2017   Procedure: SHUNT INSERTION VENTRICULAR-PERITONEAL;  Surgeon: Consuella Lose, MD;  Location: [place];  Service: Neurosurgery;  Laterality: N/A;    Assessment & Plan Clinical Impression: Suzanne Brooks a 41 y.o.right handed femalewith history of hypertension as well as reported seizures.On no prescription medications at time of admission. Per report patient independent prior to admission  multiple family in the area with daughter planning to provide assistance.Presented 07/30/2017 with seizure requiring intubation. Cranial CT scan showed diffuse subarachnoid hemorrhage. 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 A continuous EEG showed generalized polymorphic delta slowing suggesting severe encephalopathy no seizure.Maintained on Keppra for seizure prophylaxis.Patient with persistent leukocytosis as well as fever with infectious disease consulted placed on broad-spectrum antibiotics. CSF cultures negative antibiotics completed. 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 Dysphagia #2 dietandthinliquid dietas well as nasogastric tube feeds for nutritional support due to lack of appetite. Physical and occupationaltherapy evaluationscompleted with recommendations of physical medicine rehabilitation consult.patient was admitted for a comprehensive rehabilitation program  Patient transferred to CIR on 09/01/2017 .    Pt presents with decreased activity tolerance, decreased functional mobility, decreased balance, decreased coordination, decreased attention, decreased awareness, decreased problem solving, decreased safety awareness, decreased memory and delayed processing Limiting pt's independence with leisure/community pursuits.  Plan Min 1 TR session/Group >25 minutes during LOS  Recommendations for other services: Neuropsych  Discharge Criteria: Patient will be discharged from TR if patient refuses treatment 3 consecutive times without medical reason.  If treatment goals not met, if there is a change in medical status, if patient makes no progress towards goals or if patient is discharged from hospital.  The above assessment,  treatment plan, treatment alternatives and goals were discussed and mutually agreed upon: by patient   [place] 09/09/2017, 4:27 PM   Session Note: Pain; No c/o  Pt referred to TR for participation in a kitchen safety/meal prep group.    Group goals:  >Pt will actively participate in 45 minute kitchen task at supervision level seated and min assist standing. > Pt will maintain selective attention to task for 45 minutes with min cues. Pt participated in kitchen safety/meal prep group at overall supervision level seated and min assist for sit-stands/standing. Pt required min cuing throughout session to maintain selective attention.  Education provided on kitchen safety/home modifications.

## 2017-09-09 NOTE — Progress Notes (Signed)
Physical Therapy Weekly Progress Note  Patient Details  Name: Suzanne Brooks MRN: [id number] Date of Birth: 19-Apr-1976  Beginning of progress report period: September 02, 2017 End of progress report period: September 09, 2017  Today's Date: 09/09/2017 PT Individual Time: 1450-1535 PT Individual Time Calculation (min): 45 min   Patient has met 4 of 4 short term goals.  Pt has made significant improvements during past week of therapy. Although she continues to demonstrate some deficits in balance, safety awareness, endurance, and attention she has improved overall in functional mobility. She is able to maintain fair balance with single UE support and perform functional reaching without LOB. She has also benefited from the introduction of an AFO and is now able to ambulate distances of up to 167f with minA. She demonstrates potential for continued gains during her current course of therapy.   Patient continues to demonstrate the following deficits muscle weakness and muscle paralysis, impaired timing and sequencing and abnormal tone and decreased attention and decreased safety awareness and therefore will continue to benefit from skilled PT intervention to increase functional independence with mobility.  Patient progressing toward long term goals..  Continue plan of care.  PT Short Term Goals Week 1:  PT Short Term Goal 1 (Week 1): Pt will transfers with moderate assist from PT  PT Short Term Goal 1 - Progress (Week 1): Met PT Short Term Goal 2 (Week 1): Pt will ambulate 370fwith max assist  PT Short Term Goal 2 - Progress (Week 1): Met PT Short Term Goal 3 (Week 1): Pt will maintain standing balance for 1 minute with moderate assist  PT Short Term Goal 3 - Progress (Week 1): Met PT Short Term Goal 4 (Week 1): Pt will perform bed mobility with moderate assist  PT Short Term Goal 4 - Progress (Week 1): Met Week 2:  PT Short Term Goal 1 (Week 2): Pt will consistant perform all trasnfers with min  assist PT Short Term Goal 2 (Week 2): Pt will perform bed mobility with supervisoin assist  PT Short Term Goal 3 (Week 2): Pt will ambulate [id number] min assist  PT Short Term Goal 4 (Week 2): Pt will propell WC [id number] supervision assist consistantly   Skilled Therapeutic Interventions/Progress Updates: Pt presented in bed agreeable to therapy with mild encouragement. Pt performed supine to sit at EOB with supervision and increased time. Pt expressed urgency for toilet and ambulated to bathroom modA with pt reaching for sink and wall, with PTA providing verbal cues for R foot clearance. Pt able to maintain standing balance while lowering brief and pants (+void). Pt returned to w/c in same manner while PTA donned shoes and AFO. Pt transported to rehab gym and performed gait training with RW [id number] RW and minA Pt required mod cues for safety with RW as pt would consistently drift to L and occasionally hit wall requiring verbal correction. Pt performed standing toe taps to cones with pt touching correct cone 80% of time and able to perform alternating toe taps with RW and min guard and no LOB. Pt ambulated back to room with minA and cues for negotiation with no rest breaks. Pt returned to room in same manner as prior and remained in bed with bed alarm on and current needs met.      Therapy Documentation Precautions:  Precautions Precautions: Fall Restrictions Weight Bearing Restrictions: No    Other Treatments:     See Function Navigator for Current Functional Status.  Therapy/Group:  Individual Therapy  Rosita DeChalus 09/09/2017, 9:02 PM

## 2017-09-09 NOTE — Progress Notes (Signed)
Orthopedic Tech Progress Note Patient Details:  Suzanne CatenaChristie Alden 1975-12-27 [id number]  Patient ID: Suzanne Catenahristie Brooks, female   DOB: 1975-12-27, 41 y.o.   MRN: [id number]   [name], [name] 09/09/2017, 11:15 AM Called in hanger brace order; [facility]

## 2017-09-09 NOTE — Progress Notes (Signed)
Speech Language Pathology Weekly Progress and Session Note  Patient Details  Name: Suzanne Brooks MRN: [id number] Date of Birth: 1976-08-16  Beginning of progress report period: September 02, 2017 End of progress report period: September 09, 2017  Today's Date: 09/09/2017 SLP Individual Time: 6387-5643 SLP Individual Time Calculation (min): 42 min  Short Term Goals: Week 1: SLP Short Term Goal 1 (Week 1): Pt will sustain attention to basic task for ~ 15 minutes with Min A cues.  SLP Short Term Goal 1 - Progress (Week 1): Met SLP Short Term Goal 2 (Week 1): Pt will initiate basic familiar task within 50% of opportunities and Mod A cues.  SLP Short Term Goal 2 - Progress (Week 1): Met SLP Short Term Goal 3 (Week 1): Pt will verbally respond to question in timely manner in 50% of opportunities and Mod A cues.  SLP Short Term Goal 3 - Progress (Week 1): Met SLP Short Term Goal 4 (Week 1): Pt will increase vocal intensity to achieve ~ 75% intelligibility at the sentence level with Mod A cues.  SLP Short Term Goal 4 - Progress (Week 1): Met SLP Short Term Goal 5 (Week 1): Pt will demonstrate consistent orientation x 4 with Min A cues.  SLP Short Term Goal 5 - Progress (Week 1): Not met SLP Short Term Goal 6 (Week 1): Pt will consume dysphagia 2 diet with thin liquids and minimal overt s/s of aspiration with supervision cues for use of compensatory swallow strategies.  SLP Short Term Goal 6 - Progress (Week 1): Met    New Short Term Goals: Week 2: SLP Short Term Goal 1 (Week 2): Pt will sustain attention to basic task for ~ 15 minutes with supervision A cues.  SLP Short Term Goal 2 (Week 2): Pt will initiate basic familiar task within 60% of opportunities and Mod A cues.  SLP Short Term Goal 3 (Week 2): Pt will verbally respond to question in timely manner in 60% of opportunities and Mod A cues.  SLP Short Term Goal 4 (Week 2): Pt will increase vocal intensity to achieve ~ 75% intelligibility at  the sentence level with Min A cues.  SLP Short Term Goal 5 (Week 2): Pt will demonstrate consistent orientation x 4 with Min A cues.  SLP Short Term Goal 6 (Week 2): Pt will consume dysphagia 3 diet trials with minimal overt s/s of aspiration with Min A cues for use of compensatory swallow strategies.   Weekly Progress Updates: Pt demonstrated ability to met 5 out 6 goals, however progression is slow due to continued cognitive deficits. Pt had demonstrated improvements in sustained attention, basic problem solving, response to verbal prompts, speech intelligibility, safe po consumption and task initiation, however will continue to address basic goals with higher percentage of accuracy and/reducation in cueing level required. Pt continues to benefits from skilled ST services in order to maximize functional independence and reduce burden of care.     Intensity: Minumum of 1-2 x/day, 30 to 90 minutes Frequency: 3 to 5 out of 7 days Duration/Length of Stay: 11/9 Treatment/Interventions: Cognitive remediation/compensation;Cueing hierarchy;Functional tasks;Patient/family education;Internal/external aids;Dysphagia/aspiration precaution training;Medication managment;Therapeutic Activities   Daily Session  Skilled Therapeutic Interventions: Skilled ST services focused on swallow and cognitive skills. SLP facilitated task initiation and basic problem solving skills with following 1-2 step body direction movements, pt demonstrated 80% accuracy in ability to follow commands and initiate request task with Mod A visual and verbal cues. SLP educated pt on the importance of increased  PO consumption in order to remove nasal feeding tube, pt stated understanding. Pt demonstrated 80% task initiation during PO consumption of meal tray and no overt s/s aspiration with supervision question cues to clear oral cavity before next bite. Pt was left in room with call bell within reach. Recommend to continue skilled ST  services.     Function:   Eating Eating   Modified Consistency Diet: Yes Eating Assist Level: More than reasonable amount of time;Supervision or verbal cues           Cognition Comprehension Comprehension assist level: Understands basic 25 - 49% of the time/ requires cueing 50 - 75% of the time  Expression   Expression assist level: Expresses basic 25 - 49% of the time/requires cueing 50 - 75% of the time. Uses single words/gestures.  Social Interaction Social Interaction assist level: Interacts appropriately 25 - 49% of time - Needs frequent redirection.  Problem Solving Problem solving assist level: Solves basic 25 - 49% of the time - needs direction more than half the time to initiate, plan or complete simple activities  Memory Memory assist level: Recognizes or recalls 25 - 49% of the time/requires cueing 50 - 75% of the time   General    Pain Pain Assessment Pain Assessment: No/denies pain  Therapy/Group: Individual Therapy  Avaline Stillson  [facility] 09/09/2017, 1:13 PM

## 2017-09-10 ENCOUNTER — Inpatient Hospital Stay (HOSPITAL_COMMUNITY): Payer: Medicaid Other | Admitting: Speech Pathology

## 2017-09-10 ENCOUNTER — Inpatient Hospital Stay (HOSPITAL_COMMUNITY): Payer: Medicaid Other | Admitting: Physical Therapy

## 2017-09-10 ENCOUNTER — Inpatient Hospital Stay (HOSPITAL_COMMUNITY): Payer: Medicaid Other | Admitting: Occupational Therapy

## 2017-09-10 NOTE — Progress Notes (Signed)
Subjective/Complaints: No new problems this morning.   ROS: Limited due to cognitive/behavioral    Objective: Vital Signs: Blood pressure 128/86, pulse 87, temperature 98.4 F (36.9 C), temperature source Oral, resp. rate 18, height 5\' 3"  (1.6 m), weight 60.6 kg (133 lb 8 oz), SpO2 100 %. No results found. No results found for this or any previous visit (from the past 72 hour(s)).   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 +NGT Cardio:RRR without murmur. No JVD  Resp: CTA Bilaterally without wheezes or rales. Normal effort  GI: BS positive, non tender Skin:   Other RIght upper chest wound from tunneled cath, CDI Neuro: fairly alert Nonverbal.  Motor 4+/5 B delt, bi, tri, grip  RLE: 3-/5 HF, KE Ankle PF, 0 ankle DF---unchanged Musc/Skel:  No edema. No tenderness. Gen NAD. Vital signs reviewed. Psych: Nonverbal.  Assessment/Plan: 1. Functional deficits secondary to Right periopthalmic SAH with cognitive deficits, Left frontal IPH and gait disorder,RLE >LLE weakness  which require 3+ hours per day of interdisciplinary therapy in a comprehensive inpatient rehab setting. Physiatrist is providing close team supervision and 24 hour management of active medical problems listed below. Physiatrist and rehab team continue to assess barriers to discharge/monitor patient progress toward functional and medical goals. FIM: Function - Bathing Position: Shower Body parts bathed by patient: Right arm, Left arm, Chest, Abdomen, Front perineal area, Buttocks, Right upper leg, Left upper leg, Left lower leg, Right lower leg Body parts bathed by helper: Back Assist Level: Touching or steadying assistance(Pt > 75%)  Function- Upper Body Dressing/Undressing What is the patient wearing?: Pull over shirt/dress Pull over shirt/dress - Perfomed by patient: Thread/unthread right sleeve, Thread/unthread left sleeve, Put head through opening, Pull shirt over trunk Pull over  shirt/dress - Perfomed by helper: Put head through opening, Pull shirt over trunk Assist Level: Supervision or verbal cues Function - Lower Body Dressing/Undressing What is the patient wearing?: Pants, Non-skid slipper socks Position: Wheelchair/chair at sink Pants- Performed by patient: Thread/unthread right pants leg, Thread/unthread left pants leg, Pull pants up/down Pants- Performed by helper: Thread/unthread left pants leg, Pull pants up/down Non-skid slipper socks- Performed by patient: Don/doff right sock, Don/doff left sock Non-skid slipper socks- Performed by helper: Don/doff right sock Shoes - Performed by helper: Don/doff right shoe, Don/doff left shoe, Fasten right, Fasten left Assist for footwear: Supervision/touching assist Assist for lower body dressing: Touching or steadying assistance (Pt > 75%)  Function - Toileting Toileting activity did not occur: No continent bowel/bladder event Toileting steps completed by patient: Performs perineal hygiene Toileting steps completed by helper: Adjust clothing prior to toileting, Performs perineal hygiene, Adjust clothing after toileting Assist level: Touching or steadying assistance (Pt.75%)  Function - ArchivistToilet Transfers Toilet transfer assistive device: Bedside commode Mechanical lift: Stedy Assist level to toilet: Moderate assist (Pt 50 - 74%/lift or lower) Assist level from toilet: Moderate assist (Pt 50 - 74%/lift or lower) Assist level to bedside commode (at bedside): 2 helpers Assist level from bedside commode (at bedside): 2 helpers  Function - Chair/bed transfer Chair/bed transfer method: Ambulatory Chair/bed transfer assist level: Touching or steadying assistance (Pt > 75%) Chair/bed transfer assistive device: Armrests Chair/bed transfer details: Manual facilitation for weight shifting, Verbal cues for safe use of DME/AE  Function - Locomotion: Wheelchair Type: Manual Max wheelchair distance: 5675ft Assist Level:  Touching or steadying assistance (Pt > 75%) Wheel 50 feet with 2 turns activity did not occur: Refused Assist Level: Touching or steadying assistance (Pt > 75%)  Wheel 150 feet activity did not occur: Safety/medical concerns Turns around,maneuvers to table,bed, and toilet,negotiates 3% grade,maneuvers on rugs and over doorsills: No Function - Locomotion: Ambulation Ambulation activity did not occur: Safety/medical concerns Assistive device: Walker-rolling Max distance: 157ft Assist level: Touching or steadying assistance (Pt > 75%) Walk 10 feet activity did not occur: Safety/medical concerns Assist level: Touching or steadying assistance (Pt > 75%) Assist level: Touching or steadying assistance (Pt > 75%) Walk 150 feet activity did not occur: Safety/medical concerns Walk 10 feet on uneven surfaces activity did not occur: Safety/medical concerns  Function - Comprehension Comprehension: Auditory Comprehension assist level: Understands basic 25 - 49% of the time/ requires cueing 50 - 75% of the time  Function - Expression Expression: Verbal Expression assist level: Expresses basic 25 - 49% of the time/requires cueing 50 - 75% of the time. Uses single words/gestures.  Function - Social Interaction Social Interaction assist level: Interacts appropriately 25 - 49% of time - Needs frequent redirection.  Function - Problem Solving Problem solving assist level: Solves basic 25 - 49% of the time - needs direction more than half the time to initiate, plan or complete simple activities  Function - Memory Memory assist level: Recognizes or recalls 25 - 49% of the time/requires cueing 50 - 75% of the time Patient normally able to recall (first 3 days only): That he or she is in a hospital  Medical Problem List and Plan: 1. 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 DVT Prophylaxis/Anticoagulation: SCDs 3. Pain Management: Tylenol as needed 4. Mood: Provide emotional support  More alert  Off of Atarax 5. Neuropsych: This patient is notcapable of making decisions on herown behalf.  Fall safety precautions  6. Skin/Wound Care: Routine skin checks 7. Fluids/Electrolytes/Nutrition: Routine I&Os 8.Seizure disorder.Keppra 1000 mg twice a day 9.Dysphagia. Dysphagia #2 thin liquids/nasogastric tube feeds to supplement---remove when able to adequately meet nutritional needs--follow daily I's and O's. 10.Hypertension.Labetalol 200 mg BID -DBP elevated particularly.  -check bp's q shift and with therapy.  Controlled 10/24 Vitals:   09/09/17 2145 09/10/17 0413  BP: (!) 139/95 128/86  Pulse: 86 87  Resp:  18  Temp:  98.4 F (36.9 C)  SpO2: 100% 100%   11.Leukocytosis. Likely UTI      12. ABLA  Hb 8.4 on 10/17  Cont to monitor 13. UTI, Escherichia coli, start Keflex- Sensitive  LOS (Days) 9 A FACE TO FACE EVALUATION WAS PERFORMED  Laelah Siravo T 09/10/2017, 8:51 AM

## 2017-09-10 NOTE — Progress Notes (Signed)
Physical Therapy Session Note  Patient Details  Name: Suzanne Brooks MRN: [id number] Date of Birth: December 13, 1975  Today's Date: 09/10/2017 PT Individual Time: 1000-1100 AND 1300-1345 PT Individual Time Calculation (min): 60 min AND 45 min   Short Term Goals: Week 2:  PT Short Term Goal 1 (Week 2): Pt will consistant perform all trasnfers with min assist PT Short Term Goal 2 (Week 2): Pt will perform bed mobility with supervisoin assist  PT Short Term Goal 3 (Week 2): Pt will ambulate 147f with min assist  PT Short Term Goal 4 (Week 2): Pt will propell WC 1543fwith supervision assist consistantly   Skilled Therapeutic Interventions/Progress Updates:    Pt received supine in bed and agreeable to PT. Supine>sit transfer with supervision assist and added time due to lack of motivation.   Stand pivot transfers without AD to R and L x 6 throughout treatment with min assist.   Gait training with RW and RAFO 2x 15021fnd intermittent min-supervision assist.  Pt noted to have difficulty controlling RW around obstacles on the R and the L without cues from PT as well as 2 near falls to the r when turning to the L.   Pt instructed pt in Stair negotiation with BUE support and R AFO. Min assist from PT and moderate cues for proper step-to pattern to improved safety with AFO use.   Gait training also instructed by PT with no UE support x [id number] min assist. Pt attempted to furniture walk multiple times and had one near LOB to the R, mod assist to prevent fall.   WC mobility back to room with BUE propulsion x 100ft55fn cues for obstacle awareness on the R.   Pt returned to room and performed stand pivot  As listed above. Sit>supine completed with supervision assist, and pt left supine in bed with call bell in reach and all needs met.   Session 2.   Pt received supine in bed and agreeable to PT with significant encouragement. Supine>sit transfer with mod assist due to poor participation.    Pt  instructed in sit<>stand to don pants with min assist from PT.   [facility] and min cues for safety and step pattern.   Orthotist present to observe PT. Gait training with RW and no AFO x 50ft 41f min assist and intermittent cues for improved step length and height due to foot drop. Additional gait training with PLS AFO on the R LE x 50 ft and supervision assist using RW.   Pt declines balance training regardless of multiple attempts of PT to educate pt on benefits and need for improved safety at D/C.   Gait training back to room with supervision assist- min assist x 200ft w78fAFO and RW. Min cues for attention to obstacles in hall.   [facility] and all needs met.           Therapy Documentation Precautions:  Precautions Precautions: Fall Restrictions Weight Bearing Restrictions: No Pain: Pain Assessment Pain Assessment: No/denies pain Pain Score: 0-No pain   See Function Navigator for Current Functional Status.   Therapy/Group: Individual Therapy  Shacoya Burkhammer Lorie Phenix2018, 12:14 PM

## 2017-09-10 NOTE — Progress Notes (Signed)
Speech Language Pathology Daily Session Note  Patient Details  Name: Suzanne CatenaChristie Dubas MRN: [id number] Date of Birth: November 17, 1976  Today's Date: 09/10/2017 SLP Individual Time: [id number] SLP Individual Time Calculation (min): 45 min  Short Term Goals: Week 2: SLP Short Term Goal 1 (Week 2): Pt will sustain attention to basic task for ~ 15 minutes with supervision A cues.  SLP Short Term Goal 2 (Week 2): Pt will initiate basic familiar task within 60% of opportunities and Mod A cues.  SLP Short Term Goal 3 (Week 2): Pt will verbally respond to question in timely manner in 60% of opportunities and Mod A cues.  SLP Short Term Goal 4 (Week 2): Pt will increase vocal intensity to achieve ~ 75% intelligibility at the sentence level with Min A cues.  SLP Short Term Goal 5 (Week 2): Pt will demonstrate consistent orientation x 4 with Min A cues.  SLP Short Term Goal 6 (Week 2): Pt will consume dysphagia 3 diet trials with minimal overt s/s of aspiration with Min A cues for use of compensatory swallow strategies.   Skilled Therapeutic Interventions: Skilled treatment session focused on cognition goals. SLP facilitated session by providing Total A for arousal and attempts participate in session. Pt with continual confused statements regarding being up all night etc. Despite Total A cues for orientation, pt unable to demonstrate retention of information. Pt left in bed, bed alarm on and all needs within reach. Continue per current plan of care.      Function:    Cognition Comprehension Comprehension assist level: Understands basic 25 - 49% of the time/ requires cueing 50 - 75% of the time  Expression   Expression assist level: Expresses basic 25 - 49% of the time/requires cueing 50 - 75% of the time. Uses single words/gestures.  Social Interaction Social Interaction assist level: Interacts appropriately 25 - 49% of time - Needs frequent redirection.  Problem Solving Problem solving assist level: Solves  basic 25 - 49% of the time - needs direction more than half the time to initiate, plan or complete simple activities  Memory Memory assist level: Recognizes or recalls 25 - 49% of the time/requires cueing 50 - 75% of the time    Pain    Therapy/Group: Individual Therapy  Bryker Fletchall 09/10/2017, 8:23 AM

## 2017-09-10 NOTE — Progress Notes (Signed)
Occupational Therapy Session Note  Patient Details  Name: Suzanne Brooks MRN: [id number] Date of Birth: April 21, 1976  Today's Date: 09/10/2017 OT Individual Time: 1400-1445 OT Individual Time Calculation (min): 45 min    Short Term Goals: Week 1:  OT Short Term Goal 1 (Week 1): Pt will sit EOB with min A during bathing task OT Short Term Goal 2 (Week 1): Pt will complete sit <> stand at sink with max A +1 in prep for functional task OT Short Term Goal 3 (Week 1): Pt will complete set-up of oral care task with no more than 1 VC for sequencing  Skilled Therapeutic Interventions/Progress Updates:   self care retraining at shower level with focus on functional ambulation around room without AD with min A. Showering sit to stand with occasional min A with ability to stay on task with TV on and continue with tasks (selective attention).  Pt able to dress herself with min A for right footwear.  Ambulated to dayroom to finish ornament task from yesterday and returned to room using ace wrap instead of AFO due to discomfort.   Therapy Documentation Precautions:  Precautions Precautions: Fall Restrictions Weight Bearing Restrictions: No Pain:   mild discomfort in arch of foot from stretched position with AFO with weight bearing- transitioned to ace wrap for incr dorsiflexion instead of AFO Other Treatments:    See Function Navigator for Current Functional Status.   Therapy/Group: Individual Therapy  Roney MansSmith, [name] 09/10/2017, 3:16 PM

## 2017-09-11 ENCOUNTER — Inpatient Hospital Stay (HOSPITAL_COMMUNITY): Payer: Medicaid Other | Admitting: Speech Pathology

## 2017-09-11 ENCOUNTER — Inpatient Hospital Stay (HOSPITAL_COMMUNITY): Payer: Medicaid Other

## 2017-09-11 ENCOUNTER — Inpatient Hospital Stay (HOSPITAL_COMMUNITY): Payer: Medicaid Other | Admitting: Physical Therapy

## 2017-09-11 NOTE — Progress Notes (Signed)
Physical Therapy Session Note  Patient Details  Name: Suzanne Brooks MRN: [id number] Date of Birth: [birth year]  Today's Date: 09/11/2017 PT Individual Time: 1300-1400 PT Individual Time Calculation (min): 60 min   Short Term Goals: Week 2:  PT Short Term Goal 1 (Week 2): Pt will consistant perform all trasnfers with min assist PT Short Term Goal 2 (Week 2): Pt will perform bed mobility with supervisoin assist  PT Short Term Goal 3 (Week 2): Pt will ambulate 169f with min assist  PT Short Term Goal 4 (Week 2): Pt will propell WC 1548fwith supervision assist consistantly   Skilled Therapeutic Interventions/Progress Updates:   Pt received supine in bed and agreeable to PT. Supine>sit transfer with supervision assist. Sit<>stand to don pants with min assist. PT assisted pt to [place] RAFO.   PT transported pt to hospital gift shop gait training in simulated community environment x 1504fith RW. Supervision assist from PT with min cues for step height on the RLE intermittently.   Gait with new PLF R AFO with orthotist present to assess fit. X 35f22fth RW. Supervision assist from PT.    WC mobility with min assist to maintain straight trajectory x 200ft105fStanding balance to complete peg board puzzle, low difficulty and high difficulty with only min cues for error direction; no LOB noted while completing peg board  Pt returned to room and performed stand pivot transfer to bed with min assist from PT. Sit>supine completed with supervision assist,  And pt left supine in bed with call bell in reach and all needs met.         Therapy Documentation Precautions:  Precautions Precautions: Fall Restrictions Weight Bearing Restrictions: No    Vital Signs: Therapy Vitals Temp: 98.8 F (37.1 C) Temp Source: Oral Pulse Rate: 90 Resp: 18 BP: 128/89 Patient Position (if appropriate): Lying Oxygen Therapy SpO2: 100 % O2 Device: Not Delivered Pain: Pain Assessment Pain  Assessment: No/denies pain Pain Score: 0-No pain  See Function Navigator for Current Functional Status.   Therapy/Group: Individual Therapy  Suzanne Phenix6/2018, 7:49 AM

## 2017-09-11 NOTE — Patient Care Conference (Addendum)
Inpatient RehabilitationTeam Conference and Plan of Care Update Date: 09/09/2017   Time: 10:45 AM    Patient Name: Suzanne Brooks      Medical Record Number: [id number]  Date of Birth: 01-16-1976 Sex: Female         Room/Bed: 4W10C/4W10C-01 Payor Info: Payor: MEDICAID  / Plan: MEDICAID [place] ACCESS / Product Type: *No Product type* /    Admitting Diagnosis: sah  Admit Date/Time:  09/01/2017  4:34 PM Admission Comments: No comment available   Primary Diagnosis:  <principal problem not specified> Principal Problem: <principal problem not specified>  Patient Active Problem List   Diagnosis Date Noted  . Acute blood loss anemia   . Benign essential HTN   . Leukocytosis   . Dysphagia   . Endotracheal tube present   . ICH (intracerebral hemorrhage) (HCC) 07/30/2017  . SAH (subarachnoid hemorrhage) (HCC) 07/30/2017  . Seizure-like activity (HCC)   . Acute respiratory failure with hypoxemia [facility])     Expected Discharge Date: Expected Discharge Date: 09/25/17  Team Members Present: Physician leading conference: Dr. Claudette Laws Social Worker Present: [name], LCSW Nurse Present: Kennyth Arnold, RN PT Present: Grier Rocher, PT OT Present: Roney Mans, OT SLP Present: Reuel Derby, SLP PPS Coordinator present : Tora Duck, RN, CRRN     Current Status/Progress Goal Weekly Team Focus  Medical    poor intake, attn, HA  Safe and adequate po reduce fall risk Increase intake trial megace  Bowel/Bladder   LBM 09/06/17 Incontinent episodes of bladder and bowel during day and night.  Min assist   Timed toileting q 2hrs   Swallow/Nutrition/ Hydration   dysphagia 2 with thin liquids  supervision  related to cognitive deficits, task initiation   ADL's   min to mod A functional ambulation/ transfers, sit to stands min A, increased time needed to process, sustained attention to selective attention with mod A, Ub dressing min A, LB dressing with mod A  goals upgraded to  supervision   functional mobility , attention, awareness, functional problem solving, working memory.    Mobility   supervision supine to sit, min guard sit to supine, minA stand pivot, fair static standing balance, gait 45ft with RW minA with trial of AFO, minA w/c mobility  Min assist overall with LRAD for transfers and gait. supervision assist for WC mobility.   standing balance, gait with LRAD, cognitive remediation, endurance, w/c mobillity,    Communication   Min A cues for simple conversation  supervision  use of speech intelligibility strategies for rate   Safety/Cognition/ Behavioral Observations  Max A for task initiation, basic problem solving, sustained attention  Min A  task initiation, basic problem solving   Pain   650 mg Tylenol q 4hr PRN  <3 on a 0-10 pain scale  assess pain q 4 hr and PRN   Skin   Right upper chest incision healed, CDI,   Skin to remain free from infection and breakdown  assess skin q shift and prn    Rehab Goals Patient on target to meet rehab goals: Yes Rehab Goals Revised: Therapists are upgrading many goals to supervision. *See Care Plan and progress notes for long and short-term goals.     Barriers to Discharge  Current Status/Progress Possible Resolutions Date Resolved   Physician     Medical stability; nutrition means; inaccessible home environment     Slow progress  cont Rehab      Nursing  Decreased caregiver support;Incontinence;Medication compliance;Medical stability;Lack of/limited family support;Nutrition  means               PT                    OT                  SLP                SW                Discharge Planning/Teaching Needs:  Pt to d/c to her mother's home at discharge and her dtrs will assist pt, as well.  Mother will be offered family education closer to d/c.   Team Discussion:  Pt is complaining of itching and Dr. Wynn BankerKirsteins is trying to relieve this with medication without sedating her.  He also plans to  decrease tube feeds to see if pt is more hungry. Pt needs a lot of encouragement to eat. Pt has some incontinence, but not always.  Pt still with significant cognitive issues.  Pt is progressing well physically and goals are being upgraded.    Revisions to Treatment Plan:  none      Continued Need for Acute Rehabilitation Level of Care: The patient requires daily medical management by a physician with specialized training in physical medicine and rehabilitation for the following conditions: Daily direction of a multidisciplinary physical rehabilitation program to ensure safe treatment while eliciting the highest outcome that is of practical value to the patient.: Yes Daily medical management of patient stability for increased activity during participation in an intensive rehabilitation regime.: Yes Daily analysis of laboratory values and/or radiology reports with any subsequent need for medication adjustment of medical intervention for : Neurological problems; nutritional problems; post surgical problems   Kveon Casanas, Vista DeckJennifer Capps 09/11/2017, 11:56 PM

## 2017-09-11 NOTE — Progress Notes (Signed)
Subjective/Complaints: Denies pain. Up with therapy  ROS: Limited due to cognitive/behavioral    Objective: Vital Signs: Blood pressure 128/89, pulse 90, temperature 98.8 F (37.1 C), temperature source Oral, resp. rate 18, height 5\' 3"  (1.6 m), weight 60.3 kg (133 lb), SpO2 100 %. No results found. No results found for this or any previous visit (from the past 72 hour(s)).   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 +NGT Cardio: RRR without murmur. No JVD   Resp: CTA Bilaterally without wheezes or rales. Normal effort  GI: BS positive, non tender Skin:   Other RIght upper chest wound from tunneled cath, CDI Neuro: fairly alert Nonverbal.  Motor 4+/5 B delt, bi, tri, grip  RLE: 3-/5 HF, KE Ankle PF, 0 ankle DF---unchanged Musc/Skel:  No edema. No tenderness. Gen NAD. Vital signs reviewed. Psych: Nonverbal.  Assessment/Plan: 1. Functional deficits secondary to Right periopthalmic SAH with cognitive deficits, Left frontal IPH and gait disorder,RLE >LLE weakness  which require 3+ hours per day of interdisciplinary therapy in a comprehensive inpatient rehab setting. Physiatrist is providing close team supervision and 24 hour management of active medical problems listed below. Physiatrist and rehab team continue to assess barriers to discharge/monitor patient progress toward functional and medical goals. FIM: Function - Bathing Position: Shower Body parts bathed by patient: Right arm, Left arm, Chest, Abdomen, Front perineal area, Buttocks, Right upper leg, Left upper leg, Left lower leg, Right lower leg Body parts bathed by helper: Back Assist Level: Touching or steadying assistance(Pt > 75%)  Function- Upper Body Dressing/Undressing What is the patient wearing?: Pull over shirt/dress Pull over shirt/dress - Perfomed by patient: Thread/unthread right sleeve, Thread/unthread left sleeve, Put head through opening, Pull shirt over trunk Pull over  shirt/dress - Perfomed by helper: Put head through opening, Pull shirt over trunk Assist Level: Supervision or verbal cues Function - Lower Body Dressing/Undressing What is the patient wearing?: Pants, Non-skid slipper socks Position: Wheelchair/chair at sink Pants- Performed by patient: Thread/unthread right pants leg, Thread/unthread left pants leg, Pull pants up/down Pants- Performed by helper: Thread/unthread left pants leg, Pull pants up/down Non-skid slipper socks- Performed by patient: Don/doff right sock, Don/doff left sock Non-skid slipper socks- Performed by helper: Don/doff right sock Shoes - Performed by helper: Don/doff right shoe, Don/doff left shoe, Fasten right, Fasten left Assist for footwear: Supervision/touching assist Assist for lower body dressing: Touching or steadying assistance (Pt > 75%)  Function - Toileting Toileting activity did not occur: No continent bowel/bladder event Toileting steps completed by patient: Adjust clothing prior to toileting, Performs perineal hygiene, Adjust clothing after toileting (patient has on hospital gown) Toileting steps completed by helper: Adjust clothing prior to toileting, Performs perineal hygiene, Adjust clothing after toileting Assist level: Touching or steadying assistance (Pt.75%)  Function - Archivist transfer assistive device: Bedside commode Mechanical lift: Stedy Assist level to toilet: Touching or steadying assistance (Pt > 75%) Assist level from toilet: Touching or steadying assistance (Pt > 75%) Assist level to bedside commode (at bedside): Touching or steadying assistance (Pt > 75%) Assist level from bedside commode (at bedside): Touching or steadying assistance (Pt > 75%)  Function - Chair/bed transfer Chair/bed transfer method: Stand pivot Chair/bed transfer assist level: Touching or steadying assistance (Pt > 75%) Chair/bed transfer assistive device: Armrests Chair/bed transfer details: Manual  facilitation for weight shifting, Verbal cues for safe use of DME/AE  Function - Locomotion: Wheelchair Type: Manual Max wheelchair distance: 52ft Assist Level: Touching or steadying assistance (Pt >  75%) Wheel 50 feet with 2 turns activity did not occur: Refused Assist Level: Touching or steadying assistance (Pt > 75%) Wheel 150 feet activity did not occur: Safety/medical concerns Turns around,maneuvers to table,bed, and toilet,negotiates 3% grade,maneuvers on rugs and over doorsills: No Function - Locomotion: Ambulation Ambulation activity did not occur: Safety/medical concerns Assistive device: Walker-rolling Max distance: 26200ft  Assist level: Touching or steadying assistance (Pt > 75%) Walk 10 feet activity did not occur: Safety/medical concerns Assist level: Supervision or verbal cues Assist level: Touching or steadying assistance (Pt > 75%) Walk 150 feet activity did not occur: Safety/medical concerns Assist level: Touching or steadying assistance (Pt > 75%) Walk 10 feet on uneven surfaces activity did not occur: Safety/medical concerns  Function - Comprehension Comprehension: Auditory Comprehension assist level: Understands basic 25 - 49% of the time/ requires cueing 50 - 75% of the time  Function - Expression Expression: Verbal Expression assist level: Expresses basic 25 - 49% of the time/requires cueing 50 - 75% of the time. Uses single words/gestures.  Function - Social Interaction Social Interaction assist level: Interacts appropriately 25 - 49% of time - Needs frequent redirection.  Function - Problem Solving Problem solving assist level: Solves basic 25 - 49% of the time - needs direction more than half the time to initiate, plan or complete simple activities  Function - Memory Memory assist level: Recognizes or recalls 25 - 49% of the time/requires cueing 50 - 75% of the time Patient normally able to recall (first 3 days only): That he or she is in a  hospital  Medical Problem List and Plan: 1. 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 DVT Prophylaxis/Anticoagulation: SCDs 3. Pain Management: Tylenol as needed 4. Mood: Provide emotional support  More alert    5. Neuropsych: This patient is notcapable of making decisions on herown behalf.  Fall safety precautions  6. Skin/Wound Care: Routine skin checks 7. Fluids/Electrolytes/Nutrition: Routine I&Os 8.Seizure disorder.Keppra 1000 mg twice a day 9.Dysphagia. Dysphagia #2 thin liquids/nasogastric tube feeds to supplement---remove when able to adequately meet nutritional needs--follow daily I's and O's. 10.Hypertension.Labetalol 200 mg BID -DBP elevated particularly.  -check bp's q shift and with therapy.  Controlled 10/26 Vitals:   09/10/17 2018 09/11/17 0427  BP: (!) 142/89 128/89  Pulse: 95 90  Resp:  18  Temp:  98.8 F (37.1 C)  SpO2:  100%   11.Leukocytosis.   UTI       12. ABLA  Hb 8.4 on 10/17  Cont to monitor 13. UTI, Escherichia coli,   Keflex-7 day course    LOS (Days) 10 A FACE TO FACE EVALUATION WAS PERFORMED  SWARTZ,ZACHARY T 09/11/2017, 9:22 AM

## 2017-09-11 NOTE — Progress Notes (Signed)
Speech Language Pathology Daily Session Note  Patient Details  Name: Suzanne CatenaChristie Brooks MRN: [id number] Date of Birth: 1975-11-20  Today's Date: 09/11/2017 SLP Individual Time: 0815-0900 SLP Individual Time Calculation (min): 45 min  Short Term Goals: Week 2: SLP Short Term Goal 1 (Week 2): Pt will sustain attention to basic task for ~ 15 minutes with supervision A cues.  SLP Short Term Goal 2 (Week 2): Pt will initiate basic familiar task within 60% of opportunities and Mod A cues.  SLP Short Term Goal 3 (Week 2): Pt will verbally respond to question in timely manner in 60% of opportunities and Mod A cues.  SLP Short Term Goal 4 (Week 2): Pt will increase vocal intensity to achieve ~ 75% intelligibility at the sentence level with Min A cues.  SLP Short Term Goal 5 (Week 2): Pt will demonstrate consistent orientation x 4 with Min A cues.  SLP Short Term Goal 6 (Week 2): Pt will consume dysphagia 3 diet trials with minimal overt s/s of aspiration with Min A cues for use of compensatory swallow strategies.   Skilled Therapeutic Interventions: Skilled treatment session focused on dysphagia and cognition goals. SLP facilitated session by providing skilled observation of pt consuming trial items of dysphagia 3 with dysphagia 2 breakfast tray with thin liquids. Pt with better initiation of self feeding and fed herself 50% of meal and 12 oz thin liquids. Pt responded well to decreased verbal cues to initiate task and use of music to promote continuation of task. Pt continues with variable ability to sustain attention to task d/t "tiredness." Pt was returned to room, left in bed with bed alarm on and all needs within reach. Continue per current plan of care.       Function:  Eating Eating   Modified Consistency Diet: Yes Eating Assist Level: More than reasonable amount of time;Supervision or verbal cues           Cognition Comprehension Comprehension assist level: Understands basic 25 - 49% of  the time/ requires cueing 50 - 75% of the time  Expression   Expression assist level: Expresses basic 25 - 49% of the time/requires cueing 50 - 75% of the time. Uses single words/gestures.  Social Interaction Social Interaction assist level: Interacts appropriately 25 - 49% of time - Needs frequent redirection.  Problem Solving Problem solving assist level: Solves basic 25 - 49% of the time - needs direction more than half the time to initiate, plan or complete simple activities  Memory Memory assist level: Recognizes or recalls 25 - 49% of the time/requires cueing 50 - 75% of the time    Pain Pain Assessment Pain Assessment: No/denies pain Pain Score: 0-No pain Faces Pain Scale: No hurt  Therapy/Group: Individual Therapy  Danica Camarena 09/11/2017, 9:42 AM

## 2017-09-11 NOTE — Progress Notes (Signed)
Occupational Therapy Weekly Progress Note  Patient Details  Name: Suzanne Brooks MRN: [id number] Date of Birth: 09/04/1976  Beginning of progress report period: September 02, 2017 End of progress report period: September 11, 2017  Today's Date: 09/11/2017 OT Individual Time: 1000-1057 OT Individual Time Calculation (min): 57 min    Patient has met 3 of 3 short term goals.  Pt made excellent progress this week in OT. Pt now requires MIN A for basic self care, transfers and ambulation improving from MAX-total A required on initial evaluation. Sitting balance has improved to stand by assistance and standing balance has improved to MIN A-CGA while completing bimanual tasks. Pt able to maintain sustained attention to complete tasks with min VC for sequencing and termination. Pt is able to incorporate RUE during ADLs and [facility] activities without VC. Pt continues to be distractible by internal and external stimuli, decreased safety awareness, and problem solving impacting ability to efficiently carry out ADLs.  Patient continues to demonstrate the following deficits: muscle weakness, decreased cardiorespiratoy endurance, decreased coordination, decreased attention to right, decreased initiation, decreased attention, decreased awareness, decreased problem solving, decreased safety awareness and delayed processing and decreased standing balance and decreased balance strategies and therefore will continue to benefit from skilled OT intervention to enhance overall performance with BADL.  Patient progressing toward long term goals..  Continue plan of care.  OT Short Term Goals Week 1:  OT Short Term Goal 1 (Week 1): Pt will sit EOB with min A during bathing task OT Short Term Goal 1 - Progress (Week 1): Met OT Short Term Goal 2 (Week 1): Pt will complete sit <> stand at sink with max A +1 in prep for functional task OT Short Term Goal 2 - Progress (Week 1): Met OT Short Term Goal 3 (Week 1): Pt will complete  set-up of oral care task with no more than 1 VC for sequencing OT Short Term Goal 3 - Progress (Week 1): Met Week 2:  OT Short Term Goal 1 (Week 2): Pt will terminate bathing tasks with no more than 1 VC OT Short Term Goal 2 (Week 2): Pt will don B shoes with supervision OT Short Term Goal 3 (Week 2): Pt will maintain selective attention for LB dressing with no more than 1 VC OT Short Term Goal 4 (Week 2): Pt will transfer to tub/shower with touching A  Skilled Therapeutic Interventions/Progress Updates:    1:1. Pt engages in bathing and dressing at seated level in shower. Pt ambulates throughout session with RW and MIN A and VC for RW management for transfers onto various surfaces. Pt washes body parts 2x with min VC for terminating bathing and pt leans laterally to wash buttocks. Pt declines donning regular clothes and selects hospital gown from dresser to don. Pt does snaps on gown to improve Leary. Pt dons underwear, socks and shoes with A to don R shoe. Pt able to fasten B shoes. TV on during session for selective attention. Pt able to attend to task ~5 seconds before looking back at tv/tv reflection in mirror. Pt able to redirect self back to task with min VC. Exited session with pt seated in w/c with QRB donned, call light in reach and all needs met.   Session 2: Pt dons L shoe seated EOB however requires A to don R shoe/AFO as pt unable to problems solve/respond to cueing when TV on 2/2 selective attention deficits. Pt ambulates throughout session with CGA with VC for RW management/reaching back prior  to sitting and not pulling on walker for sit>stand. Pt transfers onto toilet with CGA to void bowel with increased time. Pt able to complete hygiene seated with supervision. Pt ambualtes to dayroom in same manner stated above to complete [facility] activity of matching push pin to colored circles on coarkboard on high low table with no VC. Exited session with pt seated in bed with bed alarm activated, 4 rails  up per pt request and call light in reach Therapy Documentation Precautions:  Precautions Precautions: Fall Restrictions Weight Bearing Restrictions: No General:    See Function Navigator for Current Functional Status.   Therapy/Group: Individual Therapy  Tonny Branch 09/11/2017, 6:45 AM

## 2017-09-12 ENCOUNTER — Inpatient Hospital Stay (HOSPITAL_COMMUNITY): Payer: Medicaid Other

## 2017-09-12 MED ORDER — LABETALOL HCL 300 MG PO TABS
300.0000 mg | ORAL_TABLET | Freq: Two times a day (BID) | ORAL | Status: DC
  Administered 2017-09-12 – 2017-09-22 (×20): 300 mg via ORAL
  Filled 2017-09-12 (×21): qty 1

## 2017-09-12 NOTE — Progress Notes (Signed)
Occupational Therapy Session Note  Patient Details  Name: Suzanne CatenaChristie Siddall MRN: [id number] Date of Birth: 06-08-1976  Today's Date: 09/12/2017 OT Individual Time: [id number] OT Individual Time Calculation (min): 42 min    Short Term Goals: Week 2:  OT Short Term Goal 1 (Week 2): Pt will terminate bathing tasks with no more than 1 VC OT Short Term Goal 2 (Week 2): Pt will don B shoes with supervision OT Short Term Goal 3 (Week 2): Pt will maintain selective attention for LB dressing with no more than 1 VC OT Short Term Goal 4 (Week 2): Pt will transfer to tub/shower with touching A  Skilled Therapeutic Interventions/Progress Updates:    1;1. Pt requesting to bathe this session. Pt ambulates with RW with CGA and VC for foot clearance to bathroom to transfer onto TTB with Vc for keeping walker closer while turning. Pt bathes with question cues for pt to terminate bathing. Pt states, "ive always taken long showers." Pt dons hospital gown, underwear, socks and L shoe with CGA for clothing management. Pt able to bring RLE up to/maintain figure four wihtout touching A. OT dons R shoe and AFO. Pt ambualtes in hallway greeting familiar therapist/staff on R side without VC. Exited session with pt seated in bed with call lgiht in reach and bed exit alarm set.  Therapy Documentation Precautions:  Precautions Precautions: Fall Restrictions Weight Bearing Restrictions: No  See Function Navigator for Current Functional Status.   Therapy/Group: Individual Therapy  Shon HaleStephanie M Ethelene Closser 09/12/2017, 2:14 PM

## 2017-09-12 NOTE — Progress Notes (Signed)
Subjective/Complaints: Lying in bed. No new issues. Remain impulsive.   ROS: pt denies nausea, vomiting, diarrhea, cough, shortness of breath or chest pain   Objective: Vital Signs: Blood pressure (!) 144/100, pulse 88, temperature 98.7 F (37.1 C), temperature source Oral, resp. rate 18, height 5\' 3"  (1.6 m), weight 60.6 kg (133 lb 9.6 oz), SpO2 98 %. No results found. No results found for this or any previous visit (from the past 72 hour(s)).   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 +NGT Cardio: RRR without murmur. No JVD  Resp: CTA Bilaterally without wheezes or rales. Normal effort  GI: BS positive, non tender Skin:   Other RIght upper chest wound from tunneled cath, CDI Neuro: fairly alert Nonverbal.  Motor 4+/5 B delt, bi, tri, grip  RLE: 3-/5 HF, KE Ankle PF, 0 ankle DF---unchanged Musc/Skel:  No edema. No tenderness. Gen NAD. Vital signs reviewed. Psych: Nonverbal.  Assessment/Plan: 1. Functional deficits secondary to Right periopthalmic SAH with cognitive deficits, Left frontal IPH and gait disorder,RLE >LLE weakness  which require 3+ hours per day of interdisciplinary therapy in a comprehensive inpatient rehab setting. Physiatrist is providing close team supervision and 24 hour management of active medical problems listed below. Physiatrist and rehab team continue to assess barriers to discharge/monitor patient progress toward functional and medical goals. FIM: Function - Bathing Position: Shower Body parts bathed by patient: Right arm, Left arm, Chest, Abdomen, Front perineal area, Buttocks, Right upper leg, Left upper leg, Left lower leg, Right lower leg Body parts bathed by helper: Back Assist Level: Touching or steadying assistance(Pt > 75%)  Function- Upper Body Dressing/Undressing What is the patient wearing?: Pull over shirt/dress Pull over shirt/dress - Perfomed by patient: Thread/unthread right sleeve, Thread/unthread left  sleeve, Put head through opening, Pull shirt over trunk Pull over shirt/dress - Perfomed by helper: Put head through opening, Pull shirt over trunk Assist Level: Supervision or verbal cues Function - Lower Body Dressing/Undressing What is the patient wearing?: Pants, Non-skid slipper socks Position: Wheelchair/chair at sink Pants- Performed by patient: Thread/unthread right pants leg, Thread/unthread left pants leg, Pull pants up/down Pants- Performed by helper: Thread/unthread left pants leg, Pull pants up/down Non-skid slipper socks- Performed by patient: Don/doff right sock, Don/doff left sock Non-skid slipper socks- Performed by helper: Don/doff right sock Shoes - Performed by helper: Don/doff right shoe, Don/doff left shoe, Fasten right, Fasten left Assist for footwear: Supervision/touching assist Assist for lower body dressing: Touching or steadying assistance (Pt > 75%)  Function - Toileting Toileting activity did not occur: Safety/medical concerns Toileting steps completed by patient: Adjust clothing prior to toileting, Performs perineal hygiene, Adjust clothing after toileting Toileting steps completed by helper: Adjust clothing prior to toileting, Performs perineal hygiene, Adjust clothing after toileting Assist level: Touching or steadying assistance (Pt.75%)  Function - ArchivistToilet Transfers Toilet transfer activity did not occur: Safety/medical concerns Toilet transfer assistive device: Bedside commode Mechanical lift: Stedy Assist level to toilet: Touching or steadying assistance (Pt > 75%) Assist level from toilet: Touching or steadying assistance (Pt > 75%) Assist level to bedside commode (at bedside): Touching or steadying assistance (Pt > 75%) Assist level from bedside commode (at bedside): Touching or steadying assistance (Pt > 75%)  Function - Chair/bed transfer Chair/bed transfer method: Stand pivot Chair/bed transfer assist level: Touching or steadying assistance (Pt >  75%) Chair/bed transfer assistive device: Armrests Chair/bed transfer details: Verbal cues for safe use of DME/AE  Function - Locomotion: Wheelchair Type: Manual Max wheelchair  distance: 58ft Assist Level: Touching or steadying assistance (Pt > 75%) Wheel 50 feet with 2 turns activity did not occur: Refused Assist Level: Touching or steadying assistance (Pt > 75%) Wheel 150 feet activity did not occur: Safety/medical concerns Turns around,maneuvers to table,bed, and toilet,negotiates 3% grade,maneuvers on rugs and over doorsills: No Function - Locomotion: Ambulation Ambulation activity did not occur: Safety/medical concerns Assistive device: Walker-rolling Max distance: 213ft  Assist level: Touching or steadying assistance (Pt > 75%) Walk 10 feet activity did not occur: Safety/medical concerns Assist level: Supervision or verbal cues Assist level: Touching or steadying assistance (Pt > 75%) Walk 150 feet activity did not occur: Safety/medical concerns Assist level: Touching or steadying assistance (Pt > 75%) Walk 10 feet on uneven surfaces activity did not occur: Safety/medical concerns  Function - Comprehension Comprehension: Auditory Comprehension assist level: Understands basic 25 - 49% of the time/ requires cueing 50 - 75% of the time  Function - Expression Expression: Verbal Expression assist level: Expresses basic 25 - 49% of the time/requires cueing 50 - 75% of the time. Uses single words/gestures.  Function - Social Interaction Social Interaction assist level: Interacts appropriately 25 - 49% of time - Needs frequent redirection.  Function - Problem Solving Problem solving assist level: Solves basic 25 - 49% of the time - needs direction more than half the time to initiate, plan or complete simple activities  Function - Memory Memory assist level: Recognizes or recalls 25 - 49% of the time/requires cueing 50 - 75% of the time Patient normally able to recall (first 3  days only): That he or she is in a hospital  Medical Problem List and Plan: 1. 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 DVT Prophylaxis/Anticoagulation: SCDs 3. Pain Management: Tylenol as needed 4. Mood: Provide emotional support  More alert    5. Neuropsych: This patient is notcapable of making decisions on herown behalf.  Fall safety precautions---still with poor safety awareness  6. Skin/Wound Care: Routine skin checks 7. Fluids/Electrolytes/Nutrition: Routine I&Os 8.Seizure disorder.Keppra 1000 mg twice a day 9.Dysphagia. Dysphagia #2 thin liquids/nasogastric tube feeds to supplement---remove when able to adequately meet nutritional needs--follow daily I's and O's. 10.Hypertension.Labetalol 200 mg BID -DBP elevated particularly. ---increase labetalol to 300mg   -check bp's q shift and with therapy.    Vitals:   09/11/17 2109 09/12/17 0620  BP: (!) 145/106 (!) 144/100  Pulse: 99 88  Resp:  18  Temp:  98.7 F (37.1 C)  SpO2:  98%   11.Leukocytosis.   UTI       12. ABLA  Hb 8.4 on 10/17  Cont to monitor 13. UTI, Escherichia coli,   Keflex-7 day course    LOS (Days) 11 A FACE TO FACE EVALUATION WAS PERFORMED  SWARTZ,ZACHARY T 09/12/2017, 9:11 AM

## 2017-09-12 NOTE — Plan of Care (Signed)
Problem: RH COGNITION-NURSING Goal: RH STG ANTICIPATES NEEDS/CALLS FOR ASSIST W/ASSIST/CUES STG Anticipates Needs/Calls for Assist With mod Assistance/Cues.  Outcome: Not Progressing Pt impulsive, forgets to use call bell for assistance before getting out of bed.

## 2017-09-12 NOTE — Progress Notes (Signed)
Social Work Patient ID: Suzanne Brooks, female   DOB: Dec 20, 1975, 41 y.o.   MRN: [id number]  CSW met with pt to update her on team conference discussion on 09-10-17 and she gave CSW permission to call her mother since her dtr thought it would be best for pt to go to mother's home.  Called Ms. Hopkins, pt's mother, and she is willing to have pt at her home and stated that she can provide 24/7 supervision and has an extra room for pt.  She will prepare room for pt, but it is still up 13 stairs.  CSW will continue to update pt's mother and will encourage her to come for family education closer to d/c.  CSW remains available to assist as needed.

## 2017-09-13 ENCOUNTER — Inpatient Hospital Stay (HOSPITAL_COMMUNITY): Payer: Medicaid Other

## 2017-09-13 NOTE — Progress Notes (Signed)
Subjective/Complaints: Uneventful evening. No new problems noted by RN ROS: pt denies nausea, vomiting, diarrhea, cough, shortness of breath or chest pain   Objective: Vital Signs: Blood pressure (!) 139/99, pulse 86, temperature 97.9 F (36.6 C), temperature source Oral, resp. rate 18, height 5\' 3"  (1.6 m), weight 61 kg (134 lb 7.7 oz), SpO2 100 %. No results found. No results found for this or any previous visit (from the past 72 hour(s)).   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 +NGT Cardio: RRR without murmur. No JVD   Resp: CTA Bilaterally without wheezes or rales. Normal effort   GI: BS positive, non tender Skin:   Other RIght upper chest wound from tunneled cath, CDI Neuro: fairly alert, delayed processing Nonverbal.  Motor 4+/5 B delt, bi, tri, grip  RLE: 3-/5 HF, KE Ankle PF, 0 ankle DF---unchanged Musc/Skel:  No edema. No tenderness. Gen NAD. Vital signs reviewed. Psych: Nonverbal.  Assessment/Plan: 1. Functional deficits secondary to Right periopthalmic SAH with cognitive deficits, Left frontal IPH and gait disorder,RLE >LLE weakness  which require 3+ hours per day of interdisciplinary therapy in a comprehensive inpatient rehab setting. Physiatrist is providing close team supervision and 24 hour management of active medical problems listed below. Physiatrist and rehab team continue to assess barriers to discharge/monitor patient progress toward functional and medical goals. FIM: Function - Bathing Position: Shower Body parts bathed by patient: Right arm, Left arm, Chest, Abdomen, Front perineal area, Buttocks, Right upper leg, Left upper leg, Left lower leg, Right lower leg Body parts bathed by helper: Back Assist Level: Touching or steadying assistance(Pt > 75%)  Function- Upper Body Dressing/Undressing What is the patient wearing?: Pull over shirt/dress Pull over shirt/dress - Perfomed by patient: Thread/unthread right sleeve,  Thread/unthread left sleeve, Put head through opening, Pull shirt over trunk Pull over shirt/dress - Perfomed by helper: Put head through opening, Pull shirt over trunk Assist Level: Supervision or verbal cues Function - Lower Body Dressing/Undressing What is the patient wearing?: Pants, Non-skid slipper socks Position: Wheelchair/chair at sink Pants- Performed by patient: Thread/unthread right pants leg, Thread/unthread left pants leg, Pull pants up/down Pants- Performed by helper: Thread/unthread left pants leg, Pull pants up/down Non-skid slipper socks- Performed by patient: Don/doff right sock, Don/doff left sock Non-skid slipper socks- Performed by helper: Don/doff right sock Shoes - Performed by helper: Don/doff right shoe, Don/doff left shoe, Fasten right, Fasten left Assist for footwear: Supervision/touching assist Assist for lower body dressing: Touching or steadying assistance (Pt > 75%)  Function - Toileting Toileting activity did not occur: Safety/medical concerns Toileting steps completed by patient: Adjust clothing prior to toileting, Performs perineal hygiene, Adjust clothing after toileting Toileting steps completed by helper: Adjust clothing prior to toileting, Performs perineal hygiene, Adjust clothing after toileting Toileting Assistive Devices: Grab bar or rail Assist level: Touching or steadying assistance (Pt.75%)  Function - Archivist transfer activity did not occur: Safety/medical concerns Toilet transfer assistive device: Bedside commode Mechanical lift: Stedy Assist level to toilet: Touching or steadying assistance (Pt > 75%) Assist level from toilet: Touching or steadying assistance (Pt > 75%) Assist level to bedside commode (at bedside): Touching or steadying assistance (Pt > 75%) Assist level from bedside commode (at bedside): Touching or steadying assistance (Pt > 75%)  Function - Chair/bed transfer Chair/bed transfer method: Stand  pivot Chair/bed transfer assist level: Touching or steadying assistance (Pt > 75%) Chair/bed transfer assistive device: Armrests Chair/bed transfer details: Verbal cues for safe use of DME/AE  Function - Locomotion: Wheelchair Type: Manual Max wheelchair distance: 5375ft Assist Level: Touching or steadying assistance (Pt > 75%) Wheel 50 feet with 2 turns activity did not occur: Refused Assist Level: Touching or steadying assistance (Pt > 75%) Wheel 150 feet activity did not occur: Safety/medical concerns Turns around,maneuvers to table,bed, and toilet,negotiates 3% grade,maneuvers on rugs and over doorsills: No Function - Locomotion: Ambulation Ambulation activity did not occur: Safety/medical concerns Assistive device: Walker-rolling Max distance: 28900ft  Assist level: Touching or steadying assistance (Pt > 75%) Walk 10 feet activity did not occur: Safety/medical concerns Assist level: Supervision or verbal cues Assist level: Touching or steadying assistance (Pt > 75%) Walk 150 feet activity did not occur: Safety/medical concerns Assist level: Touching or steadying assistance (Pt > 75%) Walk 10 feet on uneven surfaces activity did not occur: Safety/medical concerns  Function - Comprehension Comprehension: Auditory Comprehension assist level: Understands basic 25 - 49% of the time/ requires cueing 50 - 75% of the time  Function - Expression Expression: Verbal Expression assist level: Expresses basic 25 - 49% of the time/requires cueing 50 - 75% of the time. Uses single words/gestures.  Function - Social Interaction Social Interaction assist level: Interacts appropriately 25 - 49% of time - Needs frequent redirection.  Function - Problem Solving Problem solving assist level: Solves basic 25 - 49% of the time - needs direction more than half the time to initiate, plan or complete simple activities  Function - Memory Memory assist level: Recognizes or recalls 25 - 49% of the  time/requires cueing 50 - 75% of the time Patient normally able to recall (first 3 days only): That he or she is in a hospital  Medical Problem List and Plan: 1. Decreased functional mobility and cognitive deficits secondary 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 DVT Prophylaxis/Anticoagulation: SCDs 3. Pain Management: Tylenol as needed 4. Mood: Provide emotional support  More alert overall    5. Neuropsych: This patient is notcapable of making decisions on herown behalf.  Fall safety precautions---still with poor safety awareness  6. Skin/Wound Care: Routine skin checks 7. Fluids/Electrolytes/Nutrition: Routine I&Os 8.Seizure disorder.Keppra 1000 mg twice a day 9.Dysphagia. Dysphagia #2 thin liquids/nasogastric tube feeds to supplement---remove when able to adequately meet nutritional needs--follow daily I's and O's. 10.Hypertension.  -DBP elevated particularly. ---increased labetalol to 300mg  BID on 10/27 -check bp's q shift and with therapy.   -may need further titration, additional agent   Vitals:   09/12/17 1950 09/13/17 0439  BP: (!) 163/108 (!) 139/99  Pulse: 81 86  Resp:  18  Temp:  97.9 F (36.6 C)  SpO2:  100%   11.Leukocytosis.   UTI       12. ABLA  Hb 8.4 on 10/17  Cont to monitor 13. UTI, Escherichia coli,   Keflex-7 day course    LOS (Days) 12 A FACE TO FACE EVALUATION WAS PERFORMED  Aquanetta Schwarz T 09/13/2017, 9:30 AM

## 2017-09-14 ENCOUNTER — Inpatient Hospital Stay (HOSPITAL_COMMUNITY): Payer: Medicaid Other | Admitting: Physical Therapy

## 2017-09-14 ENCOUNTER — Inpatient Hospital Stay (HOSPITAL_COMMUNITY): Payer: Medicaid Other | Admitting: Occupational Therapy

## 2017-09-14 ENCOUNTER — Inpatient Hospital Stay (HOSPITAL_COMMUNITY): Payer: Medicaid Other | Admitting: Speech Pathology

## 2017-09-14 DIAGNOSIS — I611 Nontraumatic intracerebral hemorrhage in hemisphere, cortical: Secondary | ICD-10-CM

## 2017-09-14 MED ORDER — VITAL 1.5 CAL PO LIQD
1000.0000 mL | ORAL | Status: DC
  Administered 2017-09-14: 1000 mL
  Filled 2017-09-14 (×3): qty 1000

## 2017-09-14 MED ORDER — AMLODIPINE BESYLATE 2.5 MG PO TABS
2.5000 mg | ORAL_TABLET | Freq: Every day | ORAL | Status: DC
  Administered 2017-09-14 – 2017-09-18 (×5): 2.5 mg via ORAL
  Filled 2017-09-14 (×5): qty 1

## 2017-09-14 MED ORDER — MEGESTROL ACETATE 400 MG/10ML PO SUSP
200.0000 mg | Freq: Two times a day (BID) | ORAL | Status: DC
  Administered 2017-09-14 (×2): 200 mg
  Administered 2017-09-15: 400 mg
  Administered 2017-09-15 – 2017-09-21 (×13): 200 mg
  Filled 2017-09-14 (×17): qty 10

## 2017-09-14 MED ORDER — ACETAMINOPHEN 325 MG PO TABS
650.0000 mg | ORAL_TABLET | ORAL | Status: DC | PRN
  Administered 2017-09-14 – 2017-09-20 (×4): 650 mg via ORAL
  Filled 2017-09-14 (×4): qty 2

## 2017-09-14 NOTE — Plan of Care (Signed)
Problem: RH COGNITION-NURSING Goal: RH STG ANTICIPATES NEEDS/CALLS FOR ASSIST W/ASSIST/CUES STG Anticipates Needs/Calls for Assist With mod Assistance/Cues.  Outcome: Not Progressing Patient does not call, is impulsive and currently using telesitter monitoring.

## 2017-09-14 NOTE — Progress Notes (Signed)
Physical Therapy Session Note  Patient Details  Name: Suzanne Brooks MRN: [id number] Date of Birth: Apr 11, 1976  Today's Date: 09/14/2017 PT Individual Time: 1100-1200 PT Individual Time Calculation (min): 60 min   Short Term Goals: Week 2:  PT Short Term Goal 1 (Week 2): Pt will consistant perform all trasnfers with min assist PT Short Term Goal 2 (Week 2): Pt will perform bed mobility with supervisoin assist  PT Short Term Goal 3 (Week 2): Pt will ambulate 160f with min assist  PT Short Term Goal 4 (Week 2): Pt will propell WC 155fwith supervision assist consistantly   Skilled Therapeutic Interventions/Progress Updates: Pt presented in bed agreeable to therapy. Performed bed mobility supervision and donned L shoe with set up. PTA donned R shoe with RAFO for time management. Pt propelled to rehab gym supervision with improved obstacle negotiation. Performed gait with RW 10021fin guard with noted improved RW management when performing turns. Participated in obstacle course including cones, 4in step, and non-compliant surface, performed x 2 with cues for safety stepping over threshold and avoiding stepping on cones. Pt improved on second round. Standing tolerance on Airex x 8 min while pt participated in puzzle activity. Pt required mod cues for finding appropriate pieces and mod cues to correct when pieced did not match. Pt with no LOB during this time. Pt returned to room at end of session and returned to bed in same manner as prior. Pt left in bed with call bell within reach, bed alarm set, and needs met.      Therapy Documentation Precautions:  Precautions Precautions: Fall Restrictions Weight Bearing Restrictions: No  See Function Navigator for Current Functional Status.   Therapy/Group: Individual Therapy  Froilan Mclean  Charle Mclaurin, PTA  09/14/2017, 4:29 PM

## 2017-09-14 NOTE — Plan of Care (Signed)
Problem: RH PAIN MANAGEMENT Goal: RH STG PAIN MANAGED AT OR BELOW PT'S PAIN GOAL Managed pain < 4  Outcome: Not Progressing Pt complained of RUQ pain

## 2017-09-14 NOTE — Progress Notes (Signed)
Nutrition Follow-up  DOCUMENTATION CODES:   Not applicable  INTERVENTION:   Continue nocturnal tube feeds of Vital 1.5 formula via Cortrak NGT at goal rate of 60 ml/hr x 10 hours (7pm-5am) to provide 900 kcal (49% of kcal needs), 41 grams of protein (51% of protein needs), and 456 ml of water.   Continue free water flushes per tube until fluid intake is adequate.   Continue Ensure Enlive po TID, each supplement provides 350 kcal and 20 grams of protein.   Encourage PO intake.   NUTRITION DIAGNOSIS:   Inadequate oral intake related to dysphagia as evidenced by meal completion < 50%; improving  GOAL:   Patient will meet greater than or equal to 90% of their needs; met via TF/PO combination  MONITOR:   PO intake, Supplement acceptance, Labs, Weight trends, Skin, I & O's, Diet advancement, TF tolerance  REASON FOR ASSESSMENT:   Consult    ASSESSMENT:   41 y.o.right handed female with history of hypertension as well as reported seizures. Presented 07/30/2017 with seizure requiring intubation.  Cranial CT scan showed diffuse subarachnoid hemorrhage. 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 A continuous EEG showed generalized polymorphic delta slowing suggesting severe encephalopathy no seizure. 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 Dysphagia #2 diet and thin liquid diet as well as nasogastric tube feeds for nutritional support due to lack of appetite.  Diet has been advanced to a dysphagia 3 diet with thin liquids.  Meal completion continues to be varied from 25-70% with 25% at breakfast this AM. Pt reports no difficulties with her food at meal trays. Pt encouraged to consume her food at meals. Nocturnal tube feeds have been modified to infuse for 10 hours instead of 12 hours to encourage PO  intake throughout the day. Pt currently has Ensure ordered with varied intake. RD to continue with current orders. Will continue to monitor.   Diet Order:  DIET DYS 3 Room service appropriate? Yes; Fluid consistency: Thin  EDUCATION NEEDS:   Education needs have been addressed  Skin:  Skin Assessment: Reviewed RN Assessment (Incision on head and chest)  Last BM:  10/28  Height:   Ht Readings from Last 1 Encounters:  09/01/17 _0  (1.6 m)    Weight:   Wt Readings from Last 1 Encounters:  09/14/17 135 lb 5.8 oz (61.4 kg)    Ideal Body Weight:  52.27 kg  BMI:  Body mass index is 23.98 kg/m.  Estimated Nutritional Needs:   Kcal:  1850-2050  Protein:  80-95 grams  Fluid:  1.8 - 2 L/day  Corrin Parker, MS, RD, LDN Pager # [phone] After hours/ weekend pager # [phone]

## 2017-09-14 NOTE — Progress Notes (Signed)
Speech Language Pathology Daily Session Note  Patient Details  Name: Suzanne Brooks MRN: [id number] Date of Birth: Jan 15, 1976  Today's Date: 09/14/2017 SLP Individual Time: 0730-0830 SLP Individual Time Calculation (min): 60 min  Short Term Goals: Week 2: SLP Short Term Goal 1 (Week 2): Pt will sustain attention to basic task for ~ 15 minutes with supervision A cues.  SLP Short Term Goal 2 (Week 2): Pt will initiate basic familiar task within 60% of opportunities and Mod A cues.  SLP Short Term Goal 3 (Week 2): Pt will verbally respond to question in timely manner in 60% of opportunities and Mod A cues.  SLP Short Term Goal 4 (Week 2): Pt will increase vocal intensity to achieve ~ 75% intelligibility at the sentence level with Min A cues.  SLP Short Term Goal 5 (Week 2): Pt will demonstrate consistent orientation x 4 with Min A cues.  SLP Short Term Goal 6 (Week 2): Pt will consume dysphagia 3 diet trials with minimal overt s/s of aspiration with Min A cues for use of compensatory swallow strategies.   Skilled Therapeutic Interventions: Skilled treatment session focused on dysphagia and cognition goals. SLP received pt in bed and required more than a reasonable amount of time to arouse. Pt required Min A cues for safe transfer from bed to wheelchair. SLP facilitated session by providing skilled observation of pt consuming dysphagia 3 breakfast tray with thin liquids. Pt good oral abilities and no overt s/s of aspiration. Of note, pt with increased consumption of dysphagia 3. Therefore recommend pt be upgraded to dysphagia 3. Pt with increased task initiation but no intellectual awareness of deficits being targeted in therapy sessions. Pt was returned to room, left in bed with bed alarm on and all needs within reach. Continue per current plan of care.      Function:  Eating Eating   Modified Consistency Diet: Yes Eating Assist Level: Supervision or verbal cues;More than reasonable amount of  time           Cognition Comprehension Comprehension assist level: Understands basic 50 - 74% of the time/ requires cueing 25 - 49% of the time  Expression   Expression assist level: Expresses basic 50 - 74% of the time/requires cueing 25 - 49% of the time. Needs to repeat parts of sentences.  Social Interaction Social Interaction assist level: Interacts appropriately 50 - 74% of the time - May be physically or verbally inappropriate.  Problem Solving Problem solving assist level: Solves basic 25 - 49% of the time - needs direction more than half the time to initiate, plan or complete simple activities  Memory Memory assist level: Recognizes or recalls 25 - 49% of the time/requires cueing 50 - 75% of the time    Pain Pain Assessment Pain Assessment: No/denies pain Pain Score: 0-No pain  Therapy/Group: Individual Therapy  Kyshaun Barnette 09/14/2017, 8:28 AM

## 2017-09-14 NOTE — Progress Notes (Signed)
Subjective/Complaints: No Issues overnite, reviewed intake, inconsistent but improving ROS: pt denies nausea, vomiting, diarrhea, cough, shortness of breath or chest pain   Objective: Vital Signs: Blood pressure (!) 149/100, pulse 87, temperature 98.2 F (36.8 C), temperature source Oral, resp. rate 16, height 5\' 3"  (1.6 m), weight 61.4 kg (135 lb 5.8 oz), SpO2 98 %. No results found. No results found for this or any previous visit (from the past 72 hour(s)).   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 +NGT Cardio: RRR without murmur. No JVD   Resp: CTA Bilaterally without wheezes or rales. Normal effort   GI: BS positive, non tender Skin:   Other RIght upper chest wound from tunneled cath, CDI Neuro: fairly alert, delayed processing Nonverbal.  Motor 4+/5 B delt, bi, tri, grip  RLE: 3-/5 HF, KE Ankle PF, 0 ankle DF---unchanged Musc/Skel:  No edema. No tenderness. Gen NAD. Vital signs reviewed. Psych: Nonverbal.  Assessment/Plan: 1. Functional deficits secondary to Right periopthalmic SAH with cognitive deficits, Left frontal IPH and gait disorder,RLE >LLE weakness  which require 3+ hours per day of interdisciplinary therapy in a comprehensive inpatient rehab setting. Physiatrist is providing close team supervision and 24 hour management of active medical problems listed below. Physiatrist and rehab team continue to assess barriers to discharge/monitor patient progress toward functional and medical goals. FIM: Function - Bathing Position: Shower Body parts bathed by patient: Right arm, Left arm, Chest, Abdomen, Front perineal area, Buttocks, Right upper leg, Left upper leg, Left lower leg, Right lower leg Body parts bathed by helper: Back Assist Level: Touching or steadying assistance(Pt > 75%)  Function- Upper Body Dressing/Undressing What is the patient wearing?: Pull over shirt/dress Pull over shirt/dress - Perfomed by patient: Thread/unthread  right sleeve, Thread/unthread left sleeve, Put head through opening, Pull shirt over trunk Pull over shirt/dress - Perfomed by helper: Put head through opening, Pull shirt over trunk Assist Level: Supervision or verbal cues Function - Lower Body Dressing/Undressing What is the patient wearing?: Pants, Non-skid slipper socks Position: Wheelchair/chair at sink Pants- Performed by patient: Thread/unthread right pants leg, Thread/unthread left pants leg, Pull pants up/down Pants- Performed by helper: Thread/unthread left pants leg, Pull pants up/down Non-skid slipper socks- Performed by patient: Don/doff right sock, Don/doff left sock Non-skid slipper socks- Performed by helper: Don/doff right sock Shoes - Performed by helper: Don/doff right shoe, Don/doff left shoe, Fasten right, Fasten left Assist for footwear: Supervision/touching assist Assist for lower body dressing: Touching or steadying assistance (Pt > 75%)  Function - Toileting Toileting activity did not occur: Safety/medical concerns Toileting steps completed by patient: Adjust clothing prior to toileting, Performs perineal hygiene, Adjust clothing after toileting Toileting steps completed by helper: Adjust clothing prior to toileting, Performs perineal hygiene, Adjust clothing after toileting Toileting Assistive Devices: Grab bar or rail Assist level: Touching or steadying assistance (Pt.75%)  Function - ArchivistToilet Transfers Toilet transfer activity did not occur: Safety/medical concerns Toilet transfer assistive device: Bedside commode Mechanical lift: Stedy Assist level to toilet: Touching or steadying assistance (Pt > 75%) Assist level from toilet: Touching or steadying assistance (Pt > 75%) Assist level to bedside commode (at bedside): Touching or steadying assistance (Pt > 75%) Assist level from bedside commode (at bedside): Touching or steadying assistance (Pt > 75%)  Function - Chair/bed transfer Chair/bed transfer method:  Stand pivot Chair/bed transfer assist level: Touching or steadying assistance (Pt > 75%) Chair/bed transfer assistive device: Armrests Chair/bed transfer details: Verbal cues for safe use of DME/AE  Function - Locomotion: Wheelchair Type: Manual Max wheelchair distance: 72ft Assist Level: Touching or steadying assistance (Pt > 75%) Wheel 50 feet with 2 turns activity did not occur: Refused Assist Level: Touching or steadying assistance (Pt > 75%) Wheel 150 feet activity did not occur: Safety/medical concerns Turns around,maneuvers to table,bed, and toilet,negotiates 3% grade,maneuvers on rugs and over doorsills: No Function - Locomotion: Ambulation Ambulation activity did not occur: Safety/medical concerns Assistive device: Walker-rolling Max distance: 297ft  Assist level: Touching or steadying assistance (Pt > 75%) Walk 10 feet activity did not occur: Safety/medical concerns Assist level: Supervision or verbal cues Assist level: Touching or steadying assistance (Pt > 75%) Walk 150 feet activity did not occur: Safety/medical concerns Assist level: Touching or steadying assistance (Pt > 75%) Walk 10 feet on uneven surfaces activity did not occur: Safety/medical concerns  Function - Comprehension Comprehension: Auditory Comprehension assist level: Understands basic 50 - 74% of the time/ requires cueing 25 - 49% of the time  Function - Expression Expression: Verbal Expression assist level: Expresses basic 50 - 74% of the time/requires cueing 25 - 49% of the time. Needs to repeat parts of sentences.  Function - Social Interaction Social Interaction assist level: Interacts appropriately 50 - 74% of the time - May be physically or verbally inappropriate.  Function - Problem Solving Problem solving assist level: Solves basic 25 - 49% of the time - needs direction more than half the time to initiate, plan or complete simple activities  Function - Memory Memory assist level: Recognizes  or recalls 25 - 49% of the time/requires cueing 50 - 75% of the time Patient normally able to recall (first 3 days only): That he or she is in a hospital  Medical Problem List and Plan: 1. 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 DVT Prophylaxis/Anticoagulation: SCDs 3. Pain Management: Tylenol as needed 4. Mood: Provide emotional support  More alert overall    5. Neuropsych: This patient is notcapable of making decisions on herown behalf.  Fall safety precautions---still with poor safety awareness  6. Skin/Wound Care: Routine skin checks 7. Fluids/Electrolytes/Nutrition: Routine I&Os, add megace 8.Seizure disorder.Keppra 1000 mg twice a day 9.Dysphagia. Dysphagia #2 thin liquids/nasogastric tube feeds to supplement---remove when able to adequately meet nutritional needs--follow daily I's and O's. 10.Hypertension.  -DBP elevated particularly. ---increased labetalol to 300mg  BID on 10/27 -check bp's q shift and with therapy.   -may need further titration, add amlodipine 2.5mg  10/29   Vitals:   09/13/17 2011 09/14/17 0525  BP: (!) 162/107 (!) 149/100  Pulse: 78 87  Resp:  16  Temp:  98.2 F (36.8 C)  SpO2:  98%   11.Leukocytosis.   UTI       12. ABLA  Hb 8.4 on 10/17  Cont to monitor 13. UTI, Escherichia coli,   Keflex-7 day course  completed  LOS (Days) 13 A FACE TO FACE EVALUATION WAS PERFORMED  KIRSTEINS,ANDREW E 09/14/2017, 8:55 AM

## 2017-09-14 NOTE — Progress Notes (Addendum)
Occupational Therapy Session Note  Patient Details  Name: Suzanne CatenaChristie Vonseggern MRN: [id number] Date of Birth: 27-Oct-1976  Today's Date: 09/14/2017 OT Individual Time: 0900-1015 OT Individual Time Calculation (min): 75 min    Short Term Goals: Week 2:  OT Short Term Goal 1 (Week 2): Pt will terminate bathing tasks with no more than 1 VC OT Short Term Goal 2 (Week 2): Pt will don B shoes with supervision OT Short Term Goal 3 (Week 2): Pt will maintain selective attention for LB dressing with no more than 1 VC OT Short Term Goal 4 (Week 2): Pt will transfer to tub/shower with touching A  Skilled Therapeutic Interventions/Progress Updates:    Pt received supine in bed agreeable to OT tx session. Pt completed bathing at shower level this session, ambulating to bathroom and TTB using RW with close minguard. Pt completed bathing at overall supervision level with assist to wash back and min verbal cues for safety during task completion. Pt washes body parts multiple times during task completion, requires Min verbal questioning cues to terminate task. Pt preferring to don gown after bathing vs her own clothing, dons underwear and socks seated EOB with close guard for safety when standing to advance underwear over hips. Pt then self propelled w/c to BI gym and dayroom where she participated in dynamic and static standing activities including dynavision, completing in standing with increased challenge of standing on foam pad and with close MinGuard during standing activity. In dayroom Pt completed 25 piece puzzle, initially in standing and then in sitting due to increased fatigue. Pt requires mod verbal cues and increased time to complete puzzle. Pt self-propelled w/c back to room end of session where Pt was left supine in bed, bed alarm activated, call bell and needs within reach.   Therapy Documentation Precautions:  Precautions Precautions: Fall Restrictions Weight Bearing Restrictions: No   Pain: Pain  Assessment Pain Assessment: Faces Faces Pain Scale: No hurt  See Function Navigator for Current Functional Status.   Therapy/Group: Individual Therapy  Orlando PennerBreanna L Kooper Chriswell 09/14/2017, 12:35 PM

## 2017-09-15 ENCOUNTER — Inpatient Hospital Stay (HOSPITAL_COMMUNITY): Payer: Medicaid Other | Admitting: Occupational Therapy

## 2017-09-15 ENCOUNTER — Inpatient Hospital Stay (HOSPITAL_COMMUNITY): Payer: Medicaid Other | Admitting: Physical Therapy

## 2017-09-15 ENCOUNTER — Ambulatory Visit (HOSPITAL_COMMUNITY): Payer: Medicaid Other

## 2017-09-15 NOTE — Progress Notes (Signed)
Occupational Therapy Session Note  Patient Details  Name: Suzanne Brooks MRN: [id number] Date of Birth: 01/29/1976  Today's Date: 09/15/2017 OT Individual Time: 1300-1400 OT Individual Time Calculation (min): 60 min    Short Term Goals: Week 1:  OT Short Term Goal 1 (Week 1): Pt will sit EOB with min A during bathing task OT Short Term Goal 1 - Progress (Week 1): Met OT Short Term Goal 2 (Week 1): Pt will complete sit <> stand at sink with max A +1 in prep for functional task OT Short Term Goal 2 - Progress (Week 1): Met OT Short Term Goal 3 (Week 1): Pt will complete set-up of oral care task with no more than 1 VC for sequencing OT Short Term Goal 3 - Progress (Week 1): Met Week 2:  OT Short Term Goal 1 (Week 2): Pt will terminate bathing tasks with no more than 1 VC OT Short Term Goal 2 (Week 2): Pt will don B shoes with supervision OT Short Term Goal 3 (Week 2): Pt will maintain selective attention for LB dressing with no more than 1 VC OT Short Term Goal 4 (Week 2): Pt will transfer to tub/shower with touching A  Skilled Therapeutic Interventions/Progress Updates:    1:1 Participated in therapeutic activity in the kitchen doing prep and rolling out sausage balls and helping with planning for fall party on unit tomorrow. Pt oriented and required steadying A for functional ambulation without AD with AFO donned. Pt demonstrating overall supervision for cognition throughout session.    Therapy Documentation Precautions:  Precautions Precautions: Fall Restrictions Weight Bearing Restrictions: No Pain:  no c/o pain   See Function Navigator for Current Functional Status.   Therapy/Group: Individual Therapy  Willeen Cass [facility] 09/15/2017, 2:13 PM

## 2017-09-15 NOTE — Progress Notes (Signed)
Occupational Therapy Session Note  Patient Details  Name: Enrigue CatenaChristie Goerner MRN: [id number] Date of Birth: 05/31/1976  Today's Date: 09/15/2017 OT Individual Time: 1000-1030 OT Individual Time Calculation (min): 30 min    Short Term Goals: Week 2:  OT Short Term Goal 1 (Week 2): Pt will terminate bathing tasks with no more than 1 VC OT Short Term Goal 2 (Week 2): Pt will don B shoes with supervision OT Short Term Goal 3 (Week 2): Pt will maintain selective attention for LB dressing with no more than 1 VC OT Short Term Goal 4 (Week 2): Pt will transfer to tub/shower with touching A  Skilled Therapeutic Interventions/Progress Updates:    Pt seen for OT ADL bathing/dressing session. Pt in supine upon arrival, easily awoken and agreeable to tx session. She ambulated into bathroom using RW, requiring CGA and  max VCs for proper use of RW as pt carrying it while ambulating and not turning with device. She bathed with distant supervision seated on tub bench. Pt impulsive with exiting shower, initiating ambulation without assist, and ambulating without use of RW, requiring min-mod steadying assist for dynamic balance. Declined changing into clothes, opting for hospital gown which she donned with set-up. She returned to bed at end of session, left with all needs in reach and bed alarm on.   Therapy Documentation Precautions:  Precautions Precautions: Fall Restrictions Weight Bearing Restrictions: No Pain: Pain Assessment Pain Assessment: No/denies pain  See Function Navigator for Current Functional Status.   Therapy/Group: Individual Therapy  Lewis, Zephyr Ridley C 09/15/2017, 10:36 AM

## 2017-09-15 NOTE — Progress Notes (Signed)
Subjective/Complaints: Discussed improving intake with pt and her RN ROS: pt denies nausea, vomiting, diarrhea, cough, shortness of breath or chest pain   Objective: Vital Signs: Blood pressure 122/79, pulse 75, temperature 98.5 F (36.9 C), temperature source Oral, resp. rate 18, height 5\' 3"  (1.6 m), weight 60.7 kg (133 lb 13.1 oz), SpO2 99 %. No results found. No results found for this or any previous visit (from the past 72 hour(s)).   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 +NGT Cardio: RRR without murmur. No JVD   Resp: CTA Bilaterally without wheezes or rales. Normal effort   GI: BS positive, non tender Skin:   Other RIght upper chest wound from tunneled cath, CDI Neuro: fairly alert, delayed processing Nonverbal.  Motor 4+/5 B delt, bi, tri, grip  RLE: 3-/5 HF, KE Ankle PF, 0 ankle DF---unchanged Musc/Skel:  No edema. No tenderness. Gen NAD. Vital signs reviewed. Psych: Nonverbal.  Assessment/Plan: 1. Functional deficits secondary to Right periopthalmic SAH with cognitive deficits, Left frontal IPH and gait disorder,RLE >LLE weakness  which require 3+ hours per day of interdisciplinary therapy in a comprehensive inpatient rehab setting. Physiatrist is providing close team supervision and 24 hour management of active medical problems listed below. Physiatrist and rehab team continue to assess barriers to discharge/monitor patient progress toward functional and medical goals. FIM: Function - Bathing Position: Shower Body parts bathed by patient: Right arm, Left arm, Chest, Abdomen, Front perineal area, Buttocks, Right upper leg, Left upper leg, Left lower leg, Right lower leg Body parts bathed by helper: Back Assist Level: Touching or steadying assistance(Pt > 75%)  Function- Upper Body Dressing/Undressing What is the patient wearing?: Hospital gown Pull over shirt/dress - Perfomed by patient: Thread/unthread right sleeve, Thread/unthread  left sleeve Pull over shirt/dress - Perfomed by helper: Put head through opening, Pull shirt over trunk Assist Level: Supervision or verbal cues Function - Lower Body Dressing/Undressing What is the patient wearing?: Underwear, Non-skid slipper socks Position: Wheelchair/chair at sink Underwear - Performed by patient: Thread/unthread right underwear leg, Thread/unthread left underwear leg, Pull underwear up/down Pants- Performed by patient: Thread/unthread right pants leg, Thread/unthread left pants leg, Pull pants up/down Pants- Performed by helper: Thread/unthread left pants leg, Pull pants up/down Non-skid slipper socks- Performed by patient: Don/doff right sock, Don/doff left sock Non-skid slipper socks- Performed by helper: Don/doff right sock Shoes - Performed by helper: Don/doff right shoe, Don/doff left shoe, Fasten right, Fasten left Assist for footwear: Supervision/touching assist Assist for lower body dressing: Supervision or verbal cues  Function - Toileting Toileting activity did not occur: Safety/medical concerns Toileting steps completed by patient: Adjust clothing prior to toileting, Performs perineal hygiene, Adjust clothing after toileting Toileting steps completed by helper: Adjust clothing prior to toileting, Performs perineal hygiene, Adjust clothing after toileting Toileting Assistive Devices: Grab bar or rail Assist level: Touching or steadying assistance (Pt.75%)  Function - ArchivistToilet Transfers Toilet transfer activity did not occur: Safety/medical concerns Toilet transfer assistive device: Bedside commode Mechanical lift: Stedy Assist level to toilet: Touching or steadying assistance (Pt > 75%) Assist level from toilet: Touching or steadying assistance (Pt > 75%) Assist level to bedside commode (at bedside): Touching or steadying assistance (Pt > 75%) Assist level from bedside commode (at bedside): Touching or steadying assistance (Pt > 75%)  Function - Chair/bed  transfer Chair/bed transfer method: Ambulatory Chair/bed transfer assist level: Touching or steadying assistance (Pt > 75%) Chair/bed transfer assistive device: Armrests Chair/bed transfer details: Verbal cues for safe use  of DME/AE  Function - Locomotion: Wheelchair Type: Manual Max wheelchair distance: 49ft Assist Level: Touching or steadying assistance (Pt > 75%) Wheel 50 feet with 2 turns activity did not occur: Refused Assist Level: Touching or steadying assistance (Pt > 75%) Wheel 150 feet activity did not occur: Safety/medical concerns Turns around,maneuvers to table,bed, and toilet,negotiates 3% grade,maneuvers on rugs and over doorsills: No Function - Locomotion: Ambulation Ambulation activity did not occur: Safety/medical concerns Assistive device: Walker-rolling Max distance: 255ft  Assist level: Touching or steadying assistance (Pt > 75%) Walk 10 feet activity did not occur: Safety/medical concerns Assist level: Supervision or verbal cues Assist level: Touching or steadying assistance (Pt > 75%) Walk 150 feet activity did not occur: Safety/medical concerns Assist level: Touching or steadying assistance (Pt > 75%) Walk 10 feet on uneven surfaces activity did not occur: Safety/medical concerns  Function - Comprehension Comprehension: Auditory Comprehension assist level: Understands basic 50 - 74% of the time/ requires cueing 25 - 49% of the time  Function - Expression Expression: Verbal Expression assist level: Expresses basic 50 - 74% of the time/requires cueing 25 - 49% of the time. Needs to repeat parts of sentences.  Function - Social Interaction Social Interaction assist level: Interacts appropriately 50 - 74% of the time - May be physically or verbally inappropriate.  Function - Problem Solving Problem solving assist level: Solves basic 25 - 49% of the time - needs direction more than half the time to initiate, plan or complete simple activities  Function -  Memory Memory assist level: Recognizes or recalls 25 - 49% of the time/requires cueing 50 - 75% of the time Patient normally able to recall (first 3 days only): That he or she is in a hospital  Medical Problem List and Plan: 1. 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 DVT Prophylaxis/Anticoagulation: SCDs 3. Pain Management: Tylenol as needed 4. Mood: Provide emotional support  More alert overall    5. Neuropsych: This patient is notcapable of making decisions on herown behalf.  Fall safety precautions---still with poor safety awareness  6. Skin/Wound Care: Routine skin checks 7. Fluids/Electrolytes/Nutrition: Routine I&Os, add megace 8.Seizure disorder.Keppra 1000 mg twice a day 9.Dysphagia. Dysphagia #2 thin liquids/nasogastric tube feeds to supplement---remove when able to adequately meet nutritional needs--follow daily I's and O's. 10.Hypertension.  -DBP elevated particularly. ---increased labetalol to 300mg  BID on 10/27 -check bp's q shift and with therapy.   -may need further titration, add amlodipine 2.5mg  10/29 Controlled 10/30   Vitals:   09/14/17 2013 09/15/17 0538  BP: 130/89 122/79  Pulse: 89 75  Resp:  18  Temp:  98.5 F (36.9 C)  SpO2:  99%   11.Leukocytosis.   UTI  , s/p treatment will recheck CBC in am     12. ABLA  Hb 8.4 on 10/17, check CBC in am Cont to monitor   LOS (Days) 14 A FACE TO FACE EVALUATION WAS PERFORMED  KIRSTEINS,ANDREW E 09/15/2017, 8:05 AM

## 2017-09-15 NOTE — Progress Notes (Signed)
DHT removed from Right nare. Pt tolearated well. Tube tip intact upon removal.

## 2017-09-15 NOTE — Progress Notes (Signed)
Speech Language Pathology Daily Session Note  Patient Details  Name: Enrigue CatenaChristie Gutmann MRN: [id number] Date of Birth: [birth year]  Today's Date: 09/15/2017 SLP Individual Time: 0809-0900 SLP Individual Time Calculation (min): 51 min  Short Term Goals: Week 2: SLP Short Term Goal 1 (Week 2): Pt will sustain attention to basic task for ~ 15 minutes with supervision A cues.  SLP Short Term Goal 2 (Week 2): Pt will initiate basic familiar task within 60% of opportunities and Mod A cues.  SLP Short Term Goal 3 (Week 2): Pt will verbally respond to question in timely manner in 60% of opportunities and Mod A cues.  SLP Short Term Goal 4 (Week 2): Pt will increase vocal intensity to achieve ~ 75% intelligibility at the sentence level with Min A cues.  SLP Short Term Goal 5 (Week 2): Pt will demonstrate consistent orientation x 4 with Min A cues.  SLP Short Term Goal 6 (Week 2): Pt will consume dysphagia 3 diet trials with minimal overt s/s of aspiration with Min A cues for use of compensatory swallow strategies.   Skilled Therapeutic Interventions: Skilled ST services focused on swallow and cognitive skills. SLP facilitated PO consumption of Dys 3 and thin liquids requiring Min A verbal cues to initiate PO consumption and Max a verbal cues to encourage continued PO intake, resulting in 50% intake. SLP educated pt on how to place standing breakfast order and request desired lunch and dinner order in order to increase PO intake, pt demonstrated ability to place order with Min A verbal cues. Pt participated in regular textured trials independently preforming liquid wash to clear oral cavity and no overt s/s aspiration and requiring Min A verbal cues to consume small bites. Pt demonstrated ability to identify safe problems in basic problem solving task given visual picture scene of a kitchen verbal responding to questions in 90% of presented opportunties, requiring mod-max a verbal cues to expand answer, however  accurate. Pt demonstrated sustained attention for 15 minutes intervals with supervision question cues. Pt was orientated to place, time and situation with Min A visual aid cues. Pt was left in room with call bell within reach. Continue ST services.      Function:  Eating Eating   Modified Consistency Diet: Yes Eating Assist Level: More than reasonable amount of time;Supervision or verbal cues           Cognition Comprehension Comprehension assist level: Understands basic 50 - 74% of the time/ requires cueing 25 - 49% of the time  Expression   Expression assist level: Expresses basic 50 - 74% of the time/requires cueing 25 - 49% of the time. Needs to repeat parts of sentences.  Social Interaction Social Interaction assist level: Interacts appropriately 50 - 74% of the time - May be physically or verbally inappropriate.  Problem Solving Problem solving assist level: Solves basic 25 - 49% of the time - needs direction more than half the time to initiate, plan or complete simple activities  Memory Memory assist level: Recognizes or recalls 25 - 49% of the time/requires cueing 50 - 75% of the time    Pain Pain Assessment Pain Assessment: No/denies pain  Therapy/Group: Individual Therapy  Rayen Palen  [facility] 09/15/2017, 4:33 PM

## 2017-09-15 NOTE — Progress Notes (Signed)
Physical Therapy Session Note  Patient Details  Name: Suzanne Brooks MRN: [id number] Date of Birth: May 15, 1976  Today's Date: 09/15/2017 PT Individual Time: 1105-1205 PT Individual Time Calculation (min): 60 min   Short Term Goals: Week 2:  PT Short Term Goal 1 (Week 2): Pt will consistant perform all trasnfers with min assist PT Short Term Goal 2 (Week 2): Pt will perform bed mobility with supervisoin assist  PT Short Term Goal 3 (Week 2): Pt will ambulate 14950ft with min assist  PT Short Term Goal 4 (Week 2): Pt will propell WC 14150ft with supervision assist consistantly   Skilled Therapeutic Interventions/Progress Updates:  Pt presented in w/c agreeable to therapy. Pt performed bed mobility with supervision, pt donned pants and required cues for safety when pulling up pants due to pt standing on pant legs. Pt performed stand pivot to w/c. Pt propelled to day room and performed Biodex LOS on static level and L12. Pt noted to have decreased hip strategy and would tend to lead with shoulders however was able to correct with tactile cues. Pt propelled to rehab gym and performed standing dynamic balance on Airex,. Pt able to maintain fair balance with no LOB while standing on Airex however once pt stepped off pt with stagger posterlaterally ro R and required minA for recovery. Performed toe taps to cones and alternating toe taps initially requiring minA with improvement. Pt propelled back to room, ambulated to BR no AD with min guard (+void) and returned to bed with bed alarm on and call bell within reach.      Therapy Documentation Precautions:  Precautions Precautions: Fall Restrictions Weight Bearing Restrictions: No General:   Vital Signs: Therapy Vitals Temp: 98.2 F (36.8 C) Temp Source: Oral Pulse Rate: 83 Resp: 20 BP: 116/67 Patient Position (if appropriate): Lying Oxygen Therapy SpO2: 100 % O2 Device: Not Delivered   See Function Navigator for Current Functional  Status.   Therapy/Group: Individual Therapy  Matther Labell  Tuwanna Krausz, PTA  09/15/2017, 4:38 PM

## 2017-09-16 ENCOUNTER — Inpatient Hospital Stay (HOSPITAL_COMMUNITY): Payer: Medicaid Other | Admitting: Speech Pathology

## 2017-09-16 ENCOUNTER — Inpatient Hospital Stay (HOSPITAL_COMMUNITY): Payer: Medicaid Other | Admitting: Occupational Therapy

## 2017-09-16 ENCOUNTER — Inpatient Hospital Stay (HOSPITAL_COMMUNITY): Payer: Medicaid Other | Admitting: Physical Therapy

## 2017-09-16 LAB — CBC WITH DIFFERENTIAL/PLATELET
BASOS PCT: 0 %
Basophils Absolute: 0 10*3/uL (ref 0.0–0.1)
EOS ABS: 0.5 10*3/uL (ref 0.0–0.7)
EOS PCT: 4 %
HCT: 28.9 % — ABNORMAL LOW (ref 36.0–46.0)
HEMOGLOBIN: 9.1 g/dL — AB (ref 12.0–15.0)
LYMPHS PCT: 19 %
Lymphs Abs: 2.1 10*3/uL (ref 0.7–4.0)
MCH: 26.8 pg (ref 26.0–34.0)
MCHC: 31.5 g/dL (ref 30.0–36.0)
MCV: 85.3 fL (ref 78.0–100.0)
MONO ABS: 1 10*3/uL (ref 0.1–1.0)
Monocytes Relative: 9 %
NEUTROS PCT: 68 %
Neutro Abs: 7.7 10*3/uL (ref 1.7–7.7)
PLATELETS: 307 10*3/uL (ref 150–400)
RBC: 3.39 MIL/uL — AB (ref 3.87–5.11)
RDW: 19.1 % — ABNORMAL HIGH (ref 11.5–15.5)
WBC: 11.3 10*3/uL — AB (ref 4.0–10.5)

## 2017-09-16 NOTE — Patient Care Conference (Signed)
Inpatient RehabilitationTeam Conference and Plan of Care Update Date: 09/16/2017   Time: 11:15 AM    Patient Name: Suzanne Brooks      Medical Record Number: [id number]  Date of Birth: 1976/01/15 Sex: Female         Room/Bed: 4W10C/4W10C-01 Payor Info: Payor: MEDICAID [place] / Plan: MEDICAID [place] ACCESS / Product Type: *No Product type* /    Admitting Diagnosis: sah  Admit Date/Time:  09/01/2017  4:34 PM Admission Comments: No comment available   Primary Diagnosis:  <principal problem not specified> Principal Problem: <principal problem not specified>  Patient Active Problem List   Diagnosis Date Noted  . Acute blood loss anemia   . Benign essential HTN   . Leukocytosis   . Dysphagia   . Endotracheal tube present   . ICH (intracerebral hemorrhage) ([place]) 07/30/2017  . SAH (subarachnoid hemorrhage) ([place]) 07/30/2017  . Seizure-like activity ([place])   . Acute respiratory failure with hypoxemia [facility])     Expected Discharge Date: Expected Discharge Date: 09/22/17  Team Members Present: Physician leading conference: Dr. Alysia Penna Social Worker Present: Ovidio Kin, LCSW Nurse Present: Dorthula Nettles, RN PT Present: Barrie Folk, PT;Rosita Dechalus, PTA OT Present: Napoleon Form, OT SLP Present: Stormy Fabian, SLP PPS Coordinator present : Daiva Nakayama, RN, CRRN     Current Status/Progress Goal Weekly Team Focus  Medical   ntake, improving, attention improving, headache, improving  . Safe and adequate by mouth, reduction in fall risk  . Remove feeding tube, monitor weights and intake   Bowel/Bladder   Continent of bowel and bladder, Last BM 10/30  Min assist  Provide assist as needed   Swallow/Nutrition/ Hydration   dysphagia 3 with thin liquids - cognition impairs consumption  supervision  related to cognitive deficits, task initiation   ADL's   min a for functional ambulation, sit to stands with supervision,   goals upgraded to supervision; mod I goals upgraded for  transfers  functional mobility, cognive retraining, awareness, attention    Mobility   supervision bed mobility, min guard transfers, gait 136f with RW min guard, min guard wc mobility >2081f Min assist overall with LRAD for transfers and gait. supervision assist for WC mobility.   dynamic balance, gait, endurance   Communication   supervision cues for simple conversation  supervision - goal met  use of speech intelligibility strategies   Safety/Cognition/ Behavioral Observations  Max A to occasional Mod A for task initiation, basic problem solving, sustained attention  Min A  task initiation, sustained attention, basic problem solving   Pain   Denies pain  <3  Assess qshift and PRN   Skin   Rash to arm, thighs and buttocks  Prevent breakdown and infection  Assess Qshift and PRN      *See Care Plan and progress notes for long and short-term goals.     Barriers to Discharge  Current Status/Progress Possible Resolutions Date Resolved   Physician    Medication compliance;Inaccessible home environment;Decreased caregiver support  steps to enter her apartment  progressing towards goal  should be able to move up discharge date due to improvements and progress      Nursing                  PT                    OT                  SLP  Inaccessible home environment;Decreased caregiver support;Lack of/limited family support;Nutrition means              SW                Discharge Planning/Teaching Needs:  Plan to go Mom's home, hopefully can provide care      Team Discussion:  Making good progress in therapies, passed MBS NG tube discharged 10/30. Dys 3 thin diet. Megace helping with intake. Impulsive in movements. Move up DC date due to progress in therapies. Upgrade goals mod/i level  Revisions to Treatment Plan:  DC 11/6 Upgraded goals to mod/i level    Continued Need for Acute Rehabilitation Level of Care: The patient requires daily medical management by a physician with  specialized training in physical medicine and rehabilitation for the following conditions: Daily direction of a multidisciplinary physical rehabilitation program to ensure safe treatment while eliciting the highest outcome that is of practical value to the patient.: Yes Daily medical management of patient stability for increased activity during participation in an intensive rehabilitation regime.: Yes Daily analysis of laboratory values and/or radiology reports with any subsequent need for medication adjustment of medical intervention for : Neurological problems;Post surgical problems;Nutritional problems  Seva Chancy, Gardiner Rhyme 09/18/2017, 9:16 AM

## 2017-09-16 NOTE — Progress Notes (Signed)
Nutrition Follow-up  DOCUMENTATION CODES:   Not applicable  INTERVENTION:  Continue Ensure Enlive po TID, each supplement provides 350 kcal and 20 grams of protein.  Encourage adequate PO intake.   NUTRITION DIAGNOSIS:   Inadequate oral intake related to dysphagia as evidenced by meal completion < 50%; improving  GOAL:   Patient will meet greater than or equal to 90% of their needs; progressing  MONITOR:   PO intake, Supplement acceptance, Labs, Weight trends, Diet advancement, Skin, I & O's  REASON FOR ASSESSMENT:   Consult    ASSESSMENT:   41 y.o.right handed female with history of hypertension as well as reported seizures. Presented 07/30/2017 with seizure requiring intubation.  Cranial CT scan showed diffuse subarachnoid hemorrhage. 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 A continuous EEG showed generalized polymorphic delta slowing suggesting severe encephalopathy no seizure. 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 Dysphagia #2 diet and thin liquid diet as well as nasogastric tube feeds for nutritional support due to lack of appetite.  NGT removed yesterday. Tube feeds have been discontinued. Meal completion 90% this AM. Pt continues on a dysphagia 3 diet with thin liquids.  Pt currently has Ensure ordered and has been consuming most of them. RD to continue with current order to ensure adequate nutrition is met.   Labs and medications reviewed.   Diet Order:  DIET DYS 3 Room service appropriate? Yes; Fluid consistency: Thin  EDUCATION NEEDS:   Education needs have been addressed  Skin:  Skin Assessment:  (Incision on head and chest)  Last BM:  10/30  Height:   Ht Readings from Last 1 Encounters:  09/01/17 5' 3"  (1.6 m)    Weight:   Wt Readings from Last 1 Encounters:   09/16/17 134 lb 7.7 oz (61 kg)    Ideal Body Weight:  52.27 kg  BMI:  Body mass index is 23.82 kg/m.  Estimated Nutritional Needs:   Kcal:  1850-2050  Protein:  80-95 grams  Fluid:  1.8 - 2 L/day   Corrin Parker, MS, RD, LDN Pager # [phone] After hours/ weekend pager # [phone]

## 2017-09-16 NOTE — Progress Notes (Signed)
Speech Language Pathology Weekly Progress and Session Note  Patient Details  Name: Suzanne Brooks MRN: [id number] Date of Birth: Aug 10, 1976  Beginning of progress report period: September 09, 2017 End of progress report period: September 16, 2017  Today's Date: 09/16/2017 SLP Individual Time: 9622-2979 SLP Individual Time Calculation (min): 45 min  Short Term Goals: Week 2: SLP Short Term Goal 1 (Week 2): Pt will sustain attention to basic task for ~ 15 minutes with supervision A cues.  SLP Short Term Goal 1 - Progress (Week 2): Met SLP Short Term Goal 2 (Week 2): Pt will initiate basic familiar task within 60% of opportunities and Mod A cues.  SLP Short Term Goal 2 - Progress (Week 2): Met SLP Short Term Goal 3 (Week 2): Pt will verbally respond to question in timely manner in 60% of opportunities and Mod A cues.  SLP Short Term Goal 3 - Progress (Week 2): Met SLP Short Term Goal 4 (Week 2): Pt will increase vocal intensity to achieve ~ 75% intelligibility at the sentence level with Min A cues.  SLP Short Term Goal 4 - Progress (Week 2): Met SLP Short Term Goal 5 (Week 2): Pt will demonstrate consistent orientation x 4 with Min A cues.  SLP Short Term Goal 5 - Progress (Week 2): Met SLP Short Term Goal 6 (Week 2): Pt will consume dysphagia 3 diet trials with minimal overt s/s of aspiration with Min A cues for use of compensatory swallow strategies.  SLP Short Term Goal 6 - Progress (Week 2): Met    New Short Term Goals: Week 3: SLP Short Term Goal 1 (Week 3): Pt will sustain attention to basic task for ~ 30 minutes with Min A cues.  SLP Short Term Goal 2 (Week 3): Pt will complete basic familiar problem solving tasks with Mod A cues.  SLP Short Term Goal 3 (Week 3): Pt will demonstrate intellectual awareness by identifying 1 physical and 1 cognitive deficit with Max A cues.  SLP Short Term Goal 4 (Week 3): Pt will consume regular diet with thin liquids with minimal s/s of aspiration and  supervision cues.   Weekly Progress Updates: Pt has made fair progress this reporting period as evidenced by meeting 6 of 6 STGs. As a result, she had supplemental feedings discharged and is strictly PO at this time. Pt with increase task initiation and attention to task. Skilled ST is required to target cognitive deficits and advance diet to increase functional independence and reduce caregiver burden.      Intensity: Minumum of 1-2 x/day, 30 to 90 minutes Frequency: 3 to 5 out of 7 days Duration/Length of Stay: 11/6 Treatment/Interventions: Cognitive remediation/compensation;Cueing hierarchy;Functional tasks;Patient/family education;Internal/external aids;Dysphagia/aspiration precaution training;Medication managment;Therapeutic Activities   Daily Session  Skilled Therapeutic Interventions: Skilled treatment session focused on cognition goals. SLP received pt sleeping in bed. Pt required total A for brief arousal. Pt with no awareness of deficits and therefore no corelation to therapy tasks. Pt able to follow 2 step simple directions while in bed. Pt left asleep in bed, bed alarm on and all needs within reach.      Function:   Cognition Comprehension Comprehension assist level: Follows basic conversation/direction with extra time/assistive device  Expression   Expression assist level: Expresses basic 90% of the time/requires cueing < 10% of the time.  Social Interaction Social Interaction assist level: Interacts appropriately 90% of the time - Needs monitoring or encouragement for participation or interaction.  Problem Solving Problem solving assist level:  Solves basic 75 - 89% of the time/requires cueing 10 - 24% of the time  Memory Memory assist level: Recognizes or recalls 90% of the time/requires cueing < 10% of the time   General    Pain    Therapy/Group: Individual Therapy  Suzanne Brooks 09/16/2017, 12:42 PM

## 2017-09-16 NOTE — Progress Notes (Signed)
Occupational Therapy Session Note  Patient Details  Name: Suzanne Brooks MRN: [id number] Date of Birth: [birth year]  Today's Date: 09/16/2017 OT Individual Time: 0830-1015 OT Individual Time Calculation (min): 105 min    Short Term Goals: Week 1:  OT Short Term Goal 1 (Week 1): Pt will sit EOB with min A during bathing task OT Short Term Goal 1 - Progress (Week 1): Met OT Short Term Goal 2 (Week 1): Pt will complete sit <> stand at sink with max A +1 in prep for functional task OT Short Term Goal 2 - Progress (Week 1): Met OT Short Term Goal 3 (Week 1): Pt will complete set-up of oral care task with no more than 1 VC for sequencing OT Short Term Goal 3 - Progress (Week 1): Met Week 2:  OT Short Term Goal 1 (Week 2): Pt will terminate bathing tasks with no more than 1 VC OT Short Term Goal 2 (Week 2): Pt will don B shoes with supervision OT Short Term Goal 3 (Week 2): Pt will maintain selective attention for LB dressing with no more than 1 VC OT Short Term Goal 4 (Week 2): Pt will transfer to tub/shower with touching A  Skilled Therapeutic Interventions/Progress Updates:    1:! Pt participated with showering sit to stand with distant supervision to mod I once transferred into the shower. Pt dressed sit to stand with extra time and steadying A. Pt declined to wear AFO today. Pt stood with supervision to brush teeth. Pt participated in therapeutic activity of handing out candy for treat or treating sit to stand with occasional steading A and standing endurance. Pt demonstrating appropriate social interaction. Pt returned to room and left resting in bed.   Therapy Documentation Precautions:  Precautions Precautions: Fall Restrictions Weight Bearing Restrictions: No Pain:   no c/o pain  See Function Navigator for Current Functional Status.   Therapy/Group: Individual Therapy  [facility] 09/16/2017, 11:23 AM

## 2017-09-16 NOTE — Progress Notes (Signed)
Physical Therapy Session Note  Patient Details  Name: Suzanne Brooks MRN: [id number] Date of Birth: 11-Dec-1975  Today's Date: 09/16/2017 PT Individual Time: 1303-1405 PT Individual Time Calculation (min): 62 min   Short Term Goals: Week 2:  PT Short Term Goal 1 (Week 2): Pt will consistant perform all trasnfers with min assist PT Short Term Goal 2 (Week 2): Pt will perform bed mobility with supervisoin assist  PT Short Term Goal 3 (Week 2): Pt will ambulate 12850ft with min assist  PT Short Term Goal 4 (Week 2): Pt will propell WC 1750ft with supervision assist consistantly   Skilled Therapeutic Interventions/Progress Updates: Pt presented in bed completing lunch with nsg present handing off to PTA. Pt donned socks and PTA assisted in donning shoes/AFO. Pt ambulated to day room no AD with min guard and no LOB. Pt participated in various activities while negotiating in highly distracting environment during holiday activities. Removed AFO due to poorly fitting with shoe and ambulated to rehab gym. Pt unable to elicit DF at this time and with decreased safety awareness while ambulated requiring PTA to provide moderate cues for foot clearance and  X 2 LOB. Discussed use of AFO upon d/c and pt verbalized understanding of improved safety and gait quality with use of AFO. Re-donned AFO and pt ambulated back to room at min guard level. Pt returned to bed at end of session with bed alarm on and call bell within reach.      Therapy Documentation Precautions:  Precautions Precautions: Fall Restrictions Weight Bearing Restrictions: No  See Function Navigator for Current Functional Status.   Therapy/Group: Individual Therapy  Laquia Rosano  Aubrielle Stroud, PTA  09/16/2017, 3:34 PM

## 2017-09-16 NOTE — Progress Notes (Signed)
Social Work Patient ID: Suzanne CatenaChristie Brooks, female   DOB: 21-Aug-1976, 41 y.o.   MRN: [id number]  Spoke with pt, Mom and daughter via telephone to inform of team conference progress in therapies and discharge date changing to 11/6 due to her progress. Mom and daughter to arrange for a ride home for her. Will work on discharge needs.

## 2017-09-16 NOTE — Progress Notes (Signed)
Subjective/Complaints: Patient in bed, tube removed yesterday, intake, improving. Discussed need for appetite stimulant ROS: pt denies nausea, vomiting, diarrhea, cough, shortness of breath or chest pain   Objective: Vital Signs: Blood pressure 128/74, pulse 71, temperature 98.5 F (36.9 C), temperature source Oral, resp. rate 18, height _0  (1.6 m), weight 61 kg (134 lb 7.7 oz), SpO2 98 %. No results found. No results found for this or any previous visit (from the past 72 hour(s)).   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 +NGT Cardio: RRR without murmur. No JVD   Resp: CTA Bilaterally without wheezes or rales. Normal effort   GI: BS positive, non tender Skin:   Other RIght upper chest wound from tunneled cath, CDI Neuro: fairly alert, delayed processing Nonverbal.  Motor 4+/5 B delt, bi, tri, grip  RLE: 3-/5 HF, KE Ankle PF, 0 ankle DF---unchanged Musc/Skel:  No edema. No tenderness. Gen NAD. Vital signs reviewed. Psych: Nonverbal.  Assessment/Plan: 1. Functional deficits secondary to Right periopthalmic SAH with cognitive deficits, Left frontal IPH and gait disorder,RLE >LLE weakness  which require 3+ hours per day of interdisciplinary therapy in a comprehensive inpatient rehab setting. Physiatrist is providing close team supervision and 24 hour management of active medical problems listed below. Physiatrist and rehab team continue to assess barriers to discharge/monitor patient progress toward functional and medical goals. FIM: Function - Bathing Position: Shower Body parts bathed by patient: Right arm, Left arm, Chest, Abdomen, Front perineal area, Buttocks, Right upper leg, Left upper leg, Left lower leg, Right lower leg, Back Body parts bathed by helper: Back Assist Level: More than reasonable time  Function- Upper Body Dressing/Undressing What is the patient wearing?: Hospital gown Pull over shirt/dress - Perfomed by patient:  Thread/unthread right sleeve, Thread/unthread left sleeve, Put head through opening, Pull shirt over trunk Pull over shirt/dress - Perfomed by helper: Put head through opening, Pull shirt over trunk Assist Level: Set up Function - Lower Body Dressing/Undressing What is the patient wearing?: Underwear, Pants, Non-skid slipper socks, Shoes Position: Wheelchair/chair at sink Underwear - Performed by patient: Thread/unthread right underwear leg, Thread/unthread left underwear leg, Pull underwear up/down Pants- Performed by patient: Thread/unthread right pants leg, Thread/unthread left pants leg, Pull pants up/down Pants- Performed by helper: Thread/unthread left pants leg, Pull pants up/down Non-skid slipper socks- Performed by patient: Don/doff right sock, Don/doff left sock Non-skid slipper socks- Performed by helper: Don/doff right sock, Don/doff left sock Shoes - Performed by patient: Don/doff right shoe, Don/doff left shoe, Fasten right, Fasten left Shoes - Performed by helper: Don/doff right shoe, Don/doff left shoe, Fasten right, Fasten left Assist for footwear: Supervision/touching assist Assist for lower body dressing: Supervision or verbal cues  Function - Toileting Toileting activity did not occur: Safety/medical concerns Toileting steps completed by patient: Adjust clothing prior to toileting, Performs perineal hygiene, Adjust clothing after toileting Toileting steps completed by helper: Adjust clothing prior to toileting, Performs perineal hygiene, Adjust clothing after toileting Toileting Assistive Devices: Grab bar or rail Assist level: Supervision or verbal cues  Function - Air cabin crew transfer activity did not occur: Safety/medical concerns Toilet transfer assistive device: Radio broadcast assistant lift: Stedy Assist level to toilet: Supervision or verbal cues Assist level from toilet: Supervision or verbal cues Assist level to bedside commode (at bedside): Touching  or steadying assistance (Pt > 75%) Assist level from bedside commode (at bedside): Touching or steadying assistance (Pt > 75%)  Function - Chair/bed transfer Chair/bed transfer method: Ambulatory Chair/bed  transfer assist level: Touching or steadying assistance (Pt > 75%) Chair/bed transfer assistive device: Armrests Chair/bed transfer details: Verbal cues for safe use of DME/AE  Function - Locomotion: Wheelchair Type: Manual Max wheelchair distance: 43f Assist Level: Touching or steadying assistance (Pt > 75%) Wheel 50 feet with 2 turns activity did not occur: Refused Assist Level: Touching or steadying assistance (Pt > 75%) Wheel 150 feet activity did not occur: Safety/medical concerns Turns around,maneuvers to table,bed, and toilet,negotiates 3% grade,maneuvers on rugs and over doorsills: No Function - Locomotion: Ambulation Ambulation activity did not occur: Safety/medical concerns Assistive device: Walker-rolling Max distance: 2027f Assist level: Touching or steadying assistance (Pt > 75%) Walk 10 feet activity did not occur: Safety/medical concerns Assist level: Supervision or verbal cues Assist level: Touching or steadying assistance (Pt > 75%) Walk 150 feet activity did not occur: Safety/medical concerns Assist level: Touching or steadying assistance (Pt > 75%) Walk 10 feet on uneven surfaces activity did not occur: Safety/medical concerns  Function - Comprehension Comprehension: Auditory Comprehension assist level: Follows basic conversation/direction with extra time/assistive device  Function - Expression Expression: Verbal Expression assist level: Expresses basic 90% of the time/requires cueing < 10% of the time.  Function - Social Interaction Social Interaction assist level: Interacts appropriately 90% of the time - Needs monitoring or encouragement for participation or interaction.  Function - Problem Solving Problem solving assist level: Solves basic 75 - 89%  of the time/requires cueing 10 - 24% of the time  Function - Memory Memory assist level: Recognizes or recalls 90% of the time/requires cueing < 10% of the time Patient normally able to recall (first 3 days only): That he or she is in a hospital  Medical Problem List and Plan: 1. 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 Formulized individual treatment plan based on medical history, underlying problem and comorbidities. 2. DVT Prophylaxis/Anticoagulation: SCDs 3. Pain Management: Tylenol as needed 4. Mood: Provide emotional support  More alert overall    5. Neuropsych: This patient is notcapable of making decisions on herown behalf.  Fall safety precautions---still with poor safety awareness  6. Skin/Wound Care: Routine skin checks 7. Fluids/Electrolytes/Nutrition: Routine I&Os, Cont megace 8.Seizure disorder.Keppra 1000 mg twice a day 9.Dysphagia. Dysphagia #2 thin liquids/nasogastric tube feeds to supplement---remove when able to adequately meet nutritional needs--follow daily I's and O's. 10.Hypertension.  -DBP elevated particularly. ---increased labetalol to 30052mID on 10/27 -check bp's q shift and with therapy.   -may need further titration, add amlodipine 2.5mg62m/29 Controlled 10/30   Vitals:   09/15/17 2009 09/16/17 0546  BP: (!) 152/97 128/74  Pulse: 74 71  Resp:  18  Temp:  98.5 F (36.9 C)  SpO2:  98%   11.Leukocytosis.   UTI  , s/p treatment will recheck CBC today    12. ABLA  Hb 8.4 on 10/17, check CBC today 10/31 Cont to monitor   LOS (Days) 15 A FACE TO FACE EVALUATION WAS PERFORMED  Suzanne Brooks E 09/16/2017, 12:08 PM

## 2017-09-17 ENCOUNTER — Inpatient Hospital Stay (HOSPITAL_COMMUNITY): Payer: Medicaid Other | Admitting: Speech Pathology

## 2017-09-17 ENCOUNTER — Inpatient Hospital Stay (HOSPITAL_COMMUNITY): Payer: Medicaid Other | Admitting: Occupational Therapy

## 2017-09-17 ENCOUNTER — Inpatient Hospital Stay (HOSPITAL_COMMUNITY): Payer: Medicaid Other | Admitting: Physical Therapy

## 2017-09-17 DIAGNOSIS — I610 Nontraumatic intracerebral hemorrhage in hemisphere, subcortical: Secondary | ICD-10-CM

## 2017-09-17 NOTE — Progress Notes (Signed)
Speech Language Pathology Daily Session Note  Patient Details  Name: Suzanne Brooks MRN: [id number] Date of Birth: 12-27-1975  Today's Date: 09/17/2017 SLP Individual Time: [id number]; [id number] SLP Individual Time Calculation (min): 25 min, 26 min  Short Term Goals: Week 3: SLP Short Term Goal 1 (Week 3): Pt will sustain attention to basic task for ~ 30 minutes with Min A cues.  SLP Short Term Goal 2 (Week 3): Pt will complete basic familiar problem solving tasks with Mod A cues.  SLP Short Term Goal 3 (Week 3): Pt will demonstrate intellectual awareness by identifying 1 physical and 1 cognitive deficit with Max A cues.  SLP Short Term Goal 4 (Week 3): Pt will consume regular diet with thin liquids with minimal s/s of aspiration and supervision cues.   Skilled Therapeutic Interventions:   Session 1:  Pt was seen for skilled ST targeting goals for cognition and dysphagia.  Pt was asleep upon arrival but responded quickly to environmental modifications to maximize alertness such as turning on lights and opening blinds to room.  Pt fed herself 100% of her breakfast tray with mod I use of swallowing precautions and no overt s/s of aspiration.  After completing breakfast, pt brushed her teeth with distant supervision for basic task organization.  Pt needed min verbal cues for safety awareness as she attempted to get out of bed to go the bathroom as therapist was leaving.  Pt was continent and returned to bed.  Pt was left with all needs within reach and bed alarm set.  Continue per current plan of care.   Session 2:  Pt was seen for skilled ST targeting cognitive goals.  SLP facilitated the session with a novel card game targeting problem solving and attention goals.  Pt sustained her attention to card game for its duration (~20 minutes) with no cues needed for redirection.  Pt was able to plan and execute a problem solving strategy with min assist verbal cues for working memory of task rules and  procedures.  Pt was left in bed with bed alarm set.  Telesitter in place.  Continue per current plan of care.       Function:  Eating Eating   Modified Consistency Diet: Yes Eating Assist Level: More than reasonable amount of time           Cognition Comprehension Comprehension assist level: Follows basic conversation/direction with extra time/assistive device  Expression   Expression assist level: Expresses basic 90% of the time/requires cueing < 10% of the time.  Social Interaction Social Interaction assist level: Interacts appropriately 90% of the time - Needs monitoring or encouragement for participation or interaction.  Problem Solving Problem solving assist level: Solves basic 75 - 89% of the time/requires cueing 10 - 24% of the time  Memory Memory assist level: Recognizes or recalls 75 - 89% of the time/requires cueing 10 - 24% of the time    Pain Pain Assessment Pain Assessment: No/denies pain  Therapy/Group: Individual Therapy  Suzanne Brooks, Suzanne Brooks 09/17/2017, 3:57 PM

## 2017-09-17 NOTE — Progress Notes (Signed)
Subjective/Complaints: Appetite improving ate >75% of tray ROS: pt denies nausea, vomiting, diarrhea, cough, shortness of breath or chest pain   Objective: Vital Signs: Blood pressure 131/83, pulse 82, temperature 98.7 F (37.1 C), temperature source Oral, resp. rate 17, height 5\' 3"  (1.6 m), weight 60.7 kg (133 lb 14.4 oz), SpO2 100 %. No results found. Results for orders placed or performed during the hospital encounter of 09/01/17 (from the past 72 hour(s))  CBC with Differential/Platelet     Status: Abnormal   Collection Time: 09/16/17 12:37 PM  Result Value Ref Range   WBC 11.3 (H) 4.0 - 10.5 K/uL   RBC 3.39 (L) 3.87 - 5.11 MIL/uL   Hemoglobin 9.1 (L) 12.0 - 15.0 g/dL   HCT 29.528.9 (L) 62.136.0 - 30.846.0 %   MCV 85.3 78.0 - 100.0 fL   MCH 26.8 26.0 - 34.0 pg   MCHC 31.5 30.0 - 36.0 g/dL   RDW 65.719.1 (H) 84.611.5 - 96.215.5 %   Platelets 307 150 - 400 K/uL   Neutrophils Relative % 68 %   Lymphocytes Relative 19 %   Monocytes Relative 9 %   Eosinophils Relative 4 %   Basophils Relative 0 %   Neutro Abs 7.7 1.7 - 7.7 K/uL   Lymphs Abs 2.1 0.7 - 4.0 K/uL   Monocytes Absolute 1.0 0.1 - 1.0 K/uL   Eosinophils Absolute 0.5 0.0 - 0.7 K/uL   Basophils Absolute 0.0 0.0 - 0.1 K/uL   RBC Morphology POLYCHROMASIA PRESENT      HEENT: Staples over ventriculostomy site and crani site, well healed. +NGT Cardio: RRR without murmur. No JVD   Resp: CTA Bilaterally without wheezes or rales. Normal effort   GI: BS positive, non tender Skin:   Other RIght upper chest wound from tunneled cath, CDI Neuro: fairly alert, delayed processing Nonverbal.  Motor 4+/5 B delt, bi, tri, grip  RLE: 3-/5 HF, KE Ankle PF, 0 ankle DF---unchanged Musc/Skel:  No edema. No tenderness. Gen NAD. Vital signs reviewed. Psych: Nonverbal.  Assessment/Plan: 1. Functional deficits secondary to Right periopthalmic SAH with cognitive deficits, Left frontal IPH and gait disorder,RLE >LLE weakness  which require 3+ hours per day of  interdisciplinary therapy in a comprehensive inpatient rehab setting. Physiatrist is providing close team supervision and 24 hour management of active medical problems listed below. Physiatrist and rehab team continue to assess barriers to discharge/monitor patient progress toward functional and medical goals. FIM: Function - Bathing Position: Shower Body parts bathed by patient: Right arm, Left arm, Chest, Abdomen, Front perineal area, Buttocks, Right upper leg, Left upper leg, Left lower leg, Right lower leg, Back Body parts bathed by helper: Back Assist Level: More than reasonable time  Function- Upper Body Dressing/Undressing What is the patient wearing?: Hospital gown Pull over shirt/dress - Perfomed by patient: Thread/unthread right sleeve, Thread/unthread left sleeve, Put head through opening, Pull shirt over trunk Pull over shirt/dress - Perfomed by helper: Put head through opening, Pull shirt over trunk Assist Level: Set up Function - Lower Body Dressing/Undressing What is the patient wearing?: Underwear, Pants, Non-skid slipper socks, Shoes Position: Wheelchair/chair at sink Underwear - Performed by patient: Thread/unthread right underwear leg, Thread/unthread left underwear leg, Pull underwear up/down Pants- Performed by patient: Thread/unthread right pants leg, Thread/unthread left pants leg, Pull pants up/down Pants- Performed by helper: Thread/unthread left pants leg, Pull pants up/down Non-skid slipper socks- Performed by patient: Don/doff right sock, Don/doff left sock Non-skid slipper socks- Performed by helper: Don/doff right sock, Don/doff left  sock Shoes - Performed by patient: Don/doff right shoe, Don/doff left shoe, Fasten right, Fasten left Shoes - Performed by helper: Don/doff right shoe, Don/doff left shoe, Fasten right, Fasten left Assist for footwear: Supervision/touching assist Assist for lower body dressing: Supervision or verbal cues  Function -  Toileting Toileting activity did not occur: Safety/medical concerns Toileting steps completed by patient: Adjust clothing prior to toileting, Performs perineal hygiene, Adjust clothing after toileting Toileting steps completed by helper: Adjust clothing prior to toileting, Performs perineal hygiene, Adjust clothing after toileting Toileting Assistive Devices: Grab bar or rail Assist level: Supervision or verbal cues  Function - Archivist transfer activity did not occur: Safety/medical concerns Toilet transfer assistive device: Grab bar Mechanical lift: Stedy Assist level to toilet: Supervision or verbal cues Assist level from toilet: Supervision or verbal cues Assist level to bedside commode (at bedside): Touching or steadying assistance (Pt > 75%) Assist level from bedside commode (at bedside): Touching or steadying assistance (Pt > 75%)  Function - Chair/bed transfer Chair/bed transfer method: Ambulatory Chair/bed transfer assist level: Touching or steadying assistance (Pt > 75%) Chair/bed transfer assistive device: Armrests Chair/bed transfer details: Verbal cues for safe use of DME/AE  Function - Locomotion: Wheelchair Type: Manual Max wheelchair distance: 10ft Assist Level: Touching or steadying assistance (Pt > 75%) Wheel 50 feet with 2 turns activity did not occur: Refused Assist Level: Touching or steadying assistance (Pt > 75%) Wheel 150 feet activity did not occur: Safety/medical concerns Turns around,maneuvers to table,bed, and toilet,negotiates 3% grade,maneuvers on rugs and over doorsills: No Function - Locomotion: Ambulation Ambulation activity did not occur: Safety/medical concerns Assistive device: Walker-rolling Max distance: 226ft  Assist level: Touching or steadying assistance (Pt > 75%) Walk 10 feet activity did not occur: Safety/medical concerns Assist level: Supervision or verbal cues Assist level: Touching or steadying assistance (Pt >  75%) Walk 150 feet activity did not occur: Safety/medical concerns Assist level: Touching or steadying assistance (Pt > 75%) Walk 10 feet on uneven surfaces activity did not occur: Safety/medical concerns  Function - Comprehension Comprehension: Auditory Comprehension assist level: Follows basic conversation/direction with extra time/assistive device  Function - Expression Expression: Verbal Expression assist level: Expresses basic 90% of the time/requires cueing < 10% of the time.  Function - Social Interaction Social Interaction assist level: Interacts appropriately 90% of the time - Needs monitoring or encouragement for participation or interaction.  Function - Problem Solving Problem solving assist level: Solves basic 75 - 89% of the time/requires cueing 10 - 24% of the time  Function - Memory Memory assist level: Recognizes or recalls 90% of the time/requires cueing < 10% of the time Patient normally able to recall (first 3 days only): That he or she is in a hospital  Medical Problem List and Plan: 1. 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 DVT Prophylaxis/Anticoagulation: SCDs 3. Pain Management: Tylenol as needed 4. Mood: Provide emotional support  More alert overall    5. Neuropsych: This patient is notcapable of making decisions on herown behalf.  Fall safety precautions---still with poor safety awareness  6. Skin/Wound Care: Routine skin checks 7. Fluids/Electrolytes/Nutrition: Routine I&Os, Cont megace consider d/c prior to discharge 8.Seizure disorder.Keppra 1000 mg twice a day 9.Dysphagia. Dysphagia #2 thin liquids/nasogastric tube feeds to supplement---remove when able to adequately meet nutritional needs--follow daily I's and O's. 10.Hypertension.  -DBP elevated particularly. ---  increased labetalol to 300mg  BID  on 10/27 -check bp's q shift and with therapy.   -may need further titration, add amlodipine 2.5mg  10/29 Controlled 11/1   Vitals:   09/16/17 2035 09/17/17 0524  BP: (!) 143/98 131/83  Pulse: 80 82  Resp:  17  Temp:  98.7 F (37.1 C)  SpO2:  100%   11.Leukocytosis.   UTI  ,improved  WBC  11.3 on 10/31 12. ABLA  Hb 8.4 on 10/17, improved to 9.1 on 10/31 Cont to monitor   LOS (Days) 16 A FACE TO FACE EVALUATION WAS PERFORMED  Lorea Kupfer E 09/17/2017, 8:47 AM

## 2017-09-17 NOTE — Progress Notes (Signed)
Physical Therapy Session Note  Patient Details  Name: Starlette Thurow MRN: [id number] Date of Birth: [birth year]  Today's Date: 09/17/2017 PT Individual Time: 8016-5537 AND 1600-1630 PT Individual Time Calculation (min): 60 min   Short Term Goals: Week 1:  PT Short Term Goal 1 (Week 1): Pt will transfers with moderate assist from PT  PT Short Term Goal 1 - Progress (Week 1): Met PT Short Term Goal 2 (Week 1): Pt will ambulate 66f with max assist  PT Short Term Goal 2 - Progress (Week 1): Met PT Short Term Goal 3 (Week 1): Pt will maintain standing balance for 1 minute with moderate assist  PT Short Term Goal 3 - Progress (Week 1): Met PT Short Term Goal 4 (Week 1): Pt will perform bed mobility with moderate assist  PT Short Term Goal 4 - Progress (Week 1): Met Week 2:  PT Short Term Goal 1 (Week 2): Pt will consistant perform all trasnfers with min assist PT Short Term Goal 2 (Week 2): Pt will perform bed mobility with supervisoin assist  PT Short Term Goal 3 (Week 2): Pt will ambulate 1543fwith min assist  PT Short Term Goal 4 (Week 2): Pt will propell WC 15078fith supervision assist consistantly   Skilled Therapeutic Interventions/Progress Updates:  Session 1 Pt received supine in bed and agreeable to PT. Supine>sit transfer with out cues or assist.   PT assisted pt to don shoes EOB. Gait training to day room x 100f38fth RW and supervision assist. Min cues for increased step height due to intermittent foot drag. PT assisted pt to done foot up brace to improve safety with gait prevent foot drag. Gait training with RW and foot up 2x 200ft42fpervision assist with significant improvement in gait pattern and safety. Additional gait training with foot up and no AD x 300ft 46f200ft, 36frvision assist overall. Pt noted to have multiple near LOB, but able to correct with stepping strategy; One instance of min assist to prevent fall.   Gait in room with supervision assist and no AD to use  restroom. Distant supervision for person hygiene following urination.   Pt returned to room and performed ambulatory transfer to bed with supervision assist. Sit>supine completed without assist, and pt left supine in bed with call bell in reach and all needs met.   Session 2.  Pt received supine in bed and agreeable to PT. Supine>sit transfer without or cues.  Gait training without AD or AFO to rehab gym with supervision assist. Pt had 4 near LOB, due to foot drag, but able to correct without extra assist from PT.   Supine NMR for RLE neuromotor control. SAQ AAROM DF, PF, inversion, and eversion. Pt demonstrates trace activaiton of anterior tib in the RLE.   Pt returned to room and performed ambulatory transfer to bed with supervision assist. Sit>supine completed without assist,  and  Pt left supine in bed with call bell in reach and all needs met.            Therapy Documentation Precautions:  Precautions Precautions: Fall Restrictions Weight Bearing Restrictions: No Pain: Pain Assessment Pain Assessment: No/denies pain   See Function Navigator for Current Functional Status.   Therapy/Group: Individual Therapy  Eyanna Mcgonagle Lorie [id number], 11:56 AM

## 2017-09-17 NOTE — Progress Notes (Signed)
Occupational Therapy Session Note  Patient Details  Name: [name] MRN: [id number] Date of Birth: [birth year]  Today's Date: 09/17/2017 OT Individual Time: [id number] OT Individual Time Calculation (min): 46 min    Short Term Goals: Week 2:  OT Short Term Goal 1 (Week 2): Pt will terminate bathing tasks with no more than 1 VC OT Short Term Goal 2 (Week 2): Pt will don B shoes with supervision OT Short Term Goal 3 (Week 2): Pt will maintain selective attention for LB dressing with no more than 1 VC OT Short Term Goal 4 (Week 2): Pt will transfer to tub/shower with touching A  Skilled Therapeutic Interventions/Progress Updates:    1:1 focus on self care retraining at shower level with supervision to distant supervision level. Min a for functional ambulation without an orthotic on right LE. Pt ambulated to the dayroom to participate in higher level cognitive task with min A with paper and pen. Return to room and left resting in bed.   Therapy Documentation Precautions:  Precautions Precautions: Fall Restrictions Weight Bearing Restrictions: No Pain:  no c/o pain   See Function Navigator for Current Functional Status.   Therapy/Group: Individual Therapy  Roney MansSmith, Norabelle Kondo [facility] 09/17/2017, 3:40 PM

## 2017-09-18 ENCOUNTER — Inpatient Hospital Stay (HOSPITAL_COMMUNITY): Payer: Medicaid Other | Admitting: Occupational Therapy

## 2017-09-18 ENCOUNTER — Inpatient Hospital Stay (HOSPITAL_COMMUNITY): Payer: Medicaid Other | Admitting: Physical Therapy

## 2017-09-18 ENCOUNTER — Inpatient Hospital Stay (HOSPITAL_COMMUNITY): Payer: Medicaid Other

## 2017-09-18 ENCOUNTER — Inpatient Hospital Stay (HOSPITAL_COMMUNITY): Payer: Medicaid Other | Admitting: Speech Pathology

## 2017-09-18 DIAGNOSIS — I69151 Hemiplegia and hemiparesis following nontraumatic intracerebral hemorrhage affecting right dominant side: Secondary | ICD-10-CM

## 2017-09-18 MED ORDER — AMLODIPINE BESYLATE 5 MG PO TABS
5.0000 mg | ORAL_TABLET | Freq: Every day | ORAL | Status: DC
  Administered 2017-09-19 – 2017-09-22 (×4): 5 mg via ORAL
  Filled 2017-09-18 (×4): qty 1

## 2017-09-18 NOTE — Progress Notes (Signed)
Occupational Therapy Session Note  Patient Details  Name: Suzanne CatenaChristie Bartek MRN: [id number] Date of Birth: 10-Feb-1976  Today's Date: 09/18/2017 OT Individual Time: 1530-1600 OT Individual Time Calculation (min): 30 min    Short Term Goals: Week 2:  OT Short Term Goal 1 (Week 2): Pt will terminate bathing tasks with no more than 1 VC OT Short Term Goal 2 (Week 2): Pt will don B shoes with supervision OT Short Term Goal 3 (Week 2): Pt will maintain selective attention for LB dressing with no more than 1 VC OT Short Term Goal 4 (Week 2): Pt will transfer to tub/shower with touching A  Skilled Therapeutic Interventions/Progress Updates:    1:1. No c/o pain. Pt dons B shoes and toe up brace with supervision. Pt ambulates without AD throughout session with supervision-MIN A for balance and VC for R foot clearance. Pt participates in simulated grocery store activity recalling/gathering 4/4 items without VC. Pt completes money management/makes change for all items with min VC and visual cues to recognize arithmetic mistakes. Exited session with pt seated EOB with call light in reach and bed exit alarm on.  Therapy Documentation Precautions:  Precautions Precautions: Fall Restrictions Weight Bearing Restrictions: No  See Function Navigator for Current Functional Status.   Therapy/Group: Individual Therapy  Shon HaleStephanie M Juliett Eastburn 09/18/2017, 4:04 PM

## 2017-09-18 NOTE — Progress Notes (Signed)
Subjective/Complaints: Didn't sleep well last night but couldn't give a good reason for this. No problems in therapy yesterday ROS: pt denies nausea, vomiting, diarrhea, cough, shortness of breath or chest pain   Objective: Vital Signs: Blood pressure (!) 134/92, pulse 82, temperature 98.6 F (37 C), temperature source Oral, resp. rate 16, height 5\' 3"  (1.6 m), weight 60.2 kg (132 lb 11.5 oz), SpO2 100 %. No results found. Results for orders placed or performed during the hospital encounter of 09/01/17 (from the past 72 hour(s))  CBC with Differential/Platelet     Status: Abnormal   Collection Time: 09/16/17 12:37 PM  Result Value Ref Range   WBC 11.3 (H) 4.0 - 10.5 K/uL   RBC 3.39 (L) 3.87 - 5.11 MIL/uL   Hemoglobin 9.1 (L) 12.0 - 15.0 g/dL   HCT 16.1 (L) 09.6 - 04.5 %   MCV 85.3 78.0 - 100.0 fL   MCH 26.8 26.0 - 34.0 pg   MCHC 31.5 30.0 - 36.0 g/dL   RDW 40.9 (H) 81.1 - 91.4 %   Platelets 307 150 - 400 K/uL   Neutrophils Relative % 68 %   Lymphocytes Relative 19 %   Monocytes Relative 9 %   Eosinophils Relative 4 %   Basophils Relative 0 %   Neutro Abs 7.7 1.7 - 7.7 K/uL   Lymphs Abs 2.1 0.7 - 4.0 K/uL   Monocytes Absolute 1.0 0.1 - 1.0 K/uL   Eosinophils Absolute 0.5 0.0 - 0.7 K/uL   Basophils Absolute 0.0 0.0 - 0.1 K/uL   RBC Morphology POLYCHROMASIA PRESENT      HEENT: Staples over ventriculostomy site and crani site, well healed. +NGT Cardio: RRR without murmur. No JVD   Resp: CTA Bilaterally without wheezes or rales. Normal effort   GI: BS positive, non tender Skin:   Other RIght upper chest wound from tunneled cath, CDI Neuro: fairly alert, delayed processing Nonverbal.  Motor 4+/5 B delt, bi, tri, grip  RLE: 3-/5 HF, KE Ankle PF, 0 ankle DF---unchanged Musc/Skel:  No edema. No tenderness. Gen NAD. Vital signs reviewed. Psych: Nonverbal.  Assessment/Plan: 1. Functional deficits secondary to Right periopthalmic SAH with cognitive deficits, Left frontal IPH  and gait disorder,RLE >LLE weakness  which require 3+ hours per day of interdisciplinary therapy in a comprehensive inpatient rehab setting. Physiatrist is providing close team supervision and 24 hour management of active medical problems listed below. Physiatrist and rehab team continue to assess barriers to discharge/monitor patient progress toward functional and medical goals. FIM: Function - Bathing Position: Shower Body parts bathed by patient: Right arm, Left arm, Chest, Abdomen, Front perineal area, Buttocks, Right upper leg, Left upper leg, Left lower leg, Right lower leg, Back Body parts bathed by helper: Back Assist Level: More than reasonable time  Function- Upper Body Dressing/Undressing What is the patient wearing?: Hospital gown Pull over shirt/dress - Perfomed by patient: Thread/unthread right sleeve, Thread/unthread left sleeve, Put head through opening, Pull shirt over trunk Pull over shirt/dress - Perfomed by helper: Put head through opening, Pull shirt over trunk Assist Level: Set up Function - Lower Body Dressing/Undressing What is the patient wearing?: Underwear, Pants, Non-skid slipper socks, Shoes Position: Wheelchair/chair at sink Underwear - Performed by patient: Thread/unthread right underwear leg, Thread/unthread left underwear leg, Pull underwear up/down Pants- Performed by patient: Thread/unthread right pants leg, Thread/unthread left pants leg, Pull pants up/down Pants- Performed by helper: Thread/unthread left pants leg, Pull pants up/down Non-skid slipper socks- Performed by patient: Don/doff right sock, Don/doff  left sock Non-skid slipper socks- Performed by helper: Don/doff right sock, Don/doff left sock Shoes - Performed by patient: Don/doff right shoe, Don/doff left shoe, Fasten right, Fasten left Shoes - Performed by helper: Don/doff right shoe, Don/doff left shoe, Fasten right, Fasten left Assist for footwear: Supervision/touching assist Assist for  lower body dressing: Supervision or verbal cues  Function - Toileting Toileting activity did not occur: Safety/medical concerns Toileting steps completed by patient: Adjust clothing prior to toileting, Performs perineal hygiene, Adjust clothing after toileting Toileting steps completed by helper: Adjust clothing prior to toileting, Performs perineal hygiene, Adjust clothing after toileting Toileting Assistive Devices: Grab bar or rail Assist level: Supervision or verbal cues  Function - Archivist transfer activity did not occur: Safety/medical concerns Toilet transfer assistive device: Grab bar Mechanical lift: Stedy Assist level to toilet: Supervision or verbal cues Assist level from toilet: Supervision or verbal cues Assist level to bedside commode (at bedside): Touching or steadying assistance (Pt > 75%) Assist level from bedside commode (at bedside): Touching or steadying assistance (Pt > 75%)  Function - Chair/bed transfer Chair/bed transfer method: Ambulatory Chair/bed transfer assist level: Touching or steadying assistance (Pt > 75%) Chair/bed transfer assistive device: Armrests Chair/bed transfer details: Verbal cues for safe use of DME/AE  Function - Locomotion: Wheelchair Type: Manual Max wheelchair distance: 26ft Assist Level: Touching or steadying assistance (Pt > 75%) Wheel 50 feet with 2 turns activity did not occur: Refused Assist Level: Touching or steadying assistance (Pt > 75%) Wheel 150 feet activity did not occur: Safety/medical concerns Turns around,maneuvers to table,bed, and toilet,negotiates 3% grade,maneuvers on rugs and over doorsills: No Function - Locomotion: Ambulation Ambulation activity did not occur: Safety/medical concerns Assistive device: Walker-rolling Max distance: 262ft  Assist level: Touching or steadying assistance (Pt > 75%) Walk 10 feet activity did not occur: Safety/medical concerns Assist level: Supervision or verbal  cues Assist level: Touching or steadying assistance (Pt > 75%) Walk 150 feet activity did not occur: Safety/medical concerns Assist level: Touching or steadying assistance (Pt > 75%) Walk 10 feet on uneven surfaces activity did not occur: Safety/medical concerns  Function - Comprehension Comprehension: Auditory Comprehension assist level: Follows basic conversation/direction with no assist  Function - Expression Expression: Verbal Expression assist level: Expresses basic 90% of the time/requires cueing < 10% of the time., Expresses basic needs/ideas: With extra time/assistive device  Function - Social Interaction Social Interaction assist level: Interacts appropriately 90% of the time - Needs monitoring or encouragement for participation or interaction.  Function - Problem Solving Problem solving assist level: Solves basic 75 - 89% of the time/requires cueing 10 - 24% of the time  Function - Memory Memory assist level: Recognizes or recalls 75 - 89% of the time/requires cueing 10 - 24% of the time Patient normally able to recall (first 3 days only): That he or she is in a hospital  Medical Problem List and Plan: 1. 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 DVT Prophylaxis/Anticoagulation: SCDs 3. Pain Management: Tylenol as needed 4. Mood: Provide emotional support  More alert overall    5. Neuropsych: This patient is notcapable of making decisions on herown behalf.  Fall safety precautions---still with poor safety awareness  6. Skin/Wound Care: Routine skin checks 7. Fluids/Electrolytes/Nutrition: Routine I&Os, Cont megace consider d/c prior to discharge, currently taking 50-75% meals, fluid intake, low yesterday at 320  mL 8.Seizure disorder.Keppra 1000 mg twice a day, no seizure since rehabilitation admission 9.Dysphagia. Dysphagia #2 thin  liquids/nasogastric tube feeds to supplement---remove when able to adequately meet nutritional needs--follow daily I's and O's. 10.Hypertension.  -DBP elevated particularly. ---increased labetalol to 300mg  BID on 10/27 -check bp's q shift and with therapy.   -may need further titration, add amlodipine 2.5mg  10/29 Still elevated 11/2, increase amlodipine 5 mg   Vitals:   09/17/17 2030 09/18/17 0515  BP: (!) 149/96 (!) 134/92  Pulse:  82  Resp:  16  Temp:  98.6 F (37 C)  SpO2:  100%   11.Leukocytosis.   UTI  ,improved  WBC  11.3 on 10/31 12. ABLA  Hb 8.4 on 10/17, improved to 9.1 on 10/31 Cont to monitor   LOS (Days) 17 A FACE TO FACE EVALUATION WAS PERFORMED  Claudette LawsKIRSTEINS,ANDREW E 09/18/2017, 12:59 PM

## 2017-09-18 NOTE — Progress Notes (Signed)
Speech Language Pathology Daily Session Note  Patient Details  Name: Suzanne CatenaChristie Brooks MRN: [id number] Date of Birth: 1976/03/04  Today's Date: 09/18/2017 SLP Individual Time: 0900-1000 SLP Individual Time Calculation (min): 60 min  Short Term Goals: Week 3: SLP Short Term Goal 1 (Week 3): Pt will sustain attention to basic task for ~ 30 minutes with Min A cues.  SLP Short Term Goal 2 (Week 3): Pt will complete basic familiar problem solving tasks with Mod A cues.  SLP Short Term Goal 3 (Week 3): Pt will demonstrate intellectual awareness by identifying 1 physical and 1 cognitive deficit with Max A cues.  SLP Short Term Goal 4 (Week 3): Pt will consume regular diet with thin liquids with minimal s/s of aspiration and supervision cues.   Skilled Therapeutic Interventions: Skilled treatment session focused on cognition goals. SLP facilitated session by providing Mod A cues to complete game of checkers with another pt. Pt with difficulties with sustained attention and recall of strategies/rules within game. Pt able to recall safety precautions within room (supervise with walking in room). Pt left in bed, bed alarm on and all needs within reach. Continue per current plan of care.      Function:   Cognition Comprehension Comprehension assist level: Follows basic conversation/direction with no assist  Expression   Expression assist level: Expresses basic 90% of the time/requires cueing < 10% of the time.;Expresses basic needs/ideas: With extra time/assistive device  Social Interaction    Problem Solving Problem solving assist level: Solves basic 75 - 89% of the time/requires cueing 10 - 24% of the time  Memory Memory assist level: Recognizes or recalls 75 - 89% of the time/requires cueing 10 - 24% of the time    Pain Pain Assessment Pain Assessment: No/denies pain  Therapy/Group: Individual Therapy  Suzanne Brooks 09/18/2017, 11:56 AM

## 2017-09-18 NOTE — Progress Notes (Signed)
Occupational Therapy Session Note  Patient Details  Name: Suzanne Brooks MRN: [id number] Date of Birth: 09-02-1976  Today's Date: 09/18/2017 OT Individual Time: 1000-1100 OT Individual Time Calculation (min): 60 min    Short Term Goals: Week 1:  OT Short Term Goal 1 (Week 1): Pt will sit EOB with min A during bathing task OT Short Term Goal 1 - Progress (Week 1): Met OT Short Term Goal 2 (Week 1): Pt will complete sit <> stand at sink with max A +1 in prep for functional task OT Short Term Goal 2 - Progress (Week 1): Met OT Short Term Goal 3 (Week 1): Pt will complete set-up of oral care task with no more than 1 VC for sequencing OT Short Term Goal 3 - Progress (Week 1): Met Week 2:  OT Short Term Goal 1 (Week 2): Pt will terminate bathing tasks with no more than 1 VC OT Short Term Goal 2 (Week 2): Pt will don B shoes with supervision OT Short Term Goal 3 (Week 2): Pt will maintain selective attention for LB dressing with no more than 1 VC OT Short Term Goal 4 (Week 2): Pt will transfer to tub/shower with touching A Week 3:     Skilled Therapeutic Interventions/Progress Updates:    1:1 Pt completed basic ADL tasks at supervision level for safety with functional ambulation around room. Pt continues to have delayed balance reactions and is a fall risk due to continued foot drop on right foot.  Pt donned foot upright brace for increased dorsiflexion with ambulation with dressing and does require A to don/ fasten clasp on brace. Pt ambulated to the dayroom with very close supervision. Pt participate in higher level balance activity on Kinetron in standing with and without UE support and Biodex with dynamic platform with extra time to complete tasks. Pt with improved standing balance and endurance.   Therapy Documentation Precautions:  Precautions Precautions: Fall Restrictions Weight Bearing Restrictions: No Pain: No reports of pain in session  See Function Navigator for Current  Functional Status.   Therapy/Group: Individual Therapy  Willeen [facility] 09/18/2017, 3:06 PM

## 2017-09-19 ENCOUNTER — Inpatient Hospital Stay (HOSPITAL_COMMUNITY): Payer: Medicaid Other | Admitting: Occupational Therapy

## 2017-09-19 ENCOUNTER — Inpatient Hospital Stay (HOSPITAL_COMMUNITY): Payer: Medicaid Other | Admitting: Physical Therapy

## 2017-09-19 NOTE — Progress Notes (Signed)
Physical Therapy Weekly Progress Note  Patient Details  Name: Suzanne Brooks MRN: [id number] Date of Birth: 08-01-1976  Beginning of progress report period: September 10, 2017 End of progress report period: September 19, 2017  Today's Date: 09/19/2017 PT Individual Time: 1400-1500   60 min  Patient has met 4 of 4 short term goals.  Pt is making steady progress towards all LTG. currently functioning at supervision assist level for all transfers and ambulation with foot up brace to prevent foot drop. Pt continues to required supervision assist for safety and problem solving in unfamiliar environment.    Patient continues to demonstrate the following deficits muscle weakness and muscle paralysis, abnormal tone and unbalanced muscle activation, decreased attention, decreased awareness, decreased problem solving, decreased safety awareness, decreased memory and delayed processing and decreased standing balance, hemiplegia and decreased balance strategies and therefore will continue to benefit from skilled PT intervention to increase functional independence with mobility.  Patient progressing toward long term goals..  Continue plan of care.  PT Short Term Goals Week 2:  PT Short Term Goal 1 (Week 2): Pt will consistant perform all trasnfers with min assist PT Short Term Goal 1 - Progress (Week 2): Met PT Short Term Goal 2 (Week 2): Pt will perform bed mobility with supervisoin assist  PT Short Term Goal 2 - Progress (Week 2): Met PT Short Term Goal 3 (Week 2): Pt will ambulate 181f with min assist  PT Short Term Goal 3 - Progress (Week 2): Met PT Short Term Goal 4 (Week 2): Pt will propell WC 1567fwith supervision assist consistantly  PT Short Term Goal 4 - Progress (Week 2): Met Week 3:  PT Short Term Goal 1 (Week 3): STG =LTG due to ELOS   Skilled Therapeutic Interventions/Progress Updates:   PT instructed pt in gait training through hospital with focus on problem solving to find unfamiliar  locations using signs in hospital. Pt attempted to find gift shop, main entrance, east wing, and cafeteria. supervision assist for safety throughout gait training as well as Moderate cues throughout gait training for use of signs in the hospital as well as to retain target location. Pt returned to room and left sitting EOB with bed alarm set and all needs met.      Therapy Documentation Precautions:  Precautions Precautions: Fall Restrictions Weight Bearing Restrictions: No Vital Signs: Therapy Vitals Temp: 98.4 F (36.9 C) Temp Source: Oral Pulse Rate: 86 Resp: 18 BP: 110/77 Patient Position (if appropriate): Sitting Oxygen Therapy SpO2: 100 % O2 Device: Not Delivered Pain: 0/10   See Function Navigator for Current Functional Status.  Therapy/Group: Individual Therapy  AuLorie Phenix1/01/2017, 5:38 PM

## 2017-09-19 NOTE — Progress Notes (Signed)
MD notified of patient's BP from 173/102 HR 75 to 180/90 HR 79. Patient is asymptomatic. Will monitor patient.

## 2017-09-19 NOTE — Progress Notes (Signed)
Occupational Therapy Session Note  Patient Details  Name: Suzanne Brooks MRN: [id number] Date of Birth: May 17, 1976  Today's Date: 09/19/2017 OT Individual Time: [id number] OT Individual Time Calculation (min): 55 min   Short Term Goals: Week 2:  OT Short Term Goal 1 (Week 2): Pt will terminate bathing tasks with no more than 1 VC OT Short Term Goal 2 (Week 2): Pt will don B shoes with supervision OT Short Term Goal 3 (Week 2): Pt will maintain selective attention for LB dressing with no more than 1 VC OT Short Term Goal 4 (Week 2): Pt will transfer to tub/shower with touching A     Skilled Therapeutic Interventions/Progress Updates:    Tx focus on balance, functional ambulation without device, and ADL retraining.   Pt greeted supine in bed, agreeable to shower. Pt ambulating to TTB in bathroom with close supervision. Doffed clothes in standing while using grab bars and support from wall without LOB. Suzanne Brooks showered with setup at sit<stand level with use of grab bars as needed. Afterwards pt required cues for donning safe footwear prior to exiting shower. Suzanne Brooks dressed EOB with supervision for sit<stand safety. Pt refusing to don Rt AFO due to it not fitting in shoe. Suzanne Brooks ambulated to therapy gym with close supervision without brace, recovering herself when Rt foot drop impeded balance. PT provided fitted AFO in gym. Pt able to don successfully with Min A and instruction. Suzanne Brooks then completed vacuuming in two small rooms. Pt bending out of base of support and stooping while plugging in device. Suzanne Brooks assisted with moving furniture without LOB (and min guard). At end of tx pt ambulated back to room and was set up for breakfast. Suzanne Brooks was left with all needs within reach and bed alarm activated.   Therapy Documentation Precautions:  Precautions Precautions: Fall Restrictions Weight Bearing Restrictions: No Vital Signs: Therapy Vitals Temp: 98.4 F (36.9 C) Temp Source: Oral Pulse Rate: 86 Resp: 18 BP:  110/77 Patient Position (if appropriate): Sitting Oxygen Therapy SpO2: 100 % O2 Device: Not Delivered Pain: No c/o pain during tx   ADL:      See Function Navigator for Current Functional Status.   Therapy/Group: Individual Therapy  Quida Glasser A Nahom Carfagno 09/19/2017, 4:19 PM

## 2017-09-19 NOTE — Progress Notes (Signed)
Physical Therapy Session Note  Patient Details  Name: Suzanne Brooks MRN: [id number] Date of Birth: June 18, 1976  Today's Date: 09/18/2017 PT Individual Time:1620-1700 40 min   Short Term Goals: Week 1:  PT Short Term Goal 1 (Week 1): Pt will transfers with moderate assist from PT  PT Short Term Goal 1 - Progress (Week 1): Met PT Short Term Goal 2 (Week 1): Pt will ambulate 41f with max assist  PT Short Term Goal 2 - Progress (Week 1): Met PT Short Term Goal 3 (Week 1): Pt will maintain standing balance for 1 minute with moderate assist  PT Short Term Goal 3 - Progress (Week 1): Met PT Short Term Goal 4 (Week 1): Pt will perform bed mobility with moderate assist  PT Short Term Goal 4 - Progress (Week 1): Met  Skilled Therapeutic Interventions/Progress Updates:   Pt received supine in bed and agreeable to PT. Supine>sit transfer with out assist or cues.   PT assisted Pt to don shoes and Foot up brace with education for proper technique to don brace.   Pt instructed pt in gait training 2028fx 2 with supervision assist. Intermittent foot drop causing near LOB, but pt able to maintain balance without increased assist form PT.   Pt instructed by PT in dynamic standing balance to play Wii fit balance games. Min assist from PT for improved weight shifting over the RLE.    Pt returned to room and performed ambulatory transfer to bed with supervision assist. Sit>supine completed without assist and left supine in bed with call bell in reach and all needs met.        Therapy Documentation Precautions:  Precautions Precautions: Fall Restrictions Weight Bearing Restrictions: No Vital Signs: Therapy Vitals Temp: 98.4 F (36.9 C) Temp Source: Oral Pulse Rate: 83 Resp: 16 BP: 135/88 Patient Position (if appropriate): Lying Oxygen Therapy SpO2: 100 % O2 Device: Not Delivered Pain: 0/10   See Function Navigator for Current Functional Status.   Therapy/Group: Individual  Therapy  AuLorie Phenix1/01/2017, 7:44 AM

## 2017-09-19 NOTE — Progress Notes (Signed)
Patient ID: Suzanne CatenaChristie Brooks, female   DOB: 09-Dec-1975, 41 y.o.   MRN: [id number]   09/19/2017.  Suzanne CatenaChristie Brooks is a 41 y.o. 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 Hypertension   . Seizures (HCC)       Subjective: No new complaints. No new problems.   Objective: Vital signs in last 24 hours: Temp:  [98.4 F (36.9 C)-98.8 F (37.1 C)] 98.4 F (36.9 C) (11/03 0345) Pulse Rate:  [75-86] 83 (11/03 0345) Resp:  [16-20] 16 (11/03 0345) BP: (129-180)/(86-102) 135/88 (11/03 0345) SpO2:  [100 %] 100 % (11/03 0345) Weight:  [131 lb 12.8 oz (59.8 kg)] 131 lb 12.8 oz (59.8 kg) (11/03 0345) Weight change: -14.7 oz (-0.416 kg) Last BM Date: 09/17/17 (Per pt. report)  Intake/Output from previous day: 11/02 0701 - 11/03 0700 In: 360 [P.O.:360] Out: -    Physical Exam General: No apparent distress   HEENT: not dry Lungs: Normal effort. Lungs clear to auscultation, no crackles or wheezes. Cardiovascular: Regular rate and rhythm, no edema; grade 3/6 systolic murmur Abdomen: S/NT/ND; BS(+) Musculoskeletal:  unchanged Neurological: No new neurological deficits Wounds: N/A    Extremities.  No edema Mental state: Alert, oriented, cooperative  BP Readings from Last 3 Encounters:  09/19/17 135/88  09/01/17 106/78    Lab Results: BMET    Component Value Date/Time   NA 138 09/02/2017 0538   K 4.0 09/02/2017 0538   CL 101 09/02/2017 0538   CO2 27 09/02/2017 0538   GLUCOSE 95 09/02/2017 0538   BUN 18 09/02/2017 0538   CREATININE 0.49 09/02/2017 0538   CALCIUM 9.9 09/02/2017 0538   GFRNONAA >60 09/02/2017 0538   GFRAA >60 09/02/2017 0538   CBC    Component Value Date/Time   WBC 11.3 (H) 09/16/2017 1237   RBC 3.39 (L) 09/16/2017 1237   HGB 9.1 (L) 09/16/2017 1237   HCT 28.9 (L) 09/16/2017 1237   PLT  307 09/16/2017 1237   MCV 85.3 09/16/2017 1237   MCH 26.8 09/16/2017 1237   MCHC 31.5 09/16/2017 1237   RDW 19.1 (H) 09/16/2017 1237   LYMPHSABS 2.1 09/16/2017 1237   MONOABS 1.0 09/16/2017 1237   EOSABS 0.5 09/16/2017 1237   BASOSABS 0.0 09/16/2017 1237    Medications: I have reviewed the patient's current medications.  Assessment/Plan:  Decreased functional mobility and cognitive deficits [facility] Left [facility] ICH with subsequent hydrocephalus and placement of the VPS           Continue CIR  PT, OT, SLP   Hypertension.  Presently well controlled Anemia.  Hemoglobin presently 9.1n October 31.  We'll recheck on Monday History of seizures.  Stable    Length of stay, days: 18  Rogelia BogaKWIATKOWSKI,Manus Weedman FRANK , MD 09/19/2017, 10:05 AM

## 2017-09-20 ENCOUNTER — Inpatient Hospital Stay (HOSPITAL_COMMUNITY): Payer: Medicaid Other | Admitting: Occupational Therapy

## 2017-09-20 NOTE — Progress Notes (Signed)
Occupational Therapy Weekly Progress Note  Patient Details  Name: Suzanne Brooks MRN: [id number] Date of Birth: 02/02/1976  Beginning of progress report period: September 11, 2017 End of progress report period: September 20, 2017  Today's Date: 09/20/2017 OT Individual Time: 0800-0911 OT Individual Time Calculation (min): 71 min    Patient has met 4 of 4 short term goals.  Pt is making steady progress towards goals.  Pt is ambulating at overall supervision level and is able to complete ADLs and supervision level with intermittent cues.  Pt continues to demonstrate mild RLE weakness and instability, will benefit from AFO - however pt reports AFO does not fit in shoe.  Pt with improved sequencing and initiation as well as improved attention.  Pt will continue to benefit from skilled OT to prepare for d/c home with supervision from family.    Patient continues to demonstrate the following deficits: muscle weakness, unbalanced muscle activation, ataxia and decreased coordination, decreased attention, decreased awareness, decreased problem solving, decreased safety awareness, decreased memory, decreased standing balance, decreased postural control, hemiplegia and decreased balance strategies.   and therefore will continue to benefit from skilled OT intervention to enhance overall performance with BADL and Reduce care partner burden.  Patient progressing toward long term goals..  Continue plan of care.  OT Short Term Goals Week 2:  OT Short Term Goal 1 (Week 2): Pt will terminate bathing tasks with no more than 1 VC OT Short Term Goal 1 - Progress (Week 2): Met OT Short Term Goal 2 (Week 2): Pt will don B shoes with supervision OT Short Term Goal 2 - Progress (Week 2): Met OT Short Term Goal 3 (Week 2): Pt will maintain selective attention for LB dressing with no more than 1 VC OT Short Term Goal 3 - Progress (Week 2): Met OT Short Term Goal 4 (Week 2): Pt will transfer to tub/shower with touching  A OT Short Term Goal 4 - Progress (Week 2): Met Week 3:  OT Short Term Goal 1 (Week 3): STG = LTGs due to remaining LOS  Skilled Therapeutic Interventions/Progress Updates:    Treatment session with focus on self-care retraining and dynamic balance.  Pt ambulated around room without AD or AFO to gather items prior to bathing in room shower.  Completed bathing at overall supervision level with increased time for thoroughness.  Dressing completed seated EOB, donning shoes with setup with pt reporting AFO does not fit in shoe.  Ambulated to ADL apt, completed tub/shower transfer with stepping over tub ledge with close supervision.  Returned to Dayroom and engaged in Biodex balance activities with focus on weight shifting and balance.  Provided tactile cues for weight shifting posterior during baseball catch activity on Biodex.  Pt returned to room and left seated EOB with water and bed alarm on.  Therapy Documentation Precautions:  Precautions Precautions: Fall Restrictions Weight Bearing Restrictions: No General:   Vital Signs: Therapy Vitals Temp: 99.5 F (37.5 C) Temp Source: Oral Pulse Rate: 76 Resp: 18 BP: 124/86 Patient Position (if appropriate): Lying Oxygen Therapy SpO2: 100 % O2 Device: Not Delivered Pain:  Pt with no c/o pain  See Function Navigator for Current Functional Status.   Therapy/Group: Individual Therapy  HOXIE, SARAH 09/20/2017, 8:51 AM   

## 2017-09-20 NOTE — Progress Notes (Signed)
Pt resting in bed quietly. Easily aroused. Denies any pain at this time. Ambulatory with slow, steady gait. Slight edema is noted to right parietal scalp. Able to make needs known. Refused ensure this shift and was educated that weights are monitored daily to ensure proper nutrition and body kilocalorie needs. callbell within reach. Safety maintained. Will continue to monitor.

## 2017-09-20 NOTE — Progress Notes (Signed)
Patient ID: Suzanne Brooks Trueheart, female   DOB: 10-Jun-1976, 41 y.o.   MRN: [id number]   09/20/17.  Suzanne Brooks Dillehay is a 41 y.o. female who is admitted for CIR with significant mobility and cognitive deficits following SAH with left frontal ICH and subsequent hydrocephalus.  She is status post placement of a VPS.  Past Medical History:  Diagnosis Date  . Hypertension   . Seizures (HCC)        Subjective: No new complaints. No new problems. Comfortable night  Objective: Vital signs in last 24 hours: Temp:  [98.4 F (36.9 C)-99.5 F (37.5 C)] 99.5 F (37.5 C) (11/04 0500) Pulse Rate:  [66-86] 76 (11/04 0500) Resp:  [18] 18 (11/04 0500) BP: (110-161)/(77-92) 124/86 (11/04 0500) SpO2:  [100 %] 100 % (11/04 0500) Weight:  [131 lb 13.4 oz (59.8 kg)] 131 lb 13.4 oz (59.8 kg) (11/04 0500) Weight change: 0.6 oz (0.016 kg) Last BM Date: 09/17/17  Intake/Output from previous day: 11/03 0701 - 11/04 0700 In: 360 [P.O.:360] Out: -    Physical Exam General: No apparent distress   HEENT: not dry Lungs: Normal effort. Lungs clear to auscultation, no crackles or wheezes. Cardiovascular: Regular rate and rhythm, no edema.  Grade 3/6 systolic murmur Abdomen: S/NT/ND; BS(+) Musculoskeletal:  unchanged Neurological: No new neurological deficits Wounds: N/A    Skin: clear  .  No rash Mental state: Alert, oriented, cooperative    Lab Results: BMET    Component Value Date/Time   NA 138 09/02/2017 0538   K 4.0 09/02/2017 0538   CL 101 09/02/2017 0538   CO2 27 09/02/2017 0538   GLUCOSE 95 09/02/2017 0538   BUN 18 09/02/2017 0538   CREATININE 0.49 09/02/2017 0538   CALCIUM 9.9 09/02/2017 0538   GFRNONAA >60 09/02/2017 0538   GFRAA >60 09/02/2017 0538   CBC    Component Value Date/Time   WBC 11.3 (H) 09/16/2017 1237   RBC 3.39 (L) 09/16/2017 1237   HGB 9.1 (L) 09/16/2017 1237   HCT 28.9 (L) 09/16/2017 1237   PLT 307 09/16/2017 1237   MCV 85.3 09/16/2017 1237   MCH 26.8 09/16/2017  1237   MCHC 31.5 09/16/2017 1237   RDW 19.1 (H) 09/16/2017 1237   LYMPHSABS 2.1 09/16/2017 1237   MONOABS 1.0 09/16/2017 1237   EOSABS 0.5 09/16/2017 1237   BASOSABS 0.0 09/16/2017 1237    Medications: I have reviewed the patient's current medications.  Assessment/Plan:  Functional mobility and cognitive [facility]  Status post placement of a VPS.  Continue CIR with PT and OT Hypertension.  Remains well controlled Anemia. Follow-up CBC in a.m. History of seizures, stable    Length of stay, days: 19  Rogelia BogaKWIATKOWSKI,Loye Vento FRANK , MD 09/20/2017, 8:38 AM

## 2017-09-21 ENCOUNTER — Inpatient Hospital Stay (HOSPITAL_COMMUNITY): Payer: Medicaid Other | Admitting: Occupational Therapy

## 2017-09-21 ENCOUNTER — Inpatient Hospital Stay (HOSPITAL_COMMUNITY): Payer: Medicaid Other | Admitting: Physical Therapy

## 2017-09-21 ENCOUNTER — Inpatient Hospital Stay (HOSPITAL_COMMUNITY): Payer: Medicaid Other | Admitting: Speech Pathology

## 2017-09-21 LAB — CBC
HEMATOCRIT: 29.3 % — AB (ref 36.0–46.0)
HEMOGLOBIN: 9.2 g/dL — AB (ref 12.0–15.0)
MCH: 26.8 pg (ref 26.0–34.0)
MCHC: 31.4 g/dL (ref 30.0–36.0)
MCV: 85.4 fL (ref 78.0–100.0)
Platelets: 244 10*3/uL (ref 150–400)
RBC: 3.43 MIL/uL — ABNORMAL LOW (ref 3.87–5.11)
RDW: 19.2 % — AB (ref 11.5–15.5)
WBC: 11.2 10*3/uL — ABNORMAL HIGH (ref 4.0–10.5)

## 2017-09-21 NOTE — Discharge Summary (Signed)
Discharge summary job [id number]

## 2017-09-21 NOTE — Discharge Summary (Signed)
NAMEnrigue Catena:  Nalepa, Cassadi              ACCOUNT NO.:  [id number]  MEDICAL RECORD NO.:  [id number]  LOCATION:  4W10C                        FACILITY:  [facility]  PHYSICIAN:  Erick ColaceAndrew E. Kirsteins, M.D.DATE OF BIRTH:  1976/09/19  DATE OF ADMISSION:  09/01/2017 DATE OF DISCHARGE:  09/22/2017                              DISCHARGE SUMMARY   DISCHARGE DIAGNOSES: 1. Subarachnoid hemorrhage, left frontal intracerebral hemorrhage with     subsequent hydrocephalus with placement of VPS. 2. SCDs for DVT prophylaxis. 3. Seizure disorder. 4. Dysphagia. 5. Hypertension. 6. Acute blood loss anemia.  This is a 41 year old right-handed female with history of hypertension as well as reported seizures, on no prescription medications at the time of admission.  Independent prior to admission.  Presented on July 30, 2017, with seizure requiring intubation.  Cranial CT scan showed diffuse subarachnoid hemorrhage.  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 Conchita ParisNundkumar. Followup serial CT scan showed progressive ventriculomegaly.  Required right frontal ventriculostomy on July 31, 2017.  Continuous EEG showed generalized polymorphic delta slowing suggesting severe encephalopathy, no seizure.  Maintained on Keppra for seizure prophylaxis.  The patient with persistent leukocytosis as well as fever. Infectious Disease consulted.  Maintained on broad-spectrum antibiotics. CSF cultures completed, remained negative.  Again, followup scan showed progressive hydrocephalus with laparoscopic-assisted ventriculoperitoneal shunt placement on August 27, 2017.  Dysphagia #2 thin liquid diet initially with nasogastric tube feeds.  Physical and occupational therapy ongoing.  The patient was admitted for comprehensive rehab program.  PAST MEDICAL HISTORY:  See discharge diagnoses.  SOCIAL HISTORY:  Independent prior to admission.  Multiple family, with good  support.  FUNCTIONAL STATUS UPON ADMISSION TO REHAB SERVICES:  +2 physical assist sit to stand, total assist stand pivot transfers, mod to max assist activities of daily living.  PHYSICAL EXAMINATION:  VITAL SIGNS:  Blood pressure 127/90, pulse 95, temperature 98, and respirations 18. GENERAL:  This was an alert female.  She was somewhat disengaged. HEENT:  VP craniotomy site clean and dry.  Nasogastric tube in place. Pupils sluggish, reactive to light. CARDIAC:  Rate controlled. ABDOMEN:  Soft, nontender.  Good bowel sounds. LUNGS:  Clear to auscultation without wheeze.  REHABILITATION HOSPITAL COURSE:  The patient was admitted to Inpatient Rehab Services with therapies initiated on a 3-hour daily basis, consisting of physical therapy, occupational therapy, speech therapy, and rehabilitation nursing.  The following issues were addressed during the patient's rehab stay.  Pertaining to Ms. Freels's SAH, left frontal ICH, subsequent hydrocephalus, placement of BPH shunt, she continued to progress nicely.  She would follow up with Neurosurgery.  Blood pressures remained well controlled, on Norvasc.  Keppra ongoing for seizure prophylaxis, no seizure activity.  Her diet was advanced to a mechanical soft.  Bouts of leukocytosis greatly improved.  She remained afebrile.  CSF cultures negative.  Acute blood loss anemia, stable, 9.1, no other bleeding episodes.  The patient received weekly collaborative interdisciplinary team conferences to discuss estimated length of stay, family teaching, any barriers to her discharge.  She could ambulate through the hospital with focus on problem solving, to find unfamiliar locations using signs in the hospital.  She did attempt to find  a gift shop in main entrance, but needed supervision assistance, working with ongoing problem solving, safety awareness, any decreased memory and delayed in processing.  Activities of daily living and homemaking.  She can  put on her clothes in standing position, shower with simple setup, again needing supervision for safety.  She was fitted with a right AFO brace, but needed some encouragement to wear her brace.  Speech Therapy continued to work with cognition.  She could communicate her needs. Needed some assistance to complete simple board games, working on sustained attention and recall.  All issues discussed with family, the need for supervision for her safety.  She was discharged to home.  DISCHARGE MEDICATIONS:  Included: 1. Norvasc 5 mg p.o. daily. 2. Labetalol 300 mg p.o. b.i.d. 3. Keppra 500 mg p.o. b.i.d. 4. Multivitamin daily. 5. Zantac 150 mg p.o. b.i.d.  DIET:  Her diet was mechanical soft.  FOLLOWUP:  She would follow up with [facility]; Dr. Conchita Paris, Neurosurgery, call for appointment.  SPECIAL INSTRUCTIONS:  No driving.  Supervision for safety.     Mariam Dollar, P.A.   ______________________________ Erick Colace, M.D.    DA/MEDQ  D:  09/21/2017  T:  09/21/2017  Job:  [id number]  cc:   Erick Colace, M.D. Dr. Conchita Paris

## 2017-09-21 NOTE — Progress Notes (Signed)
Occupational Therapy Discharge Summary  Patient Details  Name: Suzanne Brooks MRN: [id number] Date of Birth: [birth year]  Today's Date: 09/21/2017 OT Individual Time: 1102-1200 OT Individual Time Calculation (min): 58 min    Patient has met 10 of 10 long term goals due to improved activity tolerance, improved balance, ability to compensate for deficits, improved attention and improved awareness.  Patient to discharge at overall Supervision level.  Pt's caregivers offered family education but did not attend.  Reasons goals not met: all goals met  Recommendation:  Patient will benefit from ongoing skilled OT services in home health setting to continue to advance functional skills in the area of BADL and iADL.  Equipment: shower seat  Reasons for discharge: treatment goals met  Patient/family agrees with progress made and goals achieved: Yes   OT Intervention: Upon entering the room, pt supine in bed awaiting therapist with no c/o pain this session. Pt agreeable to OT intervention. Pt ambulating without use of AD to obtain clothing items needed for shower. Pt seated on shower seat for bathing at shower level at overall supervision. Pt donning clothing items while seated on edge of shower seat with supervision and min verbal cues for safety. Pt ambulating to sit on EOB to don B socks, shoes, and R LE brace with overall supervision as well. Pt standing st sink for grooming tasks and ambulating 200' with overall supervision as well. Pt returning to room and packing belonging into bags while reaching out of base of support with no LOB. Pt seated on EOB at end of session with bed alarm activated and call bell within reach upon exiting the room.   OT Discharge Precautions/Restrictions  Precautions Precautions: Fall General   Vital Signs Therapy Vitals Temp: 98.1 F (36.7 C) Temp Source: Oral Pulse Rate: 71 Resp: 17 BP: (!) 144/97 Patient Position (if appropriate): Lying Oxygen  Therapy SpO2: 100 % O2 Device: Not Delivered Pain Pain Assessment Pain Assessment: No/denies pain ADL   Vision Baseline Vision/History: Wears glasses Patient Visual Report: No change from baseline Perception    Praxis   Cognition Overall Cognitive Status: Impaired/Different from baseline Arousal/Alertness: Awake/alert Orientation Level: Oriented X4 Attention: Sustained Focused Attention: Appears intact Sustained Attention: Impaired Sustained Attention Impairment: Verbal complex;Functional complex Memory Impairment: Storage deficit;Retrieval deficit;Decreased short term memory Decreased Short Term Memory: Verbal basic;Functional basic Awareness: Impaired Awareness Impairment: Intellectual impairment Problem Solving: Impaired Problem Solving Impairment: Verbal complex;Functional complex Behaviors: Restless Safety/Judgment: Impaired Comments: Decreased awareness of deficits Sensation   Motor  Motor Motor: Hemiplegia;Motor impersistence;Abnormal postural alignment and control Mobility  Bed Mobility Bed Mobility: Rolling Right;Rolling Left;Supine to Sit;Sit to Supine Rolling Right: 6: Modified independent (Device/Increase time) Rolling Left: 6: Modified independent (Device/Increase time) Supine to Sit: 6: Modified independent (Device/Increase time) Sit to Supine: 6: Modified independent (Device/Increase time) Transfers Sit to Stand: 5: Supervision  Trunk/Postural Assessment  Cervical Assessment Cervical Assessment: Within Functional Limits Thoracic Assessment Thoracic Assessment: Exceptions to WFL(kyphotic) Lumbar Assessment Lumbar Assessment: Exceptions to WFL(posterior pelvic tilt) Postural Control Postural Control: Within Functional Limits  Balance Balance Balance Assessed: Yes Static Sitting Balance Static Sitting - Balance Support: Feet supported Static Sitting - Level of Assistance: 7: Independent Dynamic Sitting Balance Dynamic Sitting - Balance  Support: Feet supported;During functional activity Dynamic Sitting - Level of Assistance: 6: Modified independent (Device/Increase time) Sitting balance - Comments: Sitting EOB Static Standing Balance Static Standing - Balance Support: No upper extremity supported;During functional activity Static Standing - Level of Assistance: 6: Modified independent (Device/Increase time) Dynamic  Standing Balance Dynamic Standing - Balance Support: During functional activity;No upper extremity supported Dynamic Standing - Level of Assistance: 5: Stand by assistance Extremity/Trunk Assessment RUE Assessment RUE Assessment: Within Functional Limits LUE Assessment LUE Assessment: Within Functional Limits   See Function Navigator for Current Functional Status.  Darleen Crocker P 09/21/2017, 4:40 PM

## 2017-09-21 NOTE — Progress Notes (Signed)
Physical Therapy Session Note  Patient Details  Name: Tanisia Yokley MRN: [id number] Date of Birth: 1976/11/08  Today's Date: 09/21/2017 PT Individual Time: 0915-1000 and 1445-1515 PT Individual Time Calculation (min): 45 min and 30 min  Short Term Goals: Week 3:  PT Short Term Goal 1 (Week 3): STG =LTG due to ELOS   Skilled Therapeutic Interventions/Progress Updates: Tx1: Pt presented in bed agreeable to therapy. Pt donned socks and shoes with set up and pt present with toe lifter and was able to don with min cues for placement. Pt ambulated around unit with min cues for R foot clearance. Pt with x 3 occurrences of foot catching but able to recover without assist.  Performed functional activities including car transfer, stairs x 12, bed mobility, gait on uneven surfaces at supervision or mod I level (see function). Discussed use of toe lifter for safety when ambulating for best safety. Pt participated in Biodex catch game with mod challenges for balance. Pt returned to room and used toilet at supervision level. Pt returned to bed at end of session with call bell within reach and needs met.   Tx2: Pt presented sitting EOB agreeable to therapy. Session focused on activity tolerance and dynamic balance with use of Wii. Pt ambulated to rehab gym and participated in "Just Dance" game on Wii with pt incorporating UE and BLE with activity. Pt limited in reaching fully putside BOS however was able to keep pace with game and obtain high score. Pt able to play several full songs with brief seated rest between bouts. Upon completion pt returned to room, used bathroom with supervision, and returned to bed. Pt left sitting in bed with bed alarm on and needs met.      Therapy Documentation Precautions:  Precautions Precautions: Fall Restrictions Weight Bearing Restrictions: No General:   Vital Signs: Therapy Vitals Pulse Rate: 91 BP: 124/84 Pain:   Mobility: Bed Mobility Bed Mobility: Rolling  Right;Rolling Left;Supine to Sit;Sit to Supine Rolling Right: 6: Modified independent (Device/Increase time) Rolling Left: 6: Modified independent (Device/Increase time) Supine to Sit: 6: Modified independent (Device/Increase time) Sit to Supine: 6: Modified independent (Device/Increase time) Transfers Sit to Stand: 5: Supervision Locomotion : Ambulation Ambulation: Yes Ambulation/Gait Assistance: 5: Supervision Ambulation Distance (Feet): 500 Feet Gait Gait: Yes Gait Pattern: Impaired Gait Pattern: Right foot flat;Poor foot clearance - right Stairs / Additional Locomotion Stairs: Yes Stairs Assistance: 5: Supervision Stair Management Technique: Two rails Number of Stairs: 12  Trunk/Postural Assessment : Cervical Assessment Cervical Assessment: Within Functional Limits Thoracic Assessment Thoracic Assessment: Exceptions to WFL(kyphotic) Lumbar Assessment Lumbar Assessment: Exceptions to WFL(posterior pelvic tilt) Postural Control Postural Control: Within Functional Limits  Balance: Balance Balance Assessed: Yes Static Sitting Balance Static Sitting - Balance Support: Feet supported Static Sitting - Level of Assistance: 7: Independent Dynamic Sitting Balance Dynamic Sitting - Balance Support: Feet supported;During functional activity Dynamic Sitting - Level of Assistance: 6: Modified independent (Device/Increase time) Sitting balance - Comments: Sitting EOB Static Standing Balance Static Standing - Balance Support: No upper extremity supported;During functional activity Static Standing - Level of Assistance: 6: Modified independent (Device/Increase time) Dynamic Standing Balance Dynamic Standing - Balance Support: During functional activity;No upper extremity supported Dynamic Standing - Level of Assistance: 5: Stand by assistance Exercises:   Other Treatments:     See Function Navigator for Current Functional Status.   Therapy/Group: Individual Therapy  Coralynn Gaona  Marthella Osorno, PTA  09/21/2017, 12:20 PM

## 2017-09-21 NOTE — Progress Notes (Signed)
Speech Language Pathology Discharge Summary  Patient Details  Name: Suzanne Brooks MRN: [id number] Date of Birth: Jul 17, 1976  Today's Date: 09/21/2017 SLP Individual Time: 0800-0900 SLP Individual Time Calculation (min): 60 min   Skilled Therapeutic Interventions:  Skilled treatment session focused on cognition goals. SLP facilitated session by administering [place] version 7.1.Pt obtained a score of 21 out of 30 with n=> 26. Pt with continued deficits in recall of new information, intellectual awareness, safety awareness, sustained attention and mildly complex problem solving. Discharge plan reviewed with pt for need of 24 hour supervision and pt plans to discharge to her mother's home.     Patient has met 8 of 8 long term goals.  Patient to discharge at overall Min;Supervision level.    Clinical Impression/Discharge Summary:   Pt has made progress in skilled ST sessions as a result she has met 8 of 8 STGs. Pt is discharging at an overall support level of Min A to supervision level of support for cognitive deficits in the areas of sustained attention, increased processing time, completion of mildly complex problems, awareness and she is also discharging on dysphagia 3 diet. Pt would be appropriate for upgrade to regular in home environment as diet was initially downgraded d/t cognitive deficits. Recommend 24 hour supervision and HHST.   Care Partner:  Caregiver Able to Provide Assistance: Yes  Type of Caregiver Assistance: Cognitive  Recommendation:  24 hour supervision/assistance;Home Health SLP  Rationale for SLP Follow Up: Maximize cognitive function and independence;Maximize swallowing safety;Reduce caregiver burden   Equipment:     Reasons for discharge: Treatment goals met   Patient/Family Agrees with Progress Made and Goals Achieved: Yes   Function:  Eating Eating   Modified Consistency Diet: No Eating Assist Level: More than reasonable amount of time;Set up assist  for;Supervision or verbal cues           Cognition Comprehension Comprehension assist level: Follows basic conversation/direction with extra time/assistive device  Expression   Expression assist level: Expresses basic 90% of the time/requires cueing < 10% of the time.  Social Interaction Social Interaction assist level: Interacts appropriately 90% of the time - Needs monitoring or encouragement for participation or interaction.  Problem Solving Problem solving assist level: Solves basic 75 - 89% of the time/requires cueing 10 - 24% of the time  Memory Memory assist level: Recognizes or recalls 75 - 89% of the time/requires cueing 10 - 24% of the time   Boen Sterbenz 09/21/2017, 8:40 AM

## 2017-09-21 NOTE — Discharge Instructions (Signed)
Inpatient Rehab Discharge Instructions  Suzanne CatenaChristie Dubin Discharge date and time: No discharge date for patient encounter.   Activities/Precautions/ Functional Status: Activity: activity as tolerated Diet: soft Wound Care: none needed Functional status:  ___ No restrictions     ___ Walk up steps independently ___ 24/7 supervision/assistance   ___ Walk up steps with assistance ___ Intermittent supervision/assistance  ___ Bathe/dress independently ___ Walk with walker     _x__ Bathe/dress with assistance ___ Walk Independently    ___ Shower independently ___ Walk with assistance    ___ Shower with assistance ___ No alcohol     ___ Return to work/school ________  Special Instructions:    My questions have been answered and I understand these instructions. I will adhere to these goals and the provided educational materials after my discharge from the hospital.  Patient/Caregiver Signature _______________________________ Date __________  Clinician Signature _______________________________________ Date __________  Please bring this form and your medication list with you to all your follow-up doctor's appointments. COMMUNITY REFERRALS UPON DISCHARGE:    Home Health:   PT, OT, SP  Agency:[facility] [phone]   Date of last service:09/22/2017  Medical Equipment/Items Ordered:TUB SEAT  Agency/Supplier:[facility]   [phone]   GENERAL COMMUNITY RESOURCES FOR PATIENT/FAMILY: Support Groups:[facility] SUPPORT GROUP EVERY SECOND Tuesday @ 7:30-8:30 PM ON THE REHAB UNIT QUESTIONS CONTACT LUCY [phone]

## 2017-09-21 NOTE — Progress Notes (Signed)
Subjective/Complaints: Eating breakfast with gusto. Awake and alert ROS: pt denies nausea, vomiting, diarrhea, cough, shortness of breath or chest pain   Objective: Vital Signs: Blood pressure (!) 155/101, pulse 72, temperature 98.5 F (36.9 C), temperature source Oral, resp. rate 18, height 5\' 3"  (1.6 m), weight 59 kg (130 lb 1.6 oz), SpO2 99 %. No results found. No results found for this or any previous visit (from the past 72 hour(s)).   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 +NGT Cardio: RRR without murmur. No JVD   Resp: CTA Bilaterally without wheezes or rales. Normal effort   GI: BS positive, non tender Skin:   Other RIght upper chest wound from tunneled cath, CDI Neuro: fairly alert, delayed processing Nonverbal.  Motor 4+/5 B delt, bi, tri, grip  RLE: 3-/5 HF, KE Ankle PF, 0 ankle DF---unchanged Musc/Skel:  No edema. No tenderness. Gen NAD. Vital signs reviewed. Psych: Nonverbal.  Assessment/Plan: 1. Functional deficits secondary to Right periopthalmic SAH with cognitive deficits, Left frontal IPH and gait disorder,RLE >LLE weakness  which require 3+ hours per day of interdisciplinary therapy in a comprehensive inpatient rehab setting. Physiatrist is providing close team supervision and 24 hour management of active medical problems listed below. Physiatrist and rehab team continue to assess barriers to discharge/monitor patient progress toward functional and medical goals. FIM: Function - Bathing Position: Shower Body parts bathed by patient: Right arm, Left arm, Chest, Abdomen, Front perineal area, Buttocks, Right upper leg, Left upper leg, Left lower leg, Right lower leg, Back Body parts bathed by helper: Back Assist Level: Set up  Function- Upper Body Dressing/Undressing What is the patient wearing?: Pull over shirt/dress Pull over shirt/dress - Perfomed by patient: Thread/unthread right sleeve, Thread/unthread left sleeve, Put head  through opening, Pull shirt over trunk Pull over shirt/dress - Perfomed by helper: Put head through opening, Pull shirt over trunk Assist Level: Set up Function - Lower Body Dressing/Undressing What is the patient wearing?: Underwear, Pants, Non-skid slipper socks, Shoes, Socks Position: Sitting EOB Underwear - Performed by patient: Thread/unthread right underwear leg, Thread/unthread left underwear leg, Pull underwear up/down Pants- Performed by patient: Thread/unthread right pants leg, Thread/unthread left pants leg, Pull pants up/down Pants- Performed by helper: Thread/unthread left pants leg, Pull pants up/down Non-skid slipper socks- Performed by patient: Don/doff right sock, Don/doff left sock Non-skid slipper socks- Performed by helper: Don/doff right sock, Don/doff left sock Socks - Performed by patient: Don/doff right sock, Don/doff left sock Shoes - Performed by patient: Don/doff right shoe, Don/doff left shoe, Fasten right, Fasten left Shoes - Performed by helper: Don/doff right shoe, Don/doff left shoe, Fasten right, Fasten left AFO - Performed by patient: Don/doff right AFO AFO - Performed by helper: Don/doff right AFO Assist for footwear: Setup Assist for lower body dressing: Supervision or verbal cues  Function - Toileting Toileting activity did not occur: Safety/medical concerns Toileting steps completed by patient: Adjust clothing prior to toileting, Performs perineal hygiene, Adjust clothing after toileting Toileting steps completed by helper: Adjust clothing prior to toileting, Performs perineal hygiene, Adjust clothing after toileting Toileting Assistive Devices: Grab bar or rail Assist level: Supervision or verbal cues  Function - Archivist transfer activity did not occur: Safety/medical concerns Toilet transfer assistive device: Engineer, civil (consulting) lift: Stedy Assist level to toilet: Supervision or verbal cues Assist level from toilet: Supervision or  verbal cues Assist level to bedside commode (at bedside): Touching or steadying assistance (Pt > 75%) Assist level from bedside  commode (at bedside): Touching or steadying assistance (Pt > 75%)  Function - Chair/bed transfer Chair/bed transfer method: Ambulatory Chair/bed transfer assist level: Touching or steadying assistance (Pt > 75%) Chair/bed transfer assistive device: Armrests Chair/bed transfer details: Verbal cues for safe use of DME/AE  Function - Locomotion: Wheelchair Type: Manual Max wheelchair distance: 7675ft Assist Level: Touching or steadying assistance (Pt > 75%) Wheel 50 feet with 2 turns activity did not occur: Refused Assist Level: Touching or steadying assistance (Pt > 75%) Wheel 150 feet activity did not occur: Safety/medical concerns Turns around,maneuvers to table,bed, and toilet,negotiates 3% grade,maneuvers on rugs and over doorsills: No Function - Locomotion: Ambulation Ambulation activity did not occur: Safety/medical concerns Assistive device: Walker-rolling Max distance: 26100ft  Assist level: Touching or steadying assistance (Pt > 75%) Walk 10 feet activity did not occur: Safety/medical concerns Assist level: Supervision or verbal cues Assist level: Touching or steadying assistance (Pt > 75%) Walk 150 feet activity did not occur: Safety/medical concerns Assist level: Touching or steadying assistance (Pt > 75%) Walk 10 feet on uneven surfaces activity did not occur: Safety/medical concerns  Function - Comprehension Comprehension: Auditory Comprehension assist level: Follows basic conversation/direction with extra time/assistive device  Function - Expression Expression: Verbal Expression assist level: Expresses basic 90% of the time/requires cueing < 10% of the time.  Function - Social Interaction Social Interaction assist level: Interacts appropriately 90% of the time - Needs monitoring or encouragement for participation or interaction.  Function -  Problem Solving Problem solving assist level: Solves basic 75 - 89% of the time/requires cueing 10 - 24% of the time  Function - Memory Memory assist level: Recognizes or recalls 75 - 89% of the time/requires cueing 10 - 24% of the time Patient normally able to recall (first 3 days only): That he or she is in a hospital  Medical Problem List and Plan: 1. 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 DVT Prophylaxis/Anticoagulation: SCDs 3. Pain Management: Tylenol as needed 4. Mood: Provide emotional support  More alert overall    5. Neuropsych: This patient is notcapable of making decisions on herown behalf.  Fall safety precautions---still with poor safety awareness  6. Skin/Wound Care: Routine skin checks 7. Fluids/Electrolytes/Nutrition: Routine I&Os,D/C megace, appetite improved 8.Seizure disorder.Keppra 1000 mg twice a day, no seizure since rehabilitation admission 9.Dysphagia. Dysphagia #2 thin liquids/nasogastric tube feeds to supplement---remove when able to adequately meet nutritional needs--follow daily I's and O's. 10.Hypertension.  -DBP elevated particularly. ---increased labetalol to 300mg  BID on 10/27 -check bp's q shift and with therapy.   -may need further titration, add amlodipine 2.5mg  10/29 Still elevated 11/5, increased amlodipine to 5 mg on 11/3, will need PCP f/u after d/c   Vitals:   09/20/17 1438 09/21/17 0330  BP: 139/83 (!) 155/101  Pulse: 76 72  Resp: 16 18  Temp: 98.5 F (36.9 C) 98.5 F (36.9 C)  SpO2: 100% 99%   11.Leukocytosis.   UTI  ,improved  WBC  11.3 on 10/31 12. ABLA  Hb 8.4 on 10/17, improved to 9.1 on 10/31 Cont to monitor   LOS (Days) 20 A FACE TO FACE EVALUATION WAS PERFORMED  KIRSTEINS,ANDREW E 09/21/2017, 8:11 AM

## 2017-09-21 NOTE — Plan of Care (Signed)
Pt remembering to call for assistance with ambulation to bathroom.

## 2017-09-21 NOTE — Progress Notes (Signed)
Physical Therapy Discharge Summary  Patient Details  Name: Suzanne Brooks MRN: [id number] Date of Birth: 12-26-1975  Today's Date: 09/21/2017      Patient has met 7 of 7 long term goals due to improved activity tolerance, improved balance, improved postural control, improved attention, improved awareness and improved coordination.  Patient to discharge at an ambulatory level Supervision.   Patient's care partner is independent to provide the necessary physical assistance at discharge.  Reasons goals not met: N/A  Recommendation:  Patient will benefit from ongoing skilled PT services in home health setting to continue to advance safe functional mobility, address ongoing impairments in balance, safety, and minimize fall risk.  Equipment: No equipment provided  Reasons for discharge: treatment goals met  Patient/family agrees with progress made and goals achieved: Yes  PT Discharge Precautions/Restrictions   Vital Signs Therapy Vitals Pulse Rate: 91 BP: 124/84 Pain   Vision/Perception     Cognition Overall Cognitive Status: Impaired/Different from baseline Arousal/Alertness: Awake/alert Orientation Level: Oriented X4 Attention: Sustained Focused Attention: Appears intact Sustained Attention: Impaired Sustained Attention Impairment: Verbal complex;Functional complex Memory Impairment: Storage deficit;Retrieval deficit;Decreased short term memory Decreased Short Term Memory: Verbal basic;Functional basic Awareness: Impaired Awareness Impairment: Intellectual impairment Problem Solving: Impaired Problem Solving Impairment: Verbal complex;Functional complex Executive Function: (All areas impacted by lower level deficits) Behaviors: Restless Safety/Judgment: Impaired Comments: Decreased awareness of deficits Sensation   Motor  Motor Motor: Hemiplegia;Motor impersistence;Abnormal postural alignment and control  Mobility Bed Mobility Bed Mobility: Rolling Right;Rolling  Left;Supine to Sit;Sit to Supine Rolling Right: 6: Modified independent (Device/Increase time) Rolling Left: 6: Modified independent (Device/Increase time) Supine to Sit: 6: Modified independent (Device/Increase time) Sit to Supine: 6: Modified independent (Device/Increase time) Transfers Sit to Stand: 5: Supervision Locomotion  Ambulation Ambulation: Yes Ambulation/Gait Assistance: 5: Supervision Ambulation Distance (Feet): 500 Feet Gait Gait: Yes Gait Pattern: Impaired Gait Pattern: Right foot flat;Poor foot clearance - right Stairs / Additional Locomotion Stairs: Yes Stairs Assistance: 5: Supervision Stair Management Technique: Two rails Number of Stairs: 12  Trunk/Postural Assessment  Cervical Assessment Cervical Assessment: Within Functional Limits Thoracic Assessment Thoracic Assessment: Exceptions to WFL(kyphotic) Lumbar Assessment Lumbar Assessment: Exceptions to WFL(posterior pelvic tilt) Postural Control Postural Control: Within Functional Limits  Balance Balance Balance Assessed: Yes Static Sitting Balance Static Sitting - Balance Support: Feet supported Static Sitting - Level of Assistance: 7: Independent Dynamic Sitting Balance Dynamic Sitting - Balance Support: Feet supported;During functional activity Dynamic Sitting - Level of Assistance: 6: Modified independent (Device/Increase time) Sitting balance - Comments: Sitting EOB Static Standing Balance Static Standing - Balance Support: No upper extremity supported;During functional activity Static Standing - Level of Assistance: 6: Modified independent (Device/Increase time) Dynamic Standing Balance Dynamic Standing - Balance Support: During functional activity;No upper extremity supported Dynamic Standing - Level of Assistance: 5: Stand by assistance Extremity Assessment      RLE Assessment RLE Assessment: Exceptions to [facility] RLE Strength RLE Overall Strength Comments: Grossly 4/5 DF 1/5 LLE  Assessment LLE Assessment: Within Functional Limits   See Function Navigator for Current Functional Status.  Rosita DeChalus 09/21/2017, 9:46 AM

## 2017-09-22 ENCOUNTER — Other Ambulatory Visit: Payer: Self-pay

## 2017-09-22 MED ORDER — AMLODIPINE BESYLATE 5 MG PO TABS: 5.0000 mg | ORAL_TABLET | Freq: Every day | ORAL | 0 refills | Status: DC

## 2017-09-22 MED ORDER — LABETALOL HCL 300 MG PO TABS: 300.0000 mg | ORAL_TABLET | Freq: Two times a day (BID) | ORAL | 0 refills | Status: DC

## 2017-09-22 MED ORDER — LEVETIRACETAM 500 MG PO TABS: 500.0000 mg | ORAL_TABLET | Freq: Two times a day (BID) | ORAL | 0 refills | Status: DC

## 2017-09-22 MED ORDER — ADULT MULTIVITAMIN W/MINERALS CH: 1.0000 | ORAL_TABLET | Freq: Every day | ORAL | Status: AC

## 2017-09-22 MED ORDER — RANITIDINE HCL 150 MG PO TABS: 150.0000 mg | ORAL_TABLET | Freq: Two times a day (BID) | ORAL | 0 refills | Status: AC

## 2017-09-22 NOTE — Progress Notes (Signed)
Social Work Discharge Note  The overall goal for the admission was met for:   Discharge location: Yes - home to her mother's home  Length of Stay: Yes - 21 days  Discharge activity level: Yes - supervision  Home/community participation: Yes  Services provided included: MD, RD, PT, OT, SLP, RN, Pharmacy and SW  Financial Services: Medicaid  Follow-up services arranged: Home Health: PT/OT/ST from [place], DME: tub seat from [facility] and Patient/Family has no preference for HH/DME agencies  Comments (or additional information): Pt went to her mother's home where she can provide 24/7 supervision.  Patient/Family verbalized understanding of follow-up arrangements: Yes  Individual responsible for coordination of the follow-up plan: pt's mother  Confirmed correct DME delivered: Trey Sailors 09/22/2017    Carmine Carrozza, Silvestre Mesi

## 2017-09-22 NOTE — Progress Notes (Signed)
Social Work Patient ID: Suzanne CatenaChristie Brooks, female   DOB: [birth year], 41 y.o.   MRN: [id number]  Attempted to contact [pt's mom-Diana via new phone number this number was not taking calls. Made aware of discharge date last week. Will have Jenny-LCSW contact once in.

## 2017-09-22 NOTE — Progress Notes (Signed)
Subjective/Complaints: No issues overnite, aware of d/c , no pains or c/os this am, family will be here about 11a ROS: pt denies nausea, vomiting, diarrhea, cough, shortness of breath or chest pain   Objective: Vital Signs: Blood pressure (!) 165/98, pulse 92, temperature 98.5 F (36.9 C), temperature source Oral, resp. rate 17, height 5\' 3"  (1.6 m), weight 59.2 kg (130 lb 9.3 oz), SpO2 100 %. No results found. Results for orders placed or performed during the hospital encounter of 09/01/17 (from the past 72 hour(s))  CBC     Status: Abnormal   Collection Time: 09/21/17 10:45 AM  Result Value Ref Range   WBC 11.2 (H) 4.0 - 10.5 K/uL   RBC 3.43 (L) 3.87 - 5.11 MIL/uL   Hemoglobin 9.2 (L) 12.0 - 15.0 g/dL   HCT 09.829.3 (L) 11.936.0 - 14.746.0 %   MCV 85.4 78.0 - 100.0 fL   MCH 26.8 26.0 - 34.0 pg   MCHC 31.4 30.0 - 36.0 g/dL   RDW 82.919.2 (H) 56.211.5 - 13.015.5 %   Platelets 244 150 - 400 K/uL     HEENT: Staples over ventriculostomy site and crani site, well healed. +NGT Cardio: RRR without murmur. No JVD   Resp: CTA Bilaterally without wheezes or rales. Normal effort   GI: BS positive, non tender Skin:   Other RIght upper chest wound from tunneled cath, CDI Neuro: fairly alert, delayed processing Nonverbal.  Motor 4+/5 B delt, bi, tri, grip  RLE: 3-/5 HF, KE Ankle PF, 0 ankle DF---unchanged Musc/Skel:  No edema. No tenderness. Gen NAD. Vital signs reviewed. Psych: Nonverbal.  Assessment/Plan: 1. Functional deficits secondary to Right periopthalmic SAH with cognitive deficits, Left frontal IPH and gait disorder,RLE >LLE weakness  Stable for D/C today F/u PCP in 3-4 weeks F/u NS 1 wk F/u PM&R 2 weeks See D/C summary See D/C instructionsFIM: Function - Bathing Position: Shower Body parts bathed by patient: Right arm, Left arm, Chest, Abdomen, Front perineal area, Buttocks, Right upper leg, Left upper leg, Left lower leg, Right lower leg, Back Body parts bathed by helper: Back Assist Level:  Supervision or verbal cues  Function- Upper Body Dressing/Undressing What is the patient wearing?: Pull over shirt/dress Pull over shirt/dress - Perfomed by patient: Thread/unthread right sleeve, Thread/unthread left sleeve, Put head through opening, Pull shirt over trunk Pull over shirt/dress - Perfomed by helper: Put head through opening, Pull shirt over trunk Assist Level: Supervision or verbal cues Function - Lower Body Dressing/Undressing What is the patient wearing?: Pants, Shoes, Socks, Underwear Position: Sitting EOB Underwear - Performed by patient: Thread/unthread right underwear leg, Thread/unthread left underwear leg, Pull underwear up/down Pants- Performed by patient: Thread/unthread right pants leg, Thread/unthread left pants leg, Pull pants up/down Pants- Performed by helper: Thread/unthread left pants leg, Pull pants up/down Non-skid slipper socks- Performed by patient: Don/doff right sock, Don/doff left sock Non-skid slipper socks- Performed by helper: Don/doff right sock, Don/doff left sock Socks - Performed by patient: Don/doff right sock, Don/doff left sock Shoes - Performed by patient: Don/doff right shoe, Don/doff left shoe, Fasten right, Fasten left Shoes - Performed by helper: Don/doff right shoe, Don/doff left shoe, Fasten right, Fasten left AFO - Performed by patient: Don/doff right AFO AFO - Performed by helper: Don/doff right AFO Assist for footwear: Supervision/touching assist Assist for lower body dressing: Supervision or verbal cues  Function - Toileting Toileting activity did not occur: Safety/medical concerns Toileting steps completed by patient: Adjust clothing prior to toileting, Performs perineal hygiene, Adjust  clothing after toileting Toileting steps completed by helper: Adjust clothing prior to toileting, Performs perineal hygiene, Adjust clothing after toileting Toileting Assistive Devices: Grab bar or rail Assist level: Supervision or verbal  cues  Function - ArchivistToilet Transfers Toilet transfer activity did not occur: Safety/medical concerns Toilet transfer assistive device: Grab bar Mechanical lift: Stedy Assist level to toilet: Supervision or verbal cues Assist level from toilet: Supervision or verbal cues Assist level to bedside commode (at bedside): Touching or steadying assistance (Pt > 75%) Assist level from bedside commode (at bedside): Touching or steadying assistance (Pt > 75%)  Function - Chair/bed transfer Chair/bed transfer method: Ambulatory Chair/bed transfer assist level: Supervision or verbal cues Chair/bed transfer assistive device: Armrests Chair/bed transfer details: Verbal cues for safe use of DME/AE  Function - Locomotion: Wheelchair Type: Manual Max wheelchair distance: 175ft Assist Level: Touching or steadying assistance (Pt > 75%) Wheel 50 feet with 2 turns activity did not occur: Refused Assist Level: Touching or steadying assistance (Pt > 75%) Wheel 150 feet activity did not occur: Safety/medical concerns Turns around,maneuvers to table,bed, and toilet,negotiates 3% grade,maneuvers on rugs and over doorsills: No Function - Locomotion: Ambulation Ambulation activity did not occur: Safety/medical concerns Assistive device: No device Max distance: 26500ft  Assist level: Supervision or verbal cues Walk 10 feet activity did not occur: Safety/medical concerns Assist level: Supervision or verbal cues Assist level: Supervision or verbal cues Walk 150 feet activity did not occur: Safety/medical concerns Assist level: Supervision or verbal cues Walk 10 feet on uneven surfaces activity did not occur: Safety/medical concerns Assist level: Supervision or verbal cues  Function - Comprehension Comprehension: Auditory Comprehension assist level: Follows basic conversation/direction with extra time/assistive device  Function - Expression Expression: Verbal Expression assist level: Expresses basic 90% of the  time/requires cueing < 10% of the time.  Function - Social Interaction Social Interaction assist level: Interacts appropriately 90% of the time - Needs monitoring or encouragement for participation or interaction.  Function - Problem Solving Problem solving assist level: Solves basic 75 - 89% of the time/requires cueing 10 - 24% of the time  Function - Memory Memory assist level: Recognizes or recalls 75 - 89% of the time/requires cueing 10 - 24% of the time Patient normally able to recall (first 3 days only): Current season, Staff names and faces, That he or she is in a hospital  Medical Problem List and Plan: 1. 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 DVT Prophylaxis/Anticoagulation: SCDs 3. Pain Management: Tylenol as needed 4. Mood: Provide emotional support  More alert overall    5. Neuropsych: This patient is notcapable of making decisions on herown behalf.  Fall safety precautions---still with poor safety awareness  6. Skin/Wound Care: Routine skin checks 7. Fluids/Electrolytes/Nutrition: Routine I&Os,D/C megace, appetite improved 8.Seizure disorder.Keppra 1000 mg twice a day, no seizure since rehabilitation admission 9.Dysphagia. Dysphagia #2 thin liquids/nasogastric tube feeds to supplement---remove when able to adequately meet nutritional needs--follow daily I's and O's. 10.Hypertension.  -DBP elevated particularly. ---increased labetalol to 300mg  BID on 10/27 -check bp's q shift and with therapy.   -may need further titration, add amlodipine 2.5mg  10/29 Still elevated 11/5, increased amlodipine to 5 mg on 11/3, will need PCP f/u after d/c   Vitals:   09/21/17 1544 09/22/17 0549  BP: (!) 144/97 (!) 165/98  Pulse:  92  Resp:  17  Temp:  98.5 F (36.9 C)  SpO2:  100%    11.Leukocytosis.   UTI  ,improved  WBC  11.3 on 10/31 12. ABLA  Hb 8.4 on 10/17, improved to 9.1 on 10/31 Cont to monitor   LOS (Days) 21 A FACE TO FACE EVALUATION WAS PERFORMED  Madia Carvell E 09/22/2017, 7:31 AM

## 2017-09-22 NOTE — Progress Notes (Signed)
Recreational Therapy Discharge Summary Patient Details  Name: Suzanne Brooks MRN: [id number] Date of Birth: [birth year] Today's Date: 09/22/2017  Comments on progress toward goals: Pt is scheduled for discharge home today with family to provide supervision/assistance.  Pt participated in TR group with focus on activity tolerance, dynamic standing balance, energy conservation, cognition and safety during kitchen task.  Goals met.  Reasons for discharge: discharge from hospital Patient/family agrees with progress made and goals achieved: Yes  Jennings Corado 09/22/2017, 8:58 AM

## 2017-10-02 ENCOUNTER — Other Ambulatory Visit: Payer: Self-pay | Admitting: *Deleted

## 2017-10-02 NOTE — Patient Outreach (Signed)
[facility] Care Management  10/02/2017  Suzanne CatenaChristie Ting 1976-09-21 [id number]  EMMI-Stroke -Red Alert -Day#9-11/15/2018 Reason-Sad-yes  Telephone call to patient; daughter answered call & advised that patient had been out of area for abouts 2 weeks & would not be able to take automated calls anymore.  States she is requesting that calls be stopped.  States she needs to cancel home health visits & was not sure what agency to call.  Daughter was given agency name & contact number.   Plan: Send to care management assistant to close case & stop EMMI-Stroke calls.  Colleen CanLinda Marzetta Lanza, RN BSN CCM Care Management Coordinator [facility]  [phone]

## 2017-10-20 ENCOUNTER — Encounter: Payer: Medicaid Other | Attending: Physical Medicine & Rehabilitation

## 2017-10-20 ENCOUNTER — Inpatient Hospital Stay: Payer: Medicaid Other | Admitting: Physical Medicine & Rehabilitation

## 2018-01-29 ENCOUNTER — Encounter: Payer: Medicaid Other | Attending: Physical Medicine & Rehabilitation

## 2018-01-29 ENCOUNTER — Ambulatory Visit (HOSPITAL_BASED_OUTPATIENT_CLINIC_OR_DEPARTMENT_OTHER): Payer: Medicaid Other | Admitting: Physical Medicine & Rehabilitation

## 2018-01-29 ENCOUNTER — Encounter: Payer: Self-pay | Admitting: Physical Medicine & Rehabilitation

## 2018-01-29 VITALS — BP 201/132 | HR 97

## 2018-01-29 DIAGNOSIS — R569 Unspecified convulsions: Secondary | ICD-10-CM | POA: Diagnosis present

## 2018-01-29 DIAGNOSIS — I10 Essential (primary) hypertension: Secondary | ICD-10-CM

## 2018-01-29 DIAGNOSIS — Z9114 Patient's other noncompliance with medication regimen: Secondary | ICD-10-CM | POA: Diagnosis not present

## 2018-01-29 DIAGNOSIS — I69051 Hemiplegia and hemiparesis following nontraumatic subarachnoid hemorrhage affecting right dominant side: Secondary | ICD-10-CM | POA: Diagnosis not present

## 2018-01-29 DIAGNOSIS — I610 Nontraumatic intracerebral hemorrhage in hemisphere, subcortical: Secondary | ICD-10-CM

## 2018-01-29 MED ORDER — AMLODIPINE BESYLATE 5 MG PO TABS: 5.0000 mg | ORAL_TABLET | Freq: Every day | ORAL | 1 refills | Status: AC

## 2018-01-29 MED ORDER — LEVETIRACETAM 500 MG PO TABS: 500.0000 mg | ORAL_TABLET | Freq: Two times a day (BID) | ORAL | 1 refills | Status: AC

## 2018-01-29 MED ORDER — LABETALOL HCL 300 MG PO TABS: 300.0000 mg | ORAL_TABLET | Freq: Two times a day (BID) | ORAL | 1 refills | Status: AC

## 2018-01-29 NOTE — Progress Notes (Signed)
Subjective:    Patient ID: Suzanne Brooks, female    DOB: 15-Oct-1976, 42 y.o.   MRN: [id number] 42 year old right-handed female with history of hypertension as well as reported seizures, on no prescription medications at the time of admission.  Independent prior to admission.  Presented on July 30, 2017, with seizure requiring intubation.  Cranial CT scan showed diffuse subarachnoid hemorrhage.  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 Conchita ParisNundkumar. Followup serial CT scan showed progressive ventriculomegaly.  Required right frontal ventriculostomy on July 31, 2017.  Continuous EEG showed generalized polymorphic delta slowing suggesting severe encephalopathy, no seizure.  DATE OF ADMISSION:  09/01/2017 DATE OF DISCHARGE:  09/22/2017   HPI Back home after incarceration for 3 mo was taking BP meds.  Has been out of BP meds since release from jail. Right foot drag improved Standing tolerance reduced but overall improved since released from hospital.  She has not followed up with her primary care physician or any other physicians since release from hospital or jail.  Patient does not take her blood pressure at home.  She denies any headaches or dizziness. Patient denies any seizure history since released from hospital Pain Inventory Average Pain 6 Pain Right Now 5 My pain is sharp and aching  In the last 24 hours, has pain interfered with the following? General activity 5 Relation with others 3 Enjoyment of life 3 What TIME of day is your pain at its worst? all Sleep (in general) Good  Pain is worse with: walking, standing and some activites Pain improves with: rest Relief from Meds: .  Mobility walk without assistance  Function not employed: date last employed . I need assistance with the following:  meal prep, household duties and shopping  Neuro/Psych trouble walking  Prior Studies Any changes since last  visit?  no  Physicians involved in your care Any changes since last visit?  no   Family History  Problem Relation Age of Onset  . Stroke Maternal Aunt   . Aneurysm Maternal Aunt   . Aneurysm Cousin        died after aneurysm   Social History   Socioeconomic History  . Marital status: Single    Spouse name: None  . Number of children: None  . Years of education: None  . Highest education level: None  Social Needs  . Financial resource strain: None  . Food insecurity - worry: None  . Food insecurity - inability: None  . Transportation needs - medical: None  . Transportation needs - non-medical: None  Occupational History  . None  Tobacco Use  . Smoking status: Never Smoker  . Smokeless tobacco: Never Used  Substance and Sexual Activity  . Alcohol use: Yes    Comment: occasional  . Drug use: Yes    Types: Marijuana  . Sexual activity: None  Other Topics Concern  . None  Social History Narrative  . None   Past Surgical History:  Procedure Laterality Date  . IR 3D INDEPENDENT WKST  07/30/2017  . IR ANGIO INTRA EXTRACRAN SEL INTERNAL CAROTID BILAT MOD SED  07/30/2017  . IR ANGIO VERTEBRAL SEL VERTEBRAL UNI R MOD SED  07/30/2017  . IR ANGIOGRAM FOLLOW UP STUDY  07/30/2017  . IR ANGIOGRAM FOLLOW UP STUDY  07/30/2017  . IR ANGIOGRAM FOLLOW UP STUDY  07/30/2017  . IR TRANSCATH/EMBOLIZ  07/30/2017  . LAPAROSCOPIC REVISION VENTRICULAR-PERITONEAL (V-P) SHUNT N/A 08/27/2017   Procedure: LAPAROSCOPIC REVISION VENTRICULAR-PERITONEAL (V-P) SHUNT;  Surgeon: Axel Filler, MD;  Location: [facility];  Service: General;  Laterality: N/A;  . RADIOLOGY WITH ANESTHESIA N/A 07/30/2017   Procedure: RADIOLOGY WITH ANESTHESIA/ DR. Gerlene Burdock;  Surgeon: Radiologist, Medication, MD;  Location: [facility] OR;  Service: Radiology;  Laterality: N/A;  . VENTRICULOPERITONEAL SHUNT N/A 08/27/2017   Procedure: SHUNT INSERTION VENTRICULAR-PERITONEAL;  Surgeon: Lisbeth Renshaw, MD;  Location: [facility] OR;  Service:  Neurosurgery;  Laterality: N/A;   Past Medical History:  Diagnosis Date  . Hypertension   . Seizures (HCC)    There were no vitals taken for this visit.  Opioid Risk Score:   Fall Risk Score:  `1  Depression screen PHQ 2/9  No flowsheet data found.   Review of Systems  Constitutional: Negative.   HENT: Negative.   Eyes: Negative.   Respiratory: Negative.   Cardiovascular: Negative.        High blood pressure  Gastrointestinal: Negative.   Endocrine: Negative.   Genitourinary: Negative.   Musculoskeletal: Positive for arthralgias and myalgias.  Skin: Negative.   Allergic/Immunologic: Negative.   Neurological: Negative.   Hematological: Negative.   Psychiatric/Behavioral: Negative.   All other systems reviewed and are negative.      Objective:   Physical Exam  Constitutional: She appears well-developed and well-nourished. No distress.  HENT:  Head: Normocephalic.  Eyes: Conjunctivae and EOM are normal. Pupils are equal, round, and reactive to light.  Neck: Normal range of motion. Neck supple.  Cardiovascular: Normal rate, regular rhythm, normal heart sounds and intact distal pulses. Exam reveals no friction rub.  No murmur heard. Pulmonary/Chest: Effort normal and breath sounds normal. No respiratory distress. She has no wheezes.  Abdominal: Soft. Bowel sounds are normal. She exhibits no distension. There is no tenderness.  Skin: She is not diaphoretic.  Nursing note and vitals reviewed.  5/5 strength in bilateral deltoid bicep tricep grip hip flexor knee extensor and left ankle dorsiflexor, right ankle dorsiflexor is 3/5 Ambulates without evidence of knee instability there is mild toe drag on the right side compared to left side  Sensation equal bilateral upper limbs.  No evidence of facial droop     Assessment & Plan:  #1.  History of right hemiparesis following subarachnoid hemorrhage, history of left ophthalmic artery aneurysm, status post coiling with Dr.  Conchita Paris  Recommend follow-up with neurosurgery.  Recommend restarting Keppra 500 mg twice daily.  She can ask neurosurgery the duration of her anti-epileptic treatment.  2.  Hypertension noncompliant with medication, she was well controlled in the hospital.  Will start her on the medications that she was discharged on namely amlodipine 5 mg/day and Normodyne 300 mg twice daily.  We discussed the importance of getting back in with her primary care physician.  We discussed that I am a specialist and not a primary care physician.  She does have a physician that she sees in [place]. Physical medicine rehab follow-up on as-needed basis.  She is made a good recovery in terms of her stroke.  Her major concerns are as listed above.

## 2018-01-29 NOTE — Patient Instructions (Addendum)
Follow up with your primary MD next month  See Dr Patric DykesNunkumar neurosurgeon, he can tell you how long to take seizure, Keppra

## 2018-07-21 ENCOUNTER — Other Ambulatory Visit: Payer: Self-pay | Admitting: Neurosurgery

## 2018-07-21 DIAGNOSIS — I6002 Nontraumatic subarachnoid hemorrhage from left carotid siphon and bifurcation: Secondary | ICD-10-CM

## 2018-08-06 IMAGING — DX DG ABD PORTABLE 1V
1 series · 1 of 1 positions shown · non-contrast
Comparison: 08/03/2017

CLINICAL DATA: Encounter for NG tube placement

EXAM:
PORTABLE ABDOMEN - 1 VIEW

[abdomen kub]
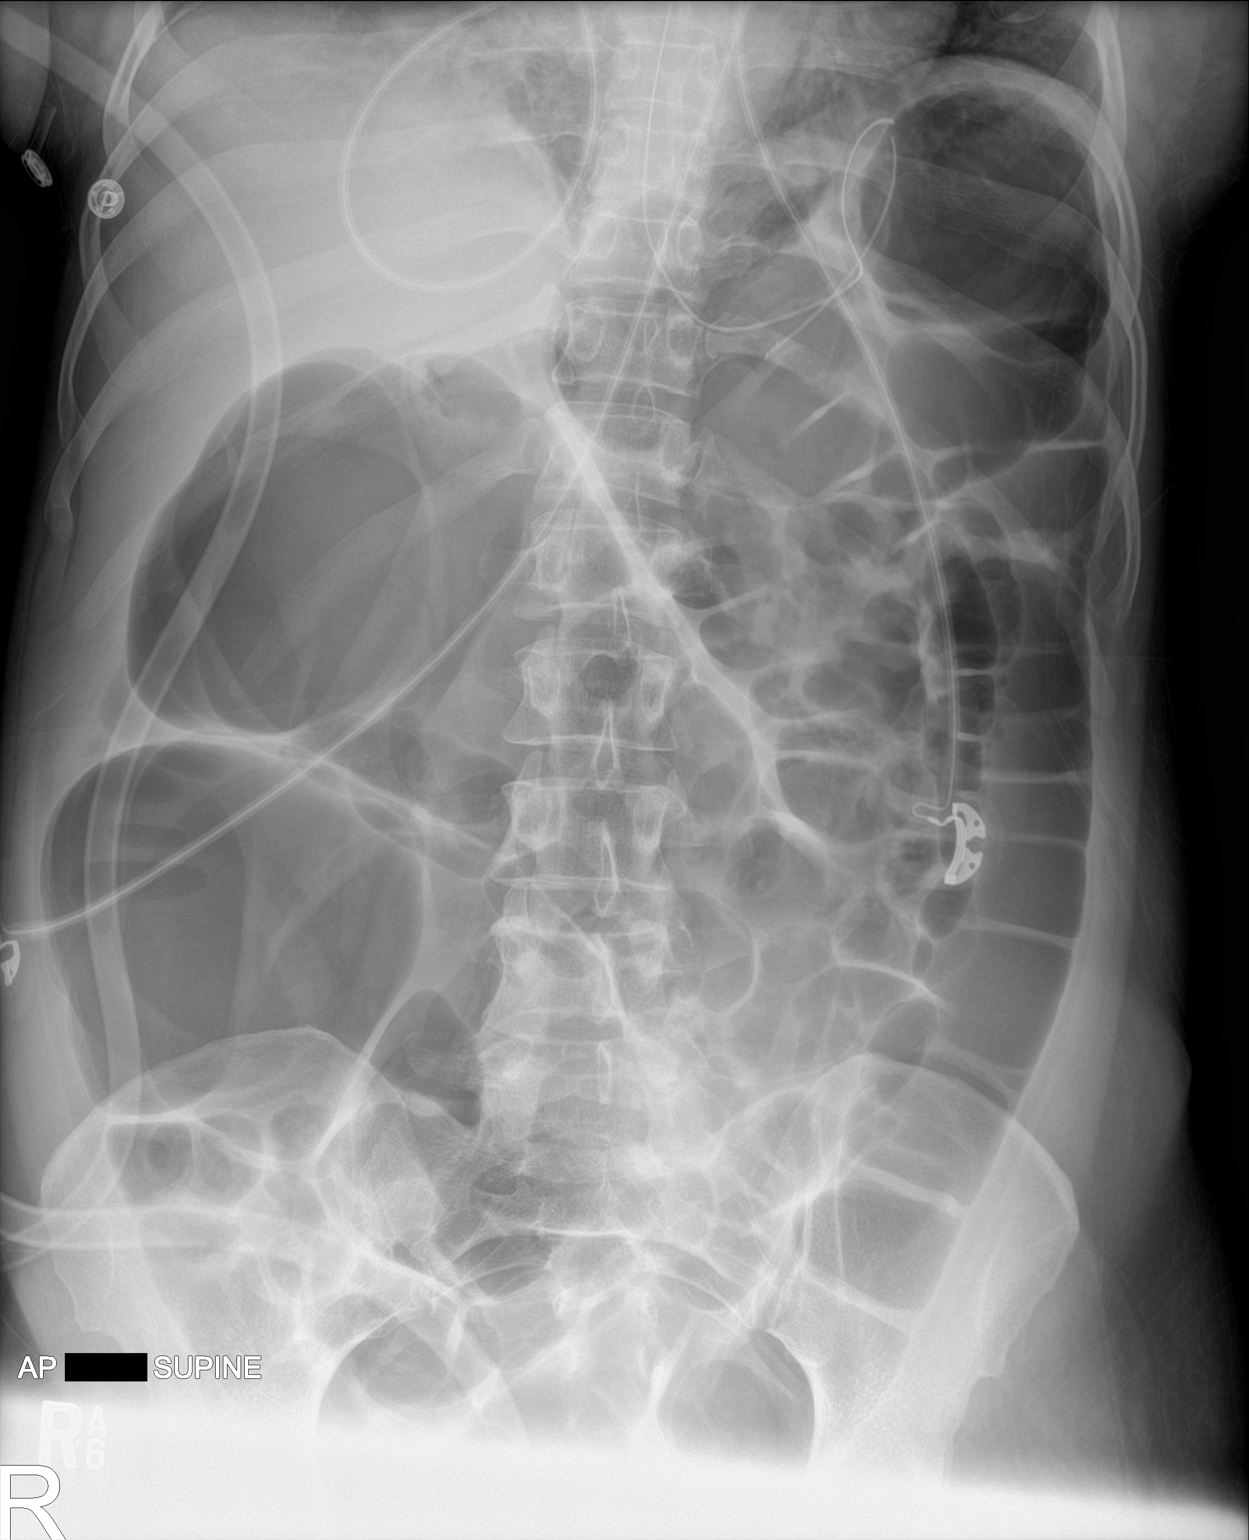

[1 of 1 positions shown; findings below may reference images not displayed]

FINDINGS: Severe diffuse gaseous distention of bowel, most pronounced within
the colon compatible with ileus. NG tube is in place with the tip in
the midportion of the stomach. No free air organomegaly.
IMPRESSION: NG tube tip in the mid stomach.

Continued marked gaseous distention of bowel, compatible with ileus.

## 2018-08-13 ENCOUNTER — Other Ambulatory Visit (HOSPITAL_COMMUNITY): Payer: Medicaid Other

## 2018-08-13 IMAGING — CT CT ANGIO HEAD
4 of 17 series · 10 of 47 positions shown · IV contrast (APPLIED)
Comparison: CT HEAD August 03, 2017

ADDENDUM:
Critical Value/emergent results were called by telephone at the time
of interpretation on 08/13/2017 at [DATE] to Dr. Veevo Magsi,
Neurosurgery, who verbally acknowledged these results.
CLINICAL DATA: Subarachnoid hemorrhage. Assess for intracranial
vasospasm. Status post coil embolization of LEFT paraophthalmic
aneurysm. History of seizures and hypertension.

EXAM:
CT ANGIOGRAPHY HEAD
TECHNIQUE: Multidetector CT imaging of the head was performed using the
standard protocol during bolus administration of intravenous
contrast. Multiplanar CT image reconstructions and MIPs were
obtained to evaluate the vascular anatomy.
CONTRAST:  50 cc Isovue 370

[Series 10: headangio 2.0 hr36 3 · axial · 0.41mm/px · z∈[-170,-54]mm · 3 of 118 slices shown]
[im 30/118  brain]
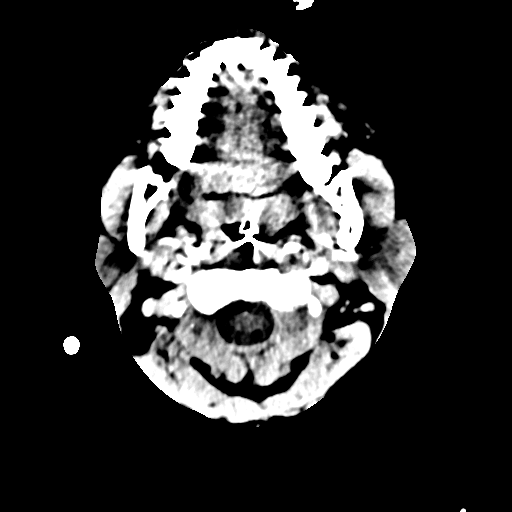
[im 59/118  bone]
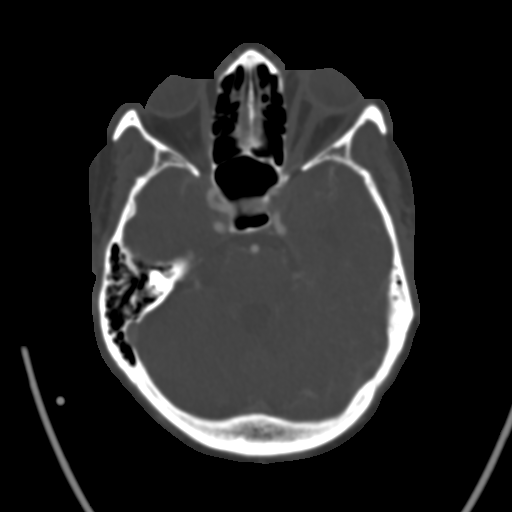
[im 88/118  brain]
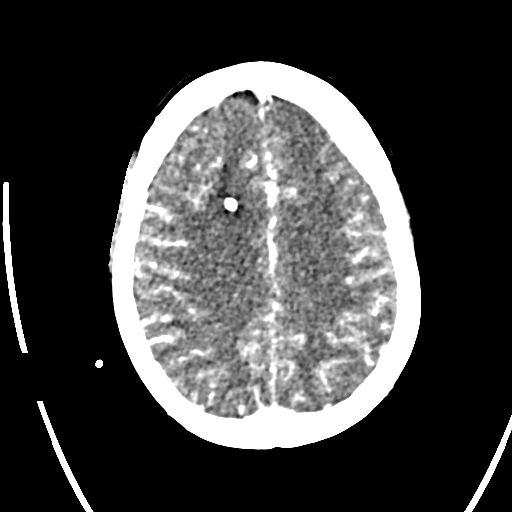

[Series 12: headangio 1.0 mpr cor · coronal · 0.36mm/px · 3 of 221 slices shown]
[im 56/221  brain]
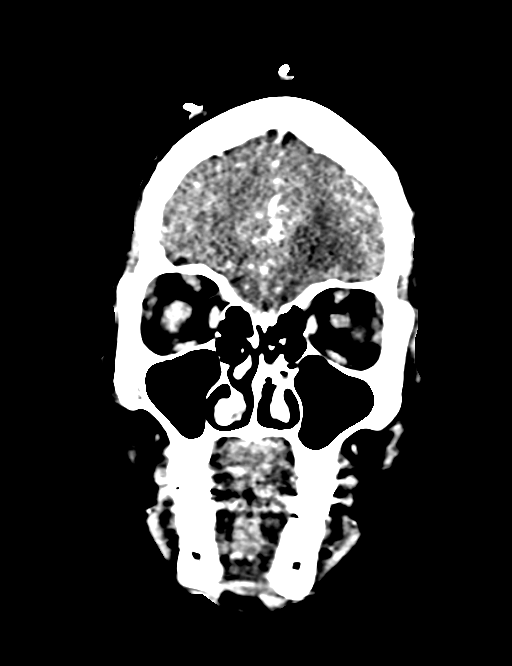
[im 111/221  brain]
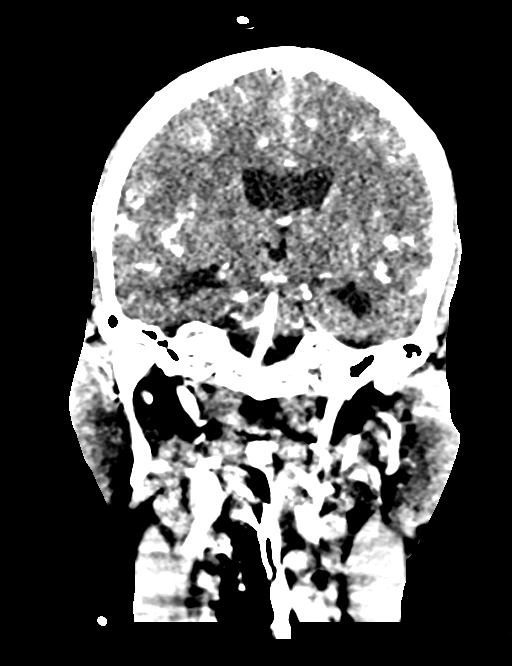
[im 166/221  brain]
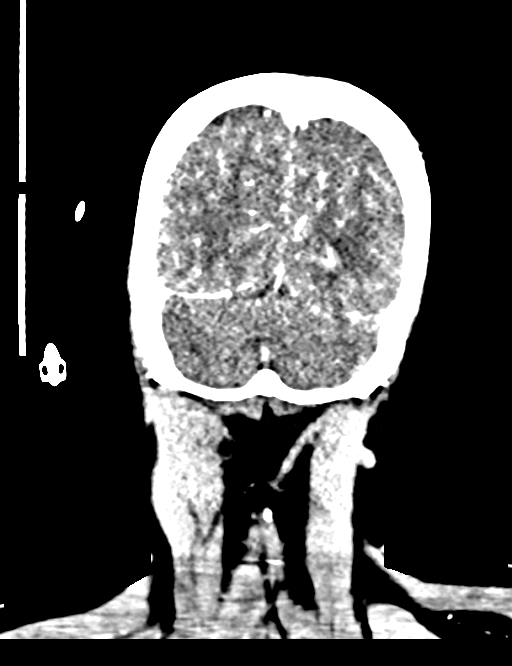

[Series 13: headangio 1.0 mpr sag · sagittal · 0.43mm/px · 2 of 189 slices shown]
[im 63/189  brain]
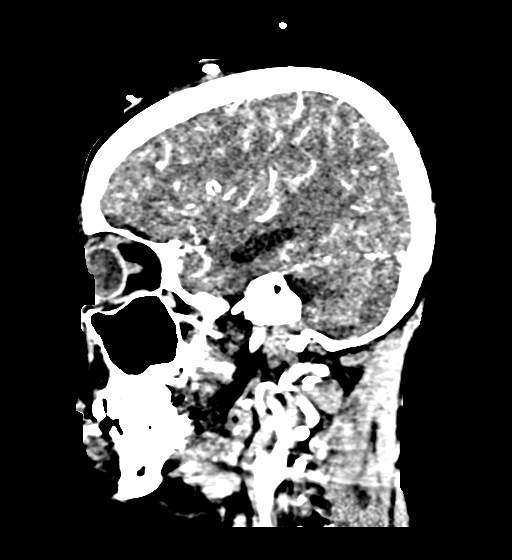
[im 126/189  brain]
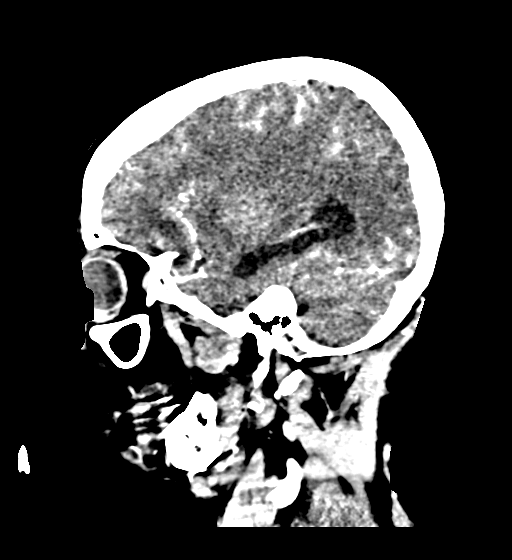

[Series 19: head bone · axial · 0.40mm/px · z∈[-106,-50]mm · 2 of 85 slices shown]
[im 29/85  bone]
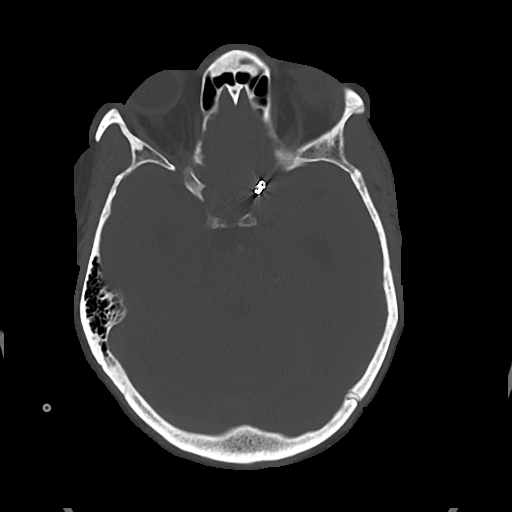
[im 57/85  bone]
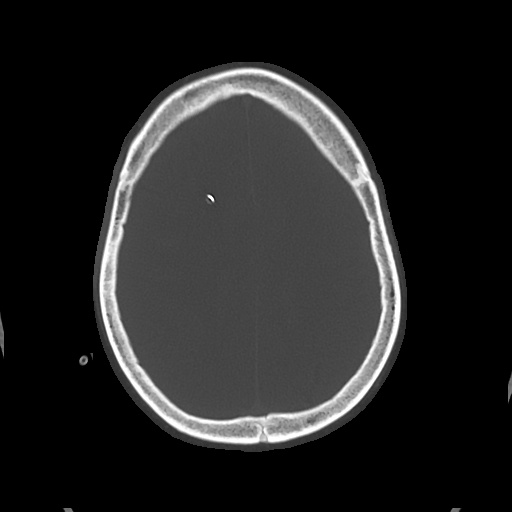

[10 of 47 positions shown; findings below may reference images not displayed]

FINDINGS: CT HEAD

BRAIN: Evolving LEFT inferior frontal lobe hemorrhage with
surrounding low-density vasogenic and cytotoxic edema. No new
hemorrhage. No acute large vascular territory infarct. Resolution of
subarachnoid hemorrhage. RIGHT frontal ventriculostomy catheter
distal tip at third ventricle. Mild hydrocephalus, increased from
prior imaging (anterior recess of third ventricle is 8 mm,
previously 5 mm ; LEFT temporal horn is 9 mm, previously 6 mm).
Residual intraventricular blood products dependent within the
occipital horns. Small amount of intraventricular pneumocephalus.
Gliosis along the catheter tract. Basal cisterns are patent.

VASCULAR: See below.

SKULL/SOFT TISSUES: No skull fracture. RIGHT frontal burr hole. No
significant soft tissue swelling.

ORBITS/SINUSES: The included ocular globes and orbital contents are
normal.The mastoid aircells and included paranasal sinuses are
well-aerated.

OTHER: None.

CTA HEAD

ANTERIOR CIRCULATION: Patent cervical internal carotid arteries,
petrous, cavernous and supra clinoid internal carotid arteries.
Severe stenosis LEFT supraclinoid internal carotid artery
immediately distal to coil pack. 3 mm intact RIGHT paraophthalmic
aneurysm. Severe tandem stenoses/luminal irregularity bilateral
middle cerebral artery's, and proximal anterior cerebral artery's.

POSTERIOR CIRCULATION: Patent vertebral arteries, vertebrobasilar
junction and basilar artery, as well as main branch vessels.
Moderate stenoses of the posterior cerebral artery's.

No large vessel occlusion, significant stenosis, contrast
extravasation or aneurysm.

VENOUS SINUSES: Major dural venous sinuses are patent though not
tailored for evaluation on this angiographic examination.

ANATOMIC VARIANTS: None.

DELAYED PHASE: Faint leptomeningeal enhancement consistent with
recent subarachnoid hemorrhage.

MIP images reviewed.
IMPRESSION: CT HEAD:

1. Slightly worsening mild hydrocephalus with ventriculoperitoneal
shunt in place. Residual intraventricular hemorrhage.
2. Evolving LEFT inferior frontal lobe hemorrhagic infarct.
3. Mild leptomeningeal enhancement presumably from recent
subarachnoid hemorrhage.
CTA HEAD:

1. Severe vasospasm of anterior greater than posterior circulation.
No emergent large vessel occlusion.
2. Status post LEFT paraophthalmic aneurysm coil embolization.
3. 3 mm RIGHT paraophthalmic aneurysm.

## 2018-08-13 IMAGING — DX DG CHEST 1V PORT
1 series · 1 of 1 positions shown · non-contrast
Comparison: 08/10/2017

CLINICAL DATA: Persistent fever

EXAM:
PORTABLE CHEST 1 VIEW

[chest ap]
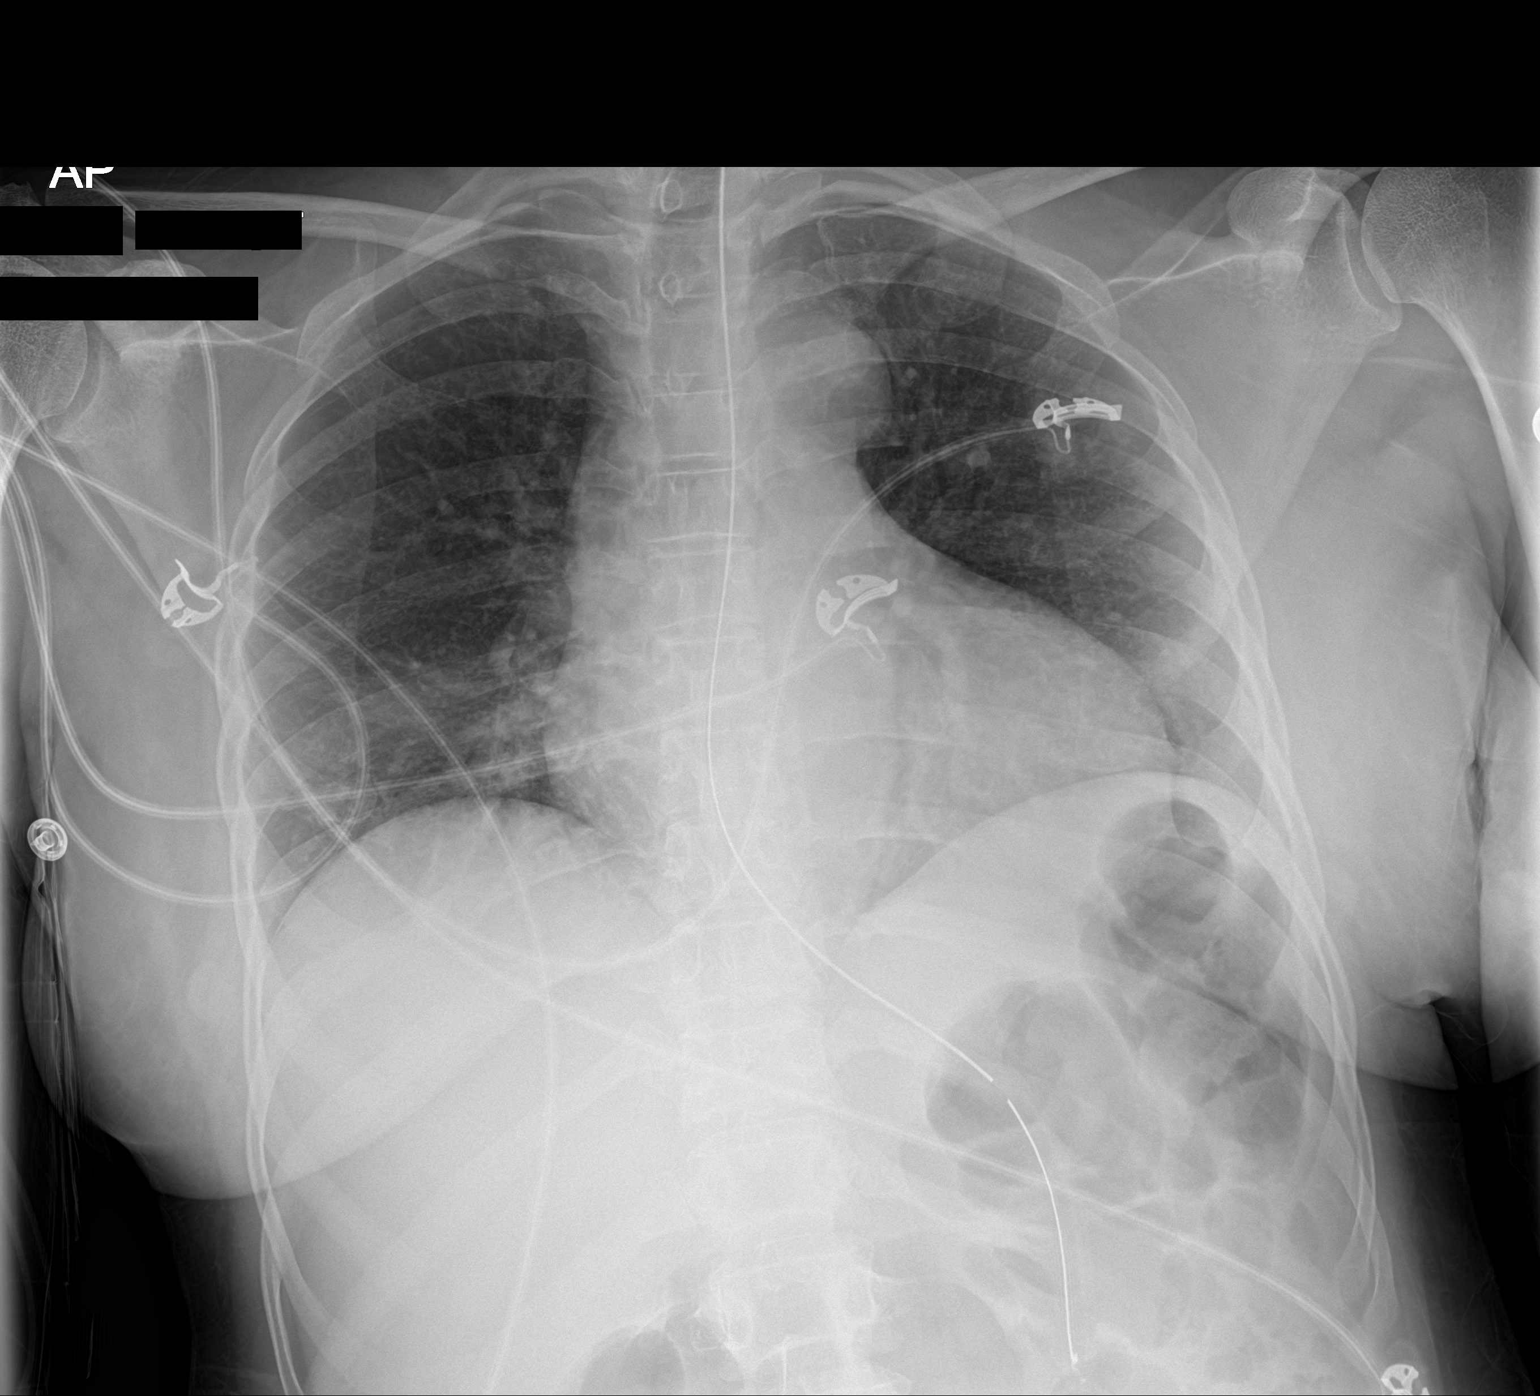

[1 of 1 positions shown; findings below may reference images not displayed]

FINDINGS: Cardiomegaly. NG tube is in the stomach. Minimal bibasilar densities
with decreased lung volumes, favor atelectasis. No effusions. No
acute bony abnormality.
IMPRESSION: Cardiomegaly. Decreasing lung volumes with minimal increase in
bibasilar densities, likely atelectasis.

## 2018-09-21 ENCOUNTER — Other Ambulatory Visit: Payer: Self-pay | Admitting: Physical Medicine & Rehabilitation

## 2018-10-04 ENCOUNTER — Other Ambulatory Visit: Payer: Self-pay | Admitting: Physical Medicine & Rehabilitation
